# Patient Record
Sex: Male | Born: 1950
Health system: Southern US, Community
[De-identification: ages and names within clinical notes are randomized; demographics above are authoritative.]

## PROBLEM LIST (undated history)

## (undated) DIAGNOSIS — G90519 Complex regional pain syndrome I of unspecified upper limb: Secondary | ICD-10-CM

## (undated) DIAGNOSIS — D126 Benign neoplasm of colon, unspecified: Secondary | ICD-10-CM

## (undated) DIAGNOSIS — K5792 Diverticulitis of intestine, part unspecified, without perforation or abscess without bleeding: Secondary | ICD-10-CM

## (undated) DIAGNOSIS — F329 Major depressive disorder, single episode, unspecified: Secondary | ICD-10-CM

## (undated) DIAGNOSIS — H409 Unspecified glaucoma: Secondary | ICD-10-CM

## (undated) DIAGNOSIS — K5790 Diverticulosis of intestine, part unspecified, without perforation or abscess without bleeding: Secondary | ICD-10-CM

## (undated) DIAGNOSIS — IMO0002 Reserved for concepts with insufficient information to code with codable children: Secondary | ICD-10-CM

## (undated) DIAGNOSIS — T7840XA Allergy, unspecified, initial encounter: Secondary | ICD-10-CM

## (undated) DIAGNOSIS — F419 Anxiety disorder, unspecified: Secondary | ICD-10-CM

## (undated) DIAGNOSIS — N289 Disorder of kidney and ureter, unspecified: Secondary | ICD-10-CM

## (undated) DIAGNOSIS — F32A Depression, unspecified: Secondary | ICD-10-CM

## (undated) DIAGNOSIS — N200 Calculus of kidney: Secondary | ICD-10-CM

## (undated) DIAGNOSIS — K3189 Other diseases of stomach and duodenum: Secondary | ICD-10-CM

## (undated) DIAGNOSIS — E785 Hyperlipidemia, unspecified: Secondary | ICD-10-CM

## (undated) DIAGNOSIS — E119 Type 2 diabetes mellitus without complications: Secondary | ICD-10-CM

## (undated) DIAGNOSIS — Z8619 Personal history of other infectious and parasitic diseases: Secondary | ICD-10-CM

## (undated) DIAGNOSIS — K219 Gastro-esophageal reflux disease without esophagitis: Secondary | ICD-10-CM

## (undated) DIAGNOSIS — I10 Essential (primary) hypertension: Secondary | ICD-10-CM

## (undated) DIAGNOSIS — K31A0 Gastric intestinal metaplasia, unspecified: Secondary | ICD-10-CM

## (undated) HISTORY — DX: Hyperlipidemia, unspecified: E78.5

## (undated) HISTORY — DX: Diverticulosis of intestine, part unspecified, without perforation or abscess without bleeding: K57.90

## (undated) HISTORY — DX: Complex regional pain syndrome I of unspecified upper limb: G90.519

## (undated) HISTORY — DX: Benign neoplasm of colon, unspecified: D12.6

## (undated) HISTORY — DX: Other diseases of stomach and duodenum: K31.89

## (undated) HISTORY — DX: Gastro-esophageal reflux disease without esophagitis: K21.9

## (undated) HISTORY — DX: Personal history of other infectious and parasitic diseases: Z86.19

## (undated) HISTORY — DX: Allergy, unspecified, initial encounter: T78.40XA

## (undated) HISTORY — PX: ULNAR NERVE REPAIR: SHX2594

## (undated) HISTORY — DX: Gastric intestinal metaplasia, unspecified: K31.A0

## (undated) HISTORY — DX: Calculus of kidney: N20.0

## (undated) HISTORY — DX: Anxiety disorder, unspecified: F41.9

## (undated) HISTORY — DX: Reserved for concepts with insufficient information to code with codable children: IMO0002

## (undated) HISTORY — DX: Type 2 diabetes mellitus without complications: E11.9

## (undated) HISTORY — DX: Major depressive disorder, single episode, unspecified: F32.9

## (undated) HISTORY — DX: Unspecified glaucoma: H40.9

## (undated) HISTORY — DX: Depression, unspecified: F32.A

---

## 2002-09-25 HISTORY — PX: LITHOTRIPSY: SUR834

## 2006-12-25 DIAGNOSIS — IMO0002 Reserved for concepts with insufficient information to code with codable children: Secondary | ICD-10-CM

## 2006-12-25 HISTORY — DX: Reserved for concepts with insufficient information to code with codable children: IMO0002

## 2007-06-11 ENCOUNTER — Ambulatory Visit (HOSPITAL_COMMUNITY): Payer: Self-pay | Admitting: Psychiatry

## 2007-06-17 ENCOUNTER — Ambulatory Visit: Payer: Self-pay | Admitting: Family Medicine

## 2007-06-17 DIAGNOSIS — F329 Major depressive disorder, single episode, unspecified: Secondary | ICD-10-CM | POA: Insufficient documentation

## 2007-06-19 ENCOUNTER — Encounter: Payer: Self-pay | Admitting: Family Medicine

## 2007-06-24 ENCOUNTER — Encounter: Payer: Self-pay | Admitting: Family Medicine

## 2007-06-24 LAB — CONVERTED CEMR LAB
ALT: 20 units/L
AST: 18 units/L
Albumin: 4.2 g/dL
Alkaline Phosphatase: 87 units/L
BUN: 12 mg/dL
CO2: 24 meq/L
Chloride: 103 meq/L
Cholesterol: 181 mg/dL
Creatinine, Ser: 0.82 mg/dL
GFR calc non Af Amer: >60 mL/min
Glucose, Bld: 97 mg/dL
HDL: 30 mg/dL
PSA: 4.3 ng/mL
Potassium: 3.9 meq/L
Sodium: 140 meq/L
TSH: 2.54 microintl units/mL
Total Bilirubin: 0.8 mg/dL
Total Protein: 7.3 g/dL
Triglycerides: 407 mg/dL

## 2007-06-25 ENCOUNTER — Telehealth (INDEPENDENT_AMBULATORY_CARE_PROVIDER_SITE_OTHER): Payer: Self-pay | Admitting: *Deleted

## 2007-06-26 ENCOUNTER — Encounter: Payer: Self-pay | Admitting: Family Medicine

## 2007-06-28 ENCOUNTER — Ambulatory Visit: Payer: Self-pay | Admitting: Family Medicine

## 2007-06-28 DIAGNOSIS — E785 Hyperlipidemia, unspecified: Secondary | ICD-10-CM | POA: Insufficient documentation

## 2007-06-28 DIAGNOSIS — K7689 Other specified diseases of liver: Secondary | ICD-10-CM | POA: Insufficient documentation

## 2007-06-28 DIAGNOSIS — Q619 Cystic kidney disease, unspecified: Secondary | ICD-10-CM | POA: Insufficient documentation

## 2007-06-28 DIAGNOSIS — K760 Fatty (change of) liver, not elsewhere classified: Secondary | ICD-10-CM | POA: Insufficient documentation

## 2007-07-05 ENCOUNTER — Ambulatory Visit (HOSPITAL_COMMUNITY): Payer: Self-pay | Admitting: Psychiatry

## 2007-07-05 ENCOUNTER — Telehealth: Payer: Self-pay | Admitting: Family Medicine

## 2007-07-09 ENCOUNTER — Telehealth (INDEPENDENT_AMBULATORY_CARE_PROVIDER_SITE_OTHER): Payer: Self-pay | Admitting: *Deleted

## 2007-07-11 ENCOUNTER — Ambulatory Visit (HOSPITAL_COMMUNITY): Payer: Self-pay | Admitting: Psychiatry

## 2007-07-18 ENCOUNTER — Ambulatory Visit (HOSPITAL_COMMUNITY): Payer: Self-pay | Admitting: Psychiatry

## 2007-08-13 ENCOUNTER — Ambulatory Visit (HOSPITAL_COMMUNITY): Payer: Self-pay | Admitting: Psychiatry

## 2007-08-19 ENCOUNTER — Ambulatory Visit (HOSPITAL_COMMUNITY): Payer: Self-pay | Admitting: Psychiatry

## 2007-08-19 ENCOUNTER — Encounter: Payer: Self-pay | Admitting: Family Medicine

## 2007-08-20 LAB — CONVERTED CEMR LAB: PSA: 1.57 ng/mL (ref 0.10–4.00)

## 2007-09-11 ENCOUNTER — Ambulatory Visit (HOSPITAL_COMMUNITY): Payer: Self-pay | Admitting: Psychiatry

## 2007-10-09 ENCOUNTER — Ambulatory Visit (HOSPITAL_BASED_OUTPATIENT_CLINIC_OR_DEPARTMENT_OTHER): Admission: RE | Admit: 2007-10-09 | Discharge: 2007-10-09 | Payer: Self-pay | Admitting: Orthopedic Surgery

## 2007-10-23 ENCOUNTER — Ambulatory Visit (HOSPITAL_COMMUNITY): Payer: Self-pay | Admitting: Psychiatry

## 2008-01-14 ENCOUNTER — Ambulatory Visit: Payer: Self-pay | Admitting: Family Medicine

## 2008-01-14 DIAGNOSIS — J309 Allergic rhinitis, unspecified: Secondary | ICD-10-CM | POA: Insufficient documentation

## 2008-01-16 ENCOUNTER — Telehealth: Payer: Self-pay | Admitting: Family Medicine

## 2008-01-17 ENCOUNTER — Telehealth: Payer: Self-pay | Admitting: Family Medicine

## 2008-01-23 ENCOUNTER — Ambulatory Visit (HOSPITAL_COMMUNITY): Payer: Self-pay | Admitting: Psychiatry

## 2008-02-03 ENCOUNTER — Ambulatory Visit (HOSPITAL_COMMUNITY): Payer: Self-pay | Admitting: Psychology

## 2008-02-20 ENCOUNTER — Ambulatory Visit (HOSPITAL_COMMUNITY): Payer: Self-pay | Admitting: Psychiatry

## 2008-03-02 ENCOUNTER — Ambulatory Visit (HOSPITAL_COMMUNITY): Payer: Self-pay | Admitting: Psychology

## 2008-03-16 ENCOUNTER — Ambulatory Visit (HOSPITAL_COMMUNITY): Payer: Self-pay | Admitting: Psychology

## 2008-04-01 ENCOUNTER — Ambulatory Visit (HOSPITAL_COMMUNITY): Payer: Self-pay | Admitting: Psychology

## 2008-04-02 ENCOUNTER — Ambulatory Visit (HOSPITAL_COMMUNITY): Payer: Self-pay | Admitting: Psychiatry

## 2008-04-15 ENCOUNTER — Ambulatory Visit (HOSPITAL_COMMUNITY): Payer: Self-pay | Admitting: Psychology

## 2008-05-18 ENCOUNTER — Ambulatory Visit (HOSPITAL_COMMUNITY): Payer: Self-pay | Admitting: Psychology

## 2008-05-19 ENCOUNTER — Ambulatory Visit (HOSPITAL_COMMUNITY): Payer: Self-pay | Admitting: Psychiatry

## 2008-06-16 ENCOUNTER — Ambulatory Visit (HOSPITAL_COMMUNITY): Payer: Self-pay | Admitting: Psychology

## 2008-06-23 ENCOUNTER — Ambulatory Visit: Payer: Self-pay | Admitting: Family Medicine

## 2008-06-23 DIAGNOSIS — R51 Headache: Secondary | ICD-10-CM | POA: Insufficient documentation

## 2008-06-23 DIAGNOSIS — K219 Gastro-esophageal reflux disease without esophagitis: Secondary | ICD-10-CM | POA: Insufficient documentation

## 2008-06-23 DIAGNOSIS — R519 Headache, unspecified: Secondary | ICD-10-CM | POA: Insufficient documentation

## 2008-06-24 ENCOUNTER — Encounter: Payer: Self-pay | Admitting: Family Medicine

## 2008-06-25 LAB — CONVERTED CEMR LAB
ALT: 24 units/L (ref 0–53)
AST: 24 units/L (ref 0–37)
Albumin: 4.3 g/dL (ref 3.5–5.2)
Alkaline Phosphatase: 101 units/L (ref 39–117)
BUN: 12 mg/dL (ref 6–23)
CO2: 24 meq/L (ref 19–32)
Calcium: 9.2 mg/dL (ref 8.4–10.5)
Chloride: 103 meq/L (ref 96–112)
Cholesterol, target level: 200 mg/dL
Cholesterol: 209 mg/dL — ABNORMAL HIGH (ref 0–200)
Creatinine, Ser: 0.95 mg/dL (ref 0.40–1.50)
Glucose, Bld: 96 mg/dL (ref 70–99)
HCT: 48.2 % (ref 39.0–52.0)
HDL goal, serum: 40 mg/dL
HDL: 34 mg/dL — ABNORMAL LOW (ref >39)
Hemoglobin: 16.9 g/dL (ref 13.0–17.0)
LDL Cholesterol: 104 mg/dL — ABNORMAL HIGH (ref 0–99)
LDL Goal: 130 mg/dL
MCHC: 35.1 g/dL (ref 30.0–36.0)
MCV: 88.4 fL (ref 78.0–100.0)
PSA: 1.07 ng/mL (ref 0.10–4.00)
Platelets: 179 10*3/uL (ref 150–400)
Potassium: 4.3 meq/L (ref 3.5–5.3)
RBC: 5.45 M/uL (ref 4.22–5.81)
RDW: 15.4 % (ref 11.5–15.5)
Sodium: 140 meq/L (ref 135–145)
TSH: 2.121 microintl units/mL (ref 0.350–4.50)
Total Bilirubin: 1.2 mg/dL (ref 0.3–1.2)
Total CHOL/HDL Ratio: 6.1
Total Protein: 7 g/dL (ref 6.0–8.3)
Triglycerides: 357 mg/dL — ABNORMAL HIGH (ref <150)
VLDL: 71 mg/dL — ABNORMAL HIGH (ref 0–40)
WBC: 10.2 10*3/uL (ref 4.0–10.5)

## 2008-07-03 ENCOUNTER — Ambulatory Visit: Payer: Self-pay | Admitting: Family Medicine

## 2008-07-15 ENCOUNTER — Encounter: Admission: RE | Admit: 2008-07-15 | Discharge: 2008-07-15 | Payer: Self-pay | Admitting: Family Medicine

## 2008-07-15 ENCOUNTER — Ambulatory Visit: Payer: Self-pay | Admitting: Family Medicine

## 2008-07-15 ENCOUNTER — Ambulatory Visit (HOSPITAL_COMMUNITY): Payer: Self-pay | Admitting: Psychiatry

## 2008-07-19 ENCOUNTER — Encounter: Admission: RE | Admit: 2008-07-19 | Discharge: 2008-07-19 | Payer: Self-pay | Admitting: Family Medicine

## 2008-08-07 ENCOUNTER — Ambulatory Visit (HOSPITAL_COMMUNITY): Payer: Self-pay | Admitting: Psychiatry

## 2008-11-06 ENCOUNTER — Ambulatory Visit (HOSPITAL_COMMUNITY): Payer: Self-pay | Admitting: Psychiatry

## 2009-03-16 ENCOUNTER — Ambulatory Visit (HOSPITAL_COMMUNITY): Payer: Self-pay | Admitting: Psychiatry

## 2009-04-19 ENCOUNTER — Ambulatory Visit: Payer: Self-pay | Admitting: Family Medicine

## 2009-04-23 ENCOUNTER — Encounter: Admission: RE | Admit: 2009-04-23 | Discharge: 2009-04-23 | Payer: Self-pay | Admitting: Family Medicine

## 2009-05-04 ENCOUNTER — Ambulatory Visit (HOSPITAL_COMMUNITY): Payer: Self-pay | Admitting: Psychology

## 2009-05-11 ENCOUNTER — Ambulatory Visit (HOSPITAL_COMMUNITY): Payer: Self-pay | Admitting: Psychiatry

## 2009-05-22 ENCOUNTER — Ambulatory Visit: Payer: Self-pay | Admitting: Family Medicine

## 2009-05-22 DIAGNOSIS — N200 Calculus of kidney: Secondary | ICD-10-CM | POA: Insufficient documentation

## 2009-05-22 LAB — CONVERTED CEMR LAB
Bilirubin Urine: NEGATIVE
Glucose, Urine, Semiquant: NEGATIVE
Ketones, urine, test strip: NEGATIVE
Nitrite: NEGATIVE
Protein, U semiquant: NEGATIVE
Specific Gravity, Urine: 1.01
Urobilinogen, UA: 1
WBC Urine, dipstick: NEGATIVE
pH: 7

## 2009-05-24 ENCOUNTER — Encounter: Payer: Self-pay | Admitting: Family Medicine

## 2009-05-25 ENCOUNTER — Ambulatory Visit (HOSPITAL_COMMUNITY): Payer: Self-pay | Admitting: Psychology

## 2009-06-14 ENCOUNTER — Ambulatory Visit (HOSPITAL_COMMUNITY): Payer: Self-pay | Admitting: Psychology

## 2009-06-22 ENCOUNTER — Ambulatory Visit (HOSPITAL_COMMUNITY): Payer: Self-pay | Admitting: Psychiatry

## 2009-08-04 ENCOUNTER — Encounter: Payer: Self-pay | Admitting: Family Medicine

## 2009-08-10 ENCOUNTER — Ambulatory Visit: Payer: Self-pay | Admitting: Family Medicine

## 2009-08-10 ENCOUNTER — Encounter: Admission: RE | Admit: 2009-08-10 | Discharge: 2009-08-10 | Payer: Self-pay | Admitting: Family Medicine

## 2009-08-10 DIAGNOSIS — G589 Mononeuropathy, unspecified: Secondary | ICD-10-CM | POA: Insufficient documentation

## 2009-08-11 ENCOUNTER — Encounter: Payer: Self-pay | Admitting: Family Medicine

## 2009-08-11 LAB — CONVERTED CEMR LAB
ALT: 23 units/L (ref 0–53)
AST: 21 units/L (ref 0–37)
Albumin: 4.7 g/dL (ref 3.5–5.2)
Alkaline Phosphatase: 112 units/L (ref 39–117)
BUN: 17 mg/dL (ref 6–23)
CO2: 25 meq/L (ref 19–32)
Calcium: 10 mg/dL (ref 8.4–10.5)
Chloride: 102 meq/L (ref 96–112)
Cholesterol: 255 mg/dL — ABNORMAL HIGH (ref 0–200)
Creatinine, Ser: 0.88 mg/dL (ref 0.40–1.50)
Direct LDL: 127 mg/dL — ABNORMAL HIGH
Folate: 15.4 ng/mL
Glucose, Bld: 98 mg/dL (ref 70–99)
HCT: 50 % (ref 39.0–52.0)
HDL: 38 mg/dL — ABNORMAL LOW (ref >39)
Hemoglobin: 16.9 g/dL (ref 13.0–17.0)
MCHC: 33.8 g/dL (ref 30.0–36.0)
MCV: 89.3 fL (ref 78.0–100.0)
Platelets: 204 10*3/uL (ref 150–400)
Potassium: 4.4 meq/L (ref 3.5–5.3)
RBC: 5.6 M/uL (ref 4.22–5.81)
RDW: 14 % (ref 11.5–15.5)
Sodium: 140 meq/L (ref 135–145)
TSH: 1.29 microintl units/mL (ref 0.350–4.500)
Total Bilirubin: 0.9 mg/dL (ref 0.3–1.2)
Total CHOL/HDL Ratio: 6.7
Total Protein: 7.8 g/dL (ref 6.0–8.3)
Triglycerides: 561 mg/dL — ABNORMAL HIGH (ref <150)
Vit D, 25-Hydroxy: 15 ng/mL — ABNORMAL LOW (ref 30–89)
Vitamin B-12: 717 pg/mL (ref 211–911)
WBC: 9.2 10*3/uL (ref 4.0–10.5)

## 2009-09-01 ENCOUNTER — Encounter: Payer: Self-pay | Admitting: Family Medicine

## 2009-09-25 HISTORY — PX: ROTATOR CUFF REPAIR: SHX139

## 2009-10-19 ENCOUNTER — Encounter: Payer: Self-pay | Admitting: Family Medicine

## 2009-11-01 ENCOUNTER — Encounter: Payer: Self-pay | Admitting: Family Medicine

## 2009-11-02 ENCOUNTER — Encounter: Payer: Self-pay | Admitting: Family Medicine

## 2009-11-03 LAB — CONVERTED CEMR LAB: PSA: 0.99 ng/mL (ref 0.10–4.00)

## 2010-04-29 ENCOUNTER — Ambulatory Visit (HOSPITAL_COMMUNITY): Payer: Self-pay | Admitting: Psychiatry

## 2010-05-10 ENCOUNTER — Ambulatory Visit: Payer: Self-pay | Admitting: Family Medicine

## 2010-05-10 LAB — CONVERTED CEMR LAB
Bilirubin Urine: NEGATIVE
Blood in Urine, dipstick: NEGATIVE
Glucose, Urine, Semiquant: NEGATIVE
Ketones, urine, test strip: NEGATIVE
Nitrite: NEGATIVE
Specific Gravity, Urine: 1.025
Urobilinogen, UA: 1
WBC Urine, dipstick: NEGATIVE
pH: 6.5

## 2010-05-11 ENCOUNTER — Telehealth: Payer: Self-pay | Admitting: Family Medicine

## 2010-05-11 ENCOUNTER — Encounter: Payer: Self-pay | Admitting: Family Medicine

## 2010-05-11 DIAGNOSIS — D649 Anemia, unspecified: Secondary | ICD-10-CM | POA: Insufficient documentation

## 2010-05-11 LAB — CONVERTED CEMR LAB
ALT: 20 units/L (ref 0–53)
AST: 21 units/L (ref 0–37)
Albumin: 4.5 g/dL (ref 3.5–5.2)
Alkaline Phosphatase: 91 units/L (ref 39–117)
BUN: 14 mg/dL (ref 6–23)
Basophils Absolute: 0.1 10*3/uL (ref 0.0–0.1)
Basophils Absolute: 0.2 10*3/uL — ABNORMAL HIGH (ref 0.0–0.1)
Basophils Relative: 1 % (ref 0–1)
Basophils Relative: 2 % — ABNORMAL HIGH (ref 0–1)
CO2: 27 meq/L (ref 19–32)
Calcium: 9.7 mg/dL (ref 8.4–10.5)
Chloride: 101 meq/L (ref 96–112)
Creatinine, Ser: 0.93 mg/dL (ref 0.40–1.50)
Eosinophils Absolute: 0.4 10*3/uL (ref 0.0–0.7)
Eosinophils Absolute: 0.3 10*3/uL (ref 0.0–0.7)
Eosinophils Relative: 4 % (ref 0–5)
Eosinophils Relative: 3 % (ref 0–5)
Glucose, Bld: 102 mg/dL — ABNORMAL HIGH (ref 70–99)
HCT: 15.9 % — ABNORMAL LOW (ref 39.0–52.0)
HCT: 50.2 % (ref 39.0–52.0)
Hemoglobin: 5 g/dL — CL (ref 13.0–17.0)
Hemoglobin: 17 g/dL (ref 13.0–17.0)
Lymphocytes Relative: 28 % (ref 12–46)
Lymphocytes Relative: 27 % (ref 12–46)
Lymphs Abs: 2.8 10*3/uL (ref 0.7–4.0)
Lymphs Abs: 2.5 10*3/uL (ref 0.7–4.0)
MCHC: 31.4 g/dL (ref 30.0–36.0)
MCHC: 33.9 g/dL (ref 30.0–36.0)
MCV: 98.1 fL (ref 78.0–100.0)
MCV: 88.4 fL (ref 78.0–100.0)
Monocytes Absolute: 1.1 10*3/uL — ABNORMAL HIGH (ref 0.1–1.0)
Monocytes Absolute: 0.4 10*3/uL (ref 0.1–1.0)
Monocytes Relative: 11 % (ref 3–12)
Monocytes Relative: 4 % (ref 3–12)
Neutro Abs: 5.8 10*3/uL (ref 1.7–7.7)
Neutro Abs: 6 10*3/uL (ref 1.7–7.7)
Neutrophils Relative %: 57 % (ref 43–77)
Neutrophils Relative %: 64 % (ref 43–77)
Platelets: 539 10*3/uL — ABNORMAL HIGH (ref 150–400)
Platelets: 196 10*3/uL (ref 150–400)
Potassium: 4.7 meq/L (ref 3.5–5.3)
RBC: 1.62 M/uL — ABNORMAL LOW (ref 4.22–5.81)
RBC: 5.68 M/uL (ref 4.22–5.81)
RDW: 13.3 % (ref 11.5–15.5)
RDW: 13.7 % (ref 11.5–15.5)
Sodium: 138 meq/L (ref 135–145)
Total Bilirubin: 1.8 mg/dL — ABNORMAL HIGH (ref 0.3–1.2)
Total Protein: 7.6 g/dL (ref 6.0–8.3)
WBC: 10.1 10*3/uL (ref 4.0–10.5)
WBC: 9.4 10*3/uL (ref 4.0–10.5)

## 2010-05-13 ENCOUNTER — Encounter: Admission: RE | Admit: 2010-05-13 | Discharge: 2010-05-13 | Payer: Self-pay | Admitting: Family Medicine

## 2010-06-30 ENCOUNTER — Ambulatory Visit (HOSPITAL_COMMUNITY): Payer: Self-pay | Admitting: Psychiatry

## 2010-08-02 ENCOUNTER — Ambulatory Visit: Payer: Self-pay | Admitting: Family Medicine

## 2010-08-02 DIAGNOSIS — I10 Essential (primary) hypertension: Secondary | ICD-10-CM | POA: Insufficient documentation

## 2010-08-02 DIAGNOSIS — G5640 Causalgia of unspecified upper limb: Secondary | ICD-10-CM | POA: Insufficient documentation

## 2010-10-03 ENCOUNTER — Ambulatory Visit (HOSPITAL_COMMUNITY)
Admission: RE | Admit: 2010-10-03 | Discharge: 2010-10-03 | Payer: Self-pay | Source: Home / Self Care | Attending: Psychiatry | Admitting: Psychiatry

## 2010-10-16 ENCOUNTER — Encounter: Payer: Self-pay | Admitting: Family Medicine

## 2010-10-25 NOTE — Letter (Signed)
Summary: [facility]  [facility]   Imported By: Lanelle Bal 10/25/2009 08:48:51  _____________________________________________________________________  External Attachment:    Type:   Image     Comment:   External Document

## 2010-10-25 NOTE — Progress Notes (Signed)
Summary: COLONOSCOPY SCHEDULED  Phone Note Outgoing Call   Call placed by: Drue Stager RN,  July 05, 2007 12:50 PM Action Taken: Appt scheduled, Information Sent Summary of Call: SCREENING COLONOSCOPY SCHEDULED WITH [facility] FOR OCT. 23. LEFT MESSAGE ON MACHINE FOR PATIENT.  Initial call taken by: Drue Stager RN,  July 05, 2007 12:51 PM

## 2010-10-25 NOTE — Progress Notes (Signed)
Summary: REFFERAL  Phone Note Call from Patient Call back at Home Phone [phone]   Caller: PATIENT SPOUSE Call For: NURSE Reason for Call: Referral Summary of Call: PT  WIFE CALLED RE PT COLONOSCOPY APP. Initial call taken by: Sarina Ill,  July 09, 2007 3:33 PM  Follow-up for Phone Call        SPOKE WITH PATIENT AND HE INFORMED HIS WIFE ALREADY ABOUT THE INFORMATION ABOUT HIS COLONOSCOPY APPT. Follow-up by: Harlene Salts,  July 10, 2007 9:09 AM

## 2010-10-25 NOTE — Procedures (Signed)
Summary: Colonoscopy/[facility]  Colonoscopy/[facility]   Imported By: Lanelle Bal 11/05/2009 12:20:50  _____________________________________________________________________  External Attachment:    Type:   Image     Comment:   External Document

## 2010-10-25 NOTE — Letter (Signed)
Summary: [facility]  [facility]   Imported By: Lanelle Bal 09/08/2009 09:53:53  _____________________________________________________________________  External Attachment:    Type:   Image     Comment:   External Document

## 2010-10-25 NOTE — Assessment & Plan Note (Signed)
Summary: CPE, Chest pain, HA, etc    Vital Signs:  Patient Profile:   60 Years [name] Height:     66.1 inches Weight:      176 pounds Pulse rate:   66 / minute BP sitting:   123 / 68  (left arm) Cuff size:   regular  Vitals Entered By: [name] (June 23, 2008 9:36 AM)                 PCP:  Cipriano Bunker  Chief Complaint:  CPE.  History of Present Illness: Has lost 5 pounds. Updated me about his elbow surgery. Thinks have not gone well and now has RLS. On neurontin for the pain and tramadol. Also has been more depressed and anxous as has been out of work since Jan.,   a few days ago started have pain on the left lower abdomen.  Saw Dr. Earlene Plater his URologist. They ordered a CT scan to rule out stones.  No stones seen but did have an infection.  Put on Cipro and today is last dose of the antibiotic. Pain is better.    Chest pain near left pectoral. Occurs about 2 times per week.  Last about 5 minutes. Describes as a pressure senstation. Usually occurs at night when laying down to go to bed. No SOB or diaphoresis. No hx of cardiac dz.   Has been getting a fullness in his neck and incraesed belching, says he feels it is reflux.  Took an alkeseltzer last night and it did seem to help.   HAs almost daily since increased his dose of the Zoloft.  Usually on the top of his head and occ radiates towards his ears. No change in vision.  Glasses are up to date. Happen almost daily. Seem worse at the end of the  day.     Current Allergies: No known allergies   Past Medical History:    Hx recurrent Kidney stones    Wears glasses.     Torn ligament in the left elbow.     Sees Psych - Dr. Christell Constant.   Past Surgical History:    Right knee arthroscopy 11-06    lithotripsy 10-04    Cystoscopy 10-04    Lithotripsy for Kidney stones 9-08    Left elbow surgery 2009 - now has RSD.    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  4 years college.  Married to [name] with 4 children.         Former Smoker       Alcohol use-yes       Drug use-no       Regular exercise-no   Risk Factors:  Caffeine use:  2 drinks per day   Review of Systems       The patient complains of headaches, abdominal pain, and depression.  The patient denies anorexia, fever, weight loss, weight gain, vision loss, decreased hearing, hoarseness, chest pain, syncope, dyspnea on exertion, peripheral edema, prolonged cough, hemoptysis, melena, hematochezia, severe indigestion/heartburn, hematuria, incontinence, genital sores, muscle weakness, suspicious skin lesions, transient blindness, difficulty walking, unusual weight change, abnormal bleeding, enlarged lymph nodes, and  breast masses.     Physical Exam  General:     Well-developed,well-nourished,in no acute distress; alert,appropriate and cooperative throughout examination Head:     Normocephalic and atraumatic without obvious abnormalities. No apparent alopecia or balding. Eyes:     No corneal or conjunctival inflammation noted. EOMI. Perrla. Funduscopic exam benign, without hemorrhages, exudates or papilledema. Vision grossly normal. Ears:     External ear exam shows no significant lesions or deformities.  Otoscopic examination reveals clear canals, tympanic membranes are intact bilaterally without bulging, retraction, inflammation or discharge. Hearing is grossly normal bilaterally. Nose:     External nasal examination shows no deformity or inflammation.  Mouth:     Oral mucosa and oropharynx without lesions or exudates.  Teeth in good repair. Neck:     No deformities, masses, or tenderness noted. Chest Wall:     No deformities, masses, tenderness or gynecomastia noted. Breasts:      no gynecomastia.   Lungs:     Normal respiratory effort, chest expands symmetrically. Lungs are clear to auscultation, no crackles or wheezes. Heart:     Normal rate and regular rhythm. S1 and S2 normal without gallop, murmur, click, rub or other extra sounds. Abdomen:     Tender in mid epigastrum and [name] towards umbilicus. soft, normal bowel sounds, no distention, no masses, no hepatomegaly, and no splenomegaly.   Prostate:     Deferred as had exam by Urology.  Pulses:     R and L carotid,radial,dorsalis pedis and posterior tibial pulses are full and equal bilaterally Extremities:     No clubbing, cyanosis, edema, or deformity noted with normal full range of motion of all joints.   Neurologic:     No cranial nerve deficits noted. Station and gait are normal. DTRs are symmetrical throughout. Sensory, motor and coordinative functions appear intact. Skin:     no rashes.   Cervical Nodes:     No lymphadenopathy noted Psych:     Cognition and judgment appear intact. Alert and cooperative with normal attention span and concentration. No apparent delusions, illusions, hallucinations    Impression & Recommendations:  Problem # 1:  EXAMINATION, ROUTINE MEDICAL (ICD-V70.0) Exam is normal.  Screening labs.  Encourage regular exercise. Praised hhim on his weight loss.  Orders: T-Comprehensive Metabolic Panel [id number]) T-Lipid Profile [id number]) T-PSA  ([id number])   Problem # 2:  CHEST PAIN, ATYPICAL (ICD-786.59) Discussed may be realted to his arm pain but will check thyroid and CBC.  EKG shows NSR , rate 58bpm, no acute changes Will also treat with PPI. If no improvement set up for CXR adn consider cardiac stress test.  Orders: T-TSH ([id number]) T-CBC No Diff ([id number]) EKG w/ Interpretation (93000)   Problem # 3:  ABDOMINAL PAIN, LEFT LOWER QUADRANT (ICD-789.04) Discussed unclear etiology.  HAd CT san with Urology and had abdominal CT in 2008 that only showed  diverticulitis. He is feeling some better since completing his ABX. Call if not resolvingin 1 week.   Problem # 4:  HEADACHE (ICD-784.0) Discussed talking with Dr. Christell Constant about reducing his dose of Celexa to see if the HA improve. If they don't improve then recommend return to clinic for futher evaluation.  His updated medication list for this problem includes:    Tramadol Hcl 50 Mg Tabs (Tramadol hcl) .[name]... For headaches.   Problem # 5:  GERD (ICD-530.81) Discussed trial of PPI and avoid greasy, spicy, foods. CAffine, alcohol, and carbonated beverages. FU in one month. Call if med helping  or not helping. Samples given.   Complete Medication List: 1)  Zoloft 100 Mg Tabs (Sertraline hcl) .... Take 1 tablet by mouth two times a day 2)  Klonopin 0.5 Mg Tabs (Clonazepam) .... 2 tabs daily 3)  Niaspan 750 Mg Tbcr (Niacin (antihyperlipidemic)) .... Take 1 tablet by mouth once a day 4)  Fish Oil 1000 Mg Caps (Omega-3 fatty acids) .... 2 tabs with each meal 5)  Allegra 180 Mg Tabs (Fexofenadine hcl) .... Take 1 tablet by mouth once a day 6)  Patanol 0.1 % Soln (Olopatadine hcl) .[name].. 1 drop in each eye two times a day 7)  Singulair 10 Mg Tabs (Montelukast sodium) .... Take 1 tablet by mouth once a day 8)  Neurontin 250 Mg/69ml Soln (Gabapentin) .... Take 1 tablet by mouth three times a day 9)  Tramadol Hcl 50 Mg Tabs (Tramadol hcl) .... For headaches.    ]

## 2010-10-25 NOTE — Assessment & Plan Note (Signed)
Summary: NOV; CPE   Vital Signs:  Patient Profile:   60 Years Old Male Height:     66.1 inches Weight:      178 pounds Pulse rate:   68 / minute BP sitting:   137 / 78  (left arm) Cuff size:   regular  Vitals Entered By: [name] (June 17, 2007 3:43 PM)                 PCP:  Cipriano Bunker  Chief Complaint:  Physical with Bloodwork. New Pt. Had Lithotripsy for kidney stones on September 5 and MD told him last Friday at F/U appt. he had Diverticulitis and started him on Cipro 500mg  1 tablet twice a day x 10days.[name]  History of Present Illness: Had lithotripsy of kidney stones previously but was still having pain. Was noted to have diverticulosis so given Cipro.  Has  been on it for 2 days.  Has recently been passing stones. Says he is starting to feel better.   Taking Zoloft- started Last week.  Previously on Cymbalta.  Sees. Dr. Einar Grad. On Xanax for 2-3 years.  Right now this is what he is strugglig with the most  He also has an appt with the counselor downstairs.  Work is very stressful for him.   Last tetanus was more than 10 years. Needs to be updated.   Bilateral chronic Knee pain secondary to OA.  Sees [facility].  They have now recommend knee replacement. Has had Simvisc injections.   Current Allergies: No known allergies   Past Medical History:    Hx recurrent Kidney stones    Wears glasses.   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      Former Smoker    Alcohol use-yes    Drug use-no    Regular exercise-no   Risk Factors:  Tobacco use:  quit    Year quit:  1984 Drug use:  no Alcohol use:  yes    Drinks per day:  4 Exercise:  no  Family  History Risk Factors:    Family History of MI in females < 18 years old:  no    Family History of MI in males < 5 years old:  no   Review of Systems       No fever/sweats/weakness, unexplained weight loss/gain.  No vison changes.  No difficulty hearing/ringing in ears, hay fever/allergies.  No chest pain/discomfort, palpitations.  No Br lump/nipple discharge.  No cough/wheeze.  No blood in BM, nausea/vomiting/diarrhea.  No nighttime urination, leaking urine, unusual vaginal bleeding, discharge (penis or vagina).  No muscle/joint pain. No rash, change in mole.  No HA, memory loss.  + anxiety, sleep d/o, depression.  No easy bruising/bleeding, unexplained lump    Physical Exam  General:     Well-developed,well-nourished,in no acute distress; alert,appropriate and cooperative throughout examination Head:     Normocephalic and atraumatic without obvious abnormalities. No apparent alopecia or balding. Eyes:     No corneal or conjunctival inflammation noted. EOMI. Perrla.  Ears:     External ear exam shows no significant lesions or deformities.  Otoscopic examination reveals clear canals, tympanic membranes are intact bilaterally without bulging, retraction, inflammation or discharge. Hearing is grossly normal bilaterally. Nose:     External nasal examination shows no deformity or inflammation.  Mouth:     Oral mucosa and oropharynx without lesions or exudates.  Teeth in good repair.  Several teeth missing.  Neck:     No deformities, masses, or tenderness noted. Lungs:     Normal respiratory effort, chest expands symmetrically. Lungs are clear to auscultation, no crackles or wheezes. Heart:     Normal rate and regular rhythm. S1 and S2 normal without gallop, murmur, click, rub or other extra sounds. Abdomen:     Bowel sounds positive,abdomen soft and non-tender without masses, organomegaly or hernias noted. Msk:     No deformity or scoliosis noted of thoracic or lumbar spine.   Pulses:      R and L carotid,radial,dorsalis pedis and posterior tibial pulses are full and equal bilaterally Extremities:     No clubbing, cyanosis, edema, or deformity noted with normal full range of motion of all joints.   Neurologic:     No cranial nerve deficits noted. Station and gait are normal.  DTRs are symmetrical throughout. Sensory, motor and coordinative functions appear intact. Skin:     no rashes.   Cervical Nodes:     No lymphadenopathy noted Psych:     Cognition and judgment appear intact. Alert and cooperative with normal attention span and concentration. No apparent delusions, illusions, hallucinations    Impression & Recommendations:  Problem # 1:  Preventive Health Care (ICD-V70.0) Exam is normal Tetanus was updated.  Screening labs. Discussed options for colon Cancer screening.  Pt wants to think about options and will call and let us know.  BP elevated, keep eye on it.  should have it checked twice yearly.   Eye glass prescription is up to date.   Problem # 2:  DISORDER, DEPRESSIVE NEC (ICD-311) Has recently  seen Dr. Christell Constant and htey have made some medciation changes.  Also has an appt  for counseling.  His updated medication list for this problem includes:    Zoloft 25 Mg Tabs (Sertraline hcl) .[name]... Take one tablet by mouth once a day    Alprazolam 0.5 Mg Tabs (Alprazolam) .[name]... Take one tablet by mouth as needed for anxiety   Complete Medication List: 1)  Zoloft 25 Mg Tabs (Sertraline hcl) .... Take one tablet by mouth once a day 2)  Alprazolam 0.5 Mg Tabs (Alprazolam) .... Take one tablet by mouth as needed for anxiety 3)  Ciprofloxacin Hcl 500 Mg Tabs (Ciprofloxacin hcl) .... Take one tablet by mouth twice a day for 10 days  Other Orders: T-Comprehensive Metabolic Panel [id number]) T-Lipid Profile [id number]) T-PSA  ([id number]) Tdap => 26yrs IM (57846) Admin 1st Vaccine (96295)     ]  Tetanus/Td Vaccine    Vaccine Type: Tdap    Site: left deltoid     Mfr: Sanofi    Dose: 0.5 ml    Route: IM    Given by: [name]    Exp. Date: 06/12/2009    Lot #: [id number]    VIS given: 04/05/05 version given June 17, 2007.

## 2010-10-25 NOTE — Assessment & Plan Note (Signed)
Summary: [name] pain   Vital Signs:  Patient profile:   60 year old male Height:      66.1 inches Weight:      175 pounds BMI:     28.26 Pulse rate:   88 / minute BP sitting:   114 / 73  (left arm) Cuff size:   regular  Vitals Entered By: Avon Gully CMA, Duncan Dull) (May 10, 2010 10:47 AM) CC: LUQ pain x 3 days, feels pressure in the area, hurts worse in the am, felt relief upon urination Pain Assessment Patient in pain? yes     Location: LUQ Intensity: 4 Type: pressure   Primary Care Provider:  Jamala Kohen, C  CC:  LUQ pain x 3 days, feels pressure in the area, hurts worse in the am, and felt relief upon urination.  History of Present Illness: LUQ pain x 3 days, feels pressure in the area, hurts worse in the am, felt relief upon defecation. Pian has ben intermittan for 2 months.  + hx of kidney sotnes.  Pain on the left side and radiating around to his back .  Went to Urology for evlaution as he thought it kidney stones. Had an xray and neg prostate  exam.  Pain has been constant the last few days.  Feels like a  pressue.  Felt better after BM this am.  More constiapted a few days ago. No fever or chilld.  Felt a little dizzy yesterday. Recently just startd zoloft again. Painis mostly in the [name] today. No blood in the stool. Did take naproxen two times a day yesterday and does feel some better today.   Current Medications (verified): 1)  Zoloft 200 Mg Tabs (Sertraline Hcl) .... Take 1 Tablet By Mouthone Time A Day 2)  Klonopin 0.25 Mg Tabs (Clonazepam) .... At Night 3)  Fish Oil 1000 Mg  Caps (Omega-3 Fatty Acids) .... 2 Tabs By Mouth Two Times A Day 4)  Tramadol Hcl 50 Mg Tabs (Tramadol Hcl) .... For Headaches. 5)  Multivitamins  Tabs (Multiple Vitamin) .... Take One Tablet By Mouth Daily  Allergies (verified): No Known Drug Allergies  Comments:  Nurse/Medical Assistant: The patient's medications and allergies were reviewed with the patient and were updated in the  Medication and Allergy Lists. Avon Gully CMA, Duncan Dull) (May 10, 2010 10:50 AM)  Past History:  Past Medical History: Last updated: 08/10/2009 Hx recurrent Kidney stones Wears glasses.  Torn ligament in the left elbow.  Sees Psych - Dr. Christell Constant.  lithotripsy 10-04 Cystoscopy 10-04 Lithotripsy for Kidney stones 9-08  Physical Exam  General:  Well-developed,well-nourished,in no acute distress; alert,appropriate and cooperative throughout examination Lungs:  Normal respiratory effort, chest expands symmetrically. Lungs are clear to auscultation, no crackles or wheezes. Heart:  Normal rate and regular rhythm. S1 and S2 normal without gallop, murmur, click, rub or other extra sounds. Abdomen:  soft, normal bowel sounds, no distention, no masses, no guarding, no hepatomegaly, and no splenomegaly.  Tender in the [name] with deep palpation.    Impression & Recommendations:  Problem # 1:  ABDOMINAL PAIN, LEFT LOWER QUADRANT (ICD-789.04) Based on exam and history I am concerned about possible diverticulitis, thought he has not had a fever. Will check CBC and see if WBC elevated. If so then will treat as presumed diverticultis. If neg or not getting better then consider abdoinal CT for further evaluation.  Orders: UA Dipstick w/o Micro (automated)  (81003) T-CBC w/Diff ([id number]) T-Comprehensive Metabolic Panel ([id number])  Complete Medication List: 1)  Zoloft 200 Mg Tabs (sertraline Hcl)  .... Take 1 tablet by mouthone time a day 2)  Klonopin 0.25 Mg Tabs (clonazepam)  .... At night 3)  Fish Oil 1000 Mg Caps (Omega-3 fatty acids) .... 2 tabs by mouth two times a day 4)  Tramadol Hcl 50 Mg Tabs (Tramadol hcl) .... For headaches. 5)  Naproxen 500 Mg Tabs (Naproxen) .... As directed 6)  Multivitamins Tabs (Multiple vitamin) .... Take one tablet by mouth daily 7)  Zoloft 100 Mg Tabs (Sertraline hcl) .... Take 1 tablet by mouth once a day at bedtime 8)  Clonazepam 0.25 Mg Tbdp  (Clonazepam)  Patient Instructions: 1)  We will call you with the lab results.   Laboratory Results   Urine Tests  Date/Time Received: 05/10/10 Date/Time Reported: 05/10/10  Routine Urinalysis   Color: yellow Appearance: Clear Glucose: negative   (Normal Range: Negative) Bilirubin: negative   (Normal Range: Negative) Ketone: negative   (Normal Range: Negative) Spec. Gravity: 1.025   (Normal Range: 1.003-1.035) Blood: negative   (Normal Range: Negative) pH: 6.5   (Normal Range: 5.0-8.0) Protein: trace   (Normal Range: Negative) Urobilinogen: 1.0   (Normal Range: 0-1) Nitrite: negative   (Normal Range: Negative) Leukocyte Esterace: negative   (Normal Range: Negative)

## 2010-10-25 NOTE — Progress Notes (Signed)
Summary: Information on blood transfusion type and cross match  Phone Note Outgoing Call   Call placed by: [name],  May 11, 2010 10:42 AM Call placed to: Patient Summary of Call: Patient scheduled to have a blood transfusion at [facility] Stay Thursday 05/12/2010 @ 9:30am. Pt informed of this appt. Also informed pt needs to go to [facility] Admitting today before 4 to be typed and cross matched. Faxed order to short stay. Initial call taken by: [name],  May 11, 2010 10:45 AM

## 2010-10-25 NOTE — Letter (Signed)
Summary: [facility]  [facility]   Imported By: Lester [place] 08/13/2009 08:08:09  _____________________________________________________________________  External Attachment:    Type:   Image     Comment:   External Document

## 2010-10-25 NOTE — Assessment & Plan Note (Signed)
Summary: VISIT   Vital Signs:  Patient Profile:   60 Years Old Male CC:      kidney stones Height:     66.1 inches Weight:      174 pounds O2 Sat:      96 % O2 treatment:    Room Air Temp:     97.0 degrees F oral Pulse rate:   58 / minute Resp:     16 per minute BP sitting:   132 / 83  (left arm) Cuff size:   regular  Pt. in pain?   yes    Location:   rt flank    Intensity:   2    Type:       sharp  Vitals Entered By: Angie Fava (May 22, 2009 9:37 AM)                   Updated Prior Medication List: * ZOLOFT 200 MG TABS (SERTRALINE HCL) Take 1 tablet by mouthone time a day * KLONOPIN 0.25 MG TABS (CLONAZEPAM) at night FISH OIL 1000 MG  CAPS (OMEGA-3 FATTY ACIDS) 2 tabs with each meal TRAMADOL HCL 50 MG TABS (TRAMADOL HCL) For headaches. * TOPAMAX 300 MG TABS (TOPIRAMATE) once a day LAMICTAL STARTER 25 (35) MG KIT (LAMOTRIGINE) as directed NAPROXEN 500 MG TABS (NAPROXEN) as directed  Current Allergies: No known allergies History of Present Illness Chief Complaint: kidney stones History of Present Illness: Subjective:  Patient complains of awakening at 6AM today with right flank pain and nausea (no vomiting).  The pain migrated to his right abdomen, and while in the waiting room here, suddenly dissipated.  He is now assymptomatic.   He has had several kidney stones over the past several years and today's episode was classic.  No fevers, chills, and sweats.  He has felt well otherwise.  No hematuria, frequency, or dysuria. While urinating into specimen cup for urinalysis here, patient notes that he passed a small kidney stone.  He reports no urethal pain and is now assymptomatic.  REVIEW OF SYSTEMS Constitutional Symptoms      Denies fever, chills, night sweats, weight loss, weight gain, and fatigue.  Eyes       Denies change in vision, eye pain, eye discharge, glasses, contact lenses, and eye surgery. Ear/Nose/Throat/Mouth       Denies hearing loss/aids, change  in hearing, ear pain, ear discharge, dizziness, frequent runny nose, frequent nose bleeds, sinus problems, sore throat, hoarseness, and tooth pain or bleeding.  Respiratory       Denies dry cough, productive cough, wheezing, shortness of breath, asthma, bronchitis, and emphysema/COPD.  Cardiovascular       Denies murmurs, chest pain, and tires easily with exhertion.    Gastrointestinal       Denies stomach pain, nausea/vomiting, diarrhea, constipation, blood in bowel movements, and indigestion. Genitourniary       Complains of kidney stones.      Denies painful urination and loss of urinary control. Neurological       Denies paralysis, seizures, and fainting/blackouts. Musculoskeletal       Denies muscle pain, joint pain, joint stiffness, decreased range of motion, redness, swelling, muscle weakness, and gout.  Skin       Denies bruising, unusual mles/lumps or sores, and hair/skin or nail changes.  Psych       Complains of depression and sleep problems.      Denies mood changes, temper/anger issues, anxiety/stress, and speech problems.  Past History:  Past Surgical History: Right knee arthroscopy 11-06 lithotripsy 10-04 Cystoscopy 10-04 Lithotripsy for Kidney stones 9-08 Left elbow surgery 2009 - now has RSD.  ulnar nerve transposition  Family History: Father with Lung Ca - deceased Mother with kidney infection - deceased sister alive and healthy    Objective:  Appearance:  Patient appears healthy, stated age, and in no acute distress  Eyes:  Pupils are equal, round, and reactive to light and accomdation.  Extraocular movement is intact.  Conjunctivae are not inflamed.  Pharynx:  Normal, moist mucous membranes  Neck:  Supple.  No adenopathy is present.  No thyromegaly is present  Lungs:  Clear to auscultation.  Breath sounds are equal.  Heart:  Regular rate and rhythm without murmurs, rubs, or gallops.  Abdomen:  Nontender without masses or hepatosplenomegaly.  Bowel sounds  are present.  No CVA or flank tenderness.  urinalysis (dipstick):  Large blood Assessment New Problems: NEPHROLITHIASIS (ICD-592.0)  Kidney stone passed in office.  Patient notes that he has already had stone analysis in the past.  He notes that he has a urologist follow-up appt in about a week  Plan New Orders: T-Urinalysis Dipstick only [[id number]] New Patient Level III [16109] Planning Comments:   Rest, increase fluid intake over the next 2 days.  Call for development of dysuria, fever, etc.   Follow-up with urologist as scheduled   The patient and/or caregiver has been counseled thoroughly with regard to medications prescribed including dosage, schedule, interactions, rationale for use, and possible side effects and they verbalize understanding.  Diagnoses and expected course of recovery discussed and will return if not improved as expected or if the condition worsens. Patient and/or caregiver verbalized understanding.   Laboratory Results   Urine Tests  Date/Time Received: Lannie Fields  May 22, 2009 10:08 AM  Date/Time Reported: Lannie Fields  May 22, 2009 10:08 AM   Routine Urinalysis   Color: yellow Appearance: Cloudy Glucose: negative   (Normal Range: Negative) Bilirubin: negative   (Normal Range: Negative) Ketone: negative   (Normal Range: Negative) Spec. Gravity: 1.010   (Normal Range: 1.003-1.035) Blood: large   (Normal Range: Negative) pH: 7.0   (Normal Range: 5.0-8.0) Protein: negative   (Normal Range: Negative) Urobilinogen: 1.0   (Normal Range: 0-1) Nitrite: negative   (Normal Range: Negative) Leukocyte Esterace: negative   (Normal Range: Negative)

## 2010-10-25 NOTE — Procedures (Signed)
Summary: Colonoscopy need order for psa   Flex Sig Next Due:  Not Indicated Colonoscopy Result Date:  11/01/2009 Hemoccult Next Due:  Not Indicated  Call pt: Due for PSA test as well. Has been over a year. February  7, 20111:53 PM Dewane Timson MD, Santina Evans   pt informed due for PSA, pt says ok, will get done,  need order  Appended Document: Colonoscopy need order for psa pt informed lab order faxed to [facility]

## 2010-10-25 NOTE — Assessment & Plan Note (Signed)
Summary: INsomina, lump, chest pain.    Vital Signs:  Patient profile:   60 year old male Height:      66.1 inches Weight:      176 pounds BMI:     28.42 Pulse rate:   86 / minute BP sitting:   158 / 91  (left arm) Cuff size:   regular  Vitals Entered By: [name] (April 19, 2009 10:54 AM) CC: alot of anxiety lateley, can't sleep.   Primary Care Provider:  Linford Arnold, C  CC:  alot of anxiety lateley and can't sleep.[name]  History of Present Illness: alot of anxiety lateley, can't sleep. Has been present for one month. Excessive daytime sleepiness.  No snoring. Has been having HA for along time.  Occ wake up with them.  The situation with his left elebow has been going on for 2.5 years and it has been veyr stressful. Pani from his neck to his hands.  Has a lump on his Left shoulder.  Occ takes a klonopin at bedtime band helps some but then wakes up.  Has a hard time falling asleep secondary to thoughts racing.  Still getting occ left sided chest pain and this is bothering him as well. He had similar CP back in 05/2008 adn had a normal EKG.      Current Medications (verified): 1)  Zoloft 100 Mg Tabs (Sertraline Hcl) .... Take 1 Tablet By Mouth Two Times A Day 2)  Klonopin 0.5 Mg Tabs (Clonazepam) .... 2 Tabs Daily 3)  Niaspan 750 Mg  Tbcr (Niacin (Antihyperlipidemic)) .... Take 1 Tablet By Mouth Once A Day 4)  Fish Oil 1000 Mg  Caps (Omega-3 Fatty Acids) .... 2 Tabs With Each Meal 5)  Tramadol Hcl 50 Mg Tabs (Tramadol Hcl) .... For Headaches. 6)  Topamax 200 Mg Tabs (Topiramate) .... Take One Tablet By Mouth in The Morning and Evening  Allergies (verified): No Known Drug Allergies  Comments:  Nurse/Medical Assistant: The patient's medications and allergies were reviewed with the patient and were updated in the Medication and Allergy Lists. [name] (April 19, 2009 10:56 AM)  Past History:  Past Medical History: Last updated: 06/23/2008 Hx recurrent Kidney stones Wears  glasses.  Torn ligament in the left elbow.  Sees Psych - Dr. Christell Constant.   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  4 years college.  Married to [name] with 4 children.   Former Smoker Alcohol use-yes Drug use-no Regular exercise-no  Family History: Reviewed history from 06/17/2007 and no changes required. Father with Lung Ca Mother with kidney infection  Social History: Reviewed history from 06/23/2008 and no changes required. 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  4 years college.  Married to [name] with 4 children.   Former Smoker Alcohol use-yes Drug use-no Regular exercise-no  Physical Exam  General:  Well-developed,well-nourished,in no acute distress; alert,appropriate and cooperative throughout examination Head:  Normocephalic and atraumatic without obvious abnormalities. No apparent alopecia or balding. Eyes:  No corneal or conjunctival inflammation noted. EOMI. Perrla. Ears:  External ear exam shows no significant lesions or deformities.  Otoscopic examination reveals clear canals, tympanic membranes are intact bilaterally without bulging, retraction, inflammation or discharge. Hearing is grossly normal bilaterally. Nose:  External nasal examination shows no deformity or inflammation. Lungs:  Normal respiratory effort, chest expands symmetrically. Lungs are clear to auscultation, no crackles or wheezes. Heart:  Normal rate and  regular rhythm. S1 and S2 normal without gallop, murmur, click, rub or other extra sounds. Msk:  The left upper shuodler over the upper trap betweent the neck and shoulder there is a 1.5 cm raised area. Not consistant with a LN or lipoma.  Nontender neck or deltoid.   Skin:  no rashes.   Psych:  Cognition and judgment appear intact. Alert and cooperative with normal attention span and concentration. No apparent delusions, illusions,  hallucinations   Impression & Recommendations:  Problem # 1:  DISORDER, DEPRESSIVE NEC (ICD-311) Dsicussed that if still having alot of anxiety then really needs to discuss with Dr. Christell Constant. He just saw her 2 weeks ago and she increased his zoloft . Explained that this can take a few weeks to work.  The following medications were removed from the medication list:    Amitriptyline Hcl 25 Mg Tabs (Amitriptyline hcl) .[name]... Take 1 tablet by mouth once a day at bedtime His updated medication list for this problem includes:    Zoloft 100 Mg Tabs (Sertraline hcl) .[name]... Take 1 tablet by mouth two times a day    Klonopin 0.5 Mg Tabs (Clonazepam) .[name]... 2 tabs daily as needed  Problem # 2:  INSOMNIA (ICD-780.52) Stongly suspect that his sleep is being disrupted by anxiety and mood.  Since he recently incrased his zoloft and doens't havd another psych fu till august will treat short term with low dose Ambien and see if helps reset his sleep cycle. I asked if he noticed a difference with his sleep on the amitryptiline and he wasn't sure. It wasn't really helping his elbow so he stopped it. Has an appt with Neuro for this next week for EMG studdies of his Left arm.  His updated medication list for this problem includes:    Zolpidem Tartrate 5 Mg Tabs (Zolpidem tartrate) .[name]... Take 1 tablet by mouth once a day  Problem # 3:  CHEST PAIN, ATYPICAL (ICD-786.59)  He does have HTN, and cholesterol so even thought his chest pain is atypical and may be more stress related I would like to set him up for a cardiac stress test. I had let him know earlier that is pain persisted we would schedule a stress test.   Orders: ETT (ETT)  Problem # 4:  UNSPECIFIED DISORDER OF SHOULDER JOINT (ICD-719.91) Will set up for US of the lesion to see if a mass , LN, or muscle tissue.  Orders: T-*Unlisted Diagnostic X-ray test/procedure (16109)  Complete Medication List: 1)  Zoloft 100 Mg Tabs (Sertraline hcl) .... Take 1 tablet  by mouth two times a day 2)  Klonopin 0.5 Mg Tabs (Clonazepam) .... 2 tabs daily as needed 3)  Niaspan 750 Mg Tbcr (Niacin (antihyperlipidemic)) .... Take 1 tablet by mouth once a day 4)  Fish Oil 1000 Mg Caps (Omega-3 fatty acids) .... 2 tabs with each meal 5)  Tramadol Hcl 50 Mg Tabs (Tramadol hcl) .... For headaches. 6)  Topamax 200 Mg Tabs (Topiramate) .... Take one tablet by mouth in the morning and evening 7)  Zolpidem Tartrate 5 Mg Tabs (Zolpidem tartrate) .... Take 1 tablet by mouth once a day Prescriptions: ZOLPIDEM TARTRATE 5 MG TABS (ZOLPIDEM TARTRATE) Take 1 tablet by mouth once a day  #20 x 0   Entered and Authorized by:   Nani Gasser MD   Signed by:   Nani Gasser MD on 04/19/2009   Method used:   Print then Give to Patient   RxID:   [id number]

## 2010-10-25 NOTE — Assessment & Plan Note (Signed)
Summary: CPE   Vital Signs:  Patient profile:   60 year old male Height:      66.1 inches Weight:      173 pounds Pulse rate:   80 / minute BP sitting:   147 / 88  (right arm) Cuff size:   regular  Vitals Entered By: Avon Gully CMA, Duncan Dull) (August 02, 2010 3:22 PM) CC: CPE   Primary Care Provider:  Linford Arnold, C  CC:  CPE.  History of Present Illness: Stopped his clonidine over  a week ago after weaning per his MD instructions.    Pain over the left shoulder and nkeck that radiates around his left ear.  Intermittant for the last month of two. Has talked to his pain MD about it and he feels like it is msucle strain from compensation.   Has noticed some bright red blood after wiping after a BM. Has noticed it a couple of times in the last few days. Last colonoscopy was in feb of this year.   Also had abdominal Ct about 3 months ago that showed he may have some internal hemorrhoid. No pain with BMs. Denies any constipation.   Current Medications (verified): 1)  Klonopin 0.25 Mg Tabs (Clonazepam) .... At Night 2)  Fish Oil 1000 Mg  Caps (Omega-3 Fatty Acids) .... 2 Tabs By Mouth Two Times A Day 3)  Tramadol Hcl 50 Mg Tabs (Tramadol Hcl) .... For Headaches. 4)  Naproxen 500 Mg Tabs (Naproxen) .... As Directed 5)  Multivitamins  Tabs (Multiple Vitamin) .... Take One Tablet By Mouth Daily 6)  Zoloft 100 Mg Tabs (Sertraline Hcl) .... Take 1 Tablet By Mouth Once A Day At Bedtime 7)  Clonazepam 0.25 Mg Tbdp (Clonazepam) 8)  Clonidine Hcl 0.1 Mg Tabs (Clonidine Hcl) .... Take Two Pills Per Mouth Once A Day  Allergies (verified): No Known Drug Allergies  Comments:  Nurse/Medical Assistant: The patient's medications and allergies were reviewed with the patient and were updated in the Medication and Allergy Lists. Avon Gully CMA, Duncan Dull) (August 02, 2010 3:24 PM)  Past History:  Past Medical History: Last updated: 08/10/2009 Hx recurrent Kidney stones Wears glasses.    Torn ligament in the left elbow.  Sees Psych - Dr. Christell Constant.  lithotripsy 10-04 Cystoscopy 10-04 Lithotripsy for Kidney stones 9-08  Past Surgical History: Last updated: 08/10/2009 Right knee arthroscopy 11-06 Left elbow surgery 2009 - now has RSD.  ulnar nerve transposition  Family History: Last updated: 08/10/2009 Father with Lung Ca - deceased Mother with kidney infection - deceased sister alive and healthy.     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  Married to [name] with 4 children.   Former Smoker Alcohol use-yes Drug use-no Regular exercise-no  Review of Systems  The patient denies anorexia, fever, weight loss, weight gain, vision loss, decreased hearing, hoarseness, chest pain, syncope, dyspnea on exertion, peripheral edema, prolonged cough, headaches, hemoptysis, abdominal pain, melena, hematochezia, severe indigestion/heartburn, hematuria, incontinence, genital sores, muscle weakness, suspicious skin lesions, transient blindness, difficulty walking, depression, unusual weight change, enlarged lymph nodes, angioedema, and testicular masses.    Physical Exam  General:  Well-developed,well-nourished,in no acute distress; alert,appropriate and cooperative throughout examination Head:  Normocephalic and atraumatic without obvious abnormalities. No apparent alopecia or balding. Eyes:  No corneal or conjunctival inflammation noted. EOMI. Perrla.  Ears:  External ear exam shows no significant lesions or deformities.  Otoscopic examination reveals clear canals, tympanic membranes are intact bilaterally without bulging, retraction, inflammation or discharge. Hearing is grossly normal bilaterally. Nose:  External nasal examination shows no deformity or inflammation.  Mouth:  Oral mucosa and oropharynx without lesions or exudates.  Teeth in good repair. Neck:  No deformities,  masses, or tenderness noted. Chest Wall:  No deformities, masses, tenderness or gynecomastia noted. Lungs:  Normal respiratory effort, chest expands symmetrically. Lungs are clear to auscultation, no crackles or wheezes. Heart:  Normal rate and regular rhythm. S1 and S2 normal without gallop, murmur, click, rub or other extra sounds. Abdomen:  Bowel sounds positive,abdomen soft and non-tender without masses, organomegaly or hernias noted. Rectal:  no external abnormalities and normal sphincter tone.  Stool neg for occult blood.   Prostate:  no nodules, no asymmetry, and 1+ enlarged.   Msk:  No deformity or scoliosis noted of thoracic or lumbar spine.   Pulses:  R and L carotid,radial,femoral,dorsalis pedis and posterior tibial pulses are full and equal bilaterally Extremities:  No clubbing, cyanosis, edema, or deformity noted with normal full range of motion of all joints.   Neurologic:  No cranial nerve deficits noted. Station and gait are normal. DTRs are symmetrical throughout, excpet dec right patellar tendon reflex. Sensory, motor and coordinative functions appear intact. Skin:  no rashes.   Cervical Nodes:  No lymphadenopathy noted Psych:  Cognition and judgment appear intact. Alert and cooperative with normal attention span and concentration. No apparent delusions, illusions, hallucinations   Impression & Recommendations:  Problem # 1:  EXAMINATION, ROUTINE MEDICAL (ICD-V70.0) Exam is normal Discussed increasing his fiber in diet to help soften the stool adn dec hemorrhoid flare. He is not having any pain so didn't rx any medicaiton.  Also his colonoscopy is within the last 12 months. Call if persists after softening the stools or if amount of blood increases.  due for screening lab. encourage regular exercise and healthy diet. Orders: T-Comprehensive Metabolic Panel [id number]) T-Lipid Profile [id number])  Problem # 2:  RECTAL BLEEDING (ICD-569.3) Discussed increasing his  fiber in diet to help soften the stool adn dec hemorrhoid flare. He is not having any pain so didn't rx any medicaiton.  Also his colonoscopy is within the last 12 months. Call if persists after softening the stools or if amount of blood increases.   Problem # 3:  HYPERTENSION, BENIGN, MILD (ICD-401.1) Assessment: New clearly he does have a diagnosis of hypertension.  This was not previously known as he had been taking clonidine for his chronic pain and regional pain syndrome.  Since discontinuing the clonidine one month as his blood pressures have been persistently elevated.  Thus recommend starting a BP medication.  He felt he was getting benefit from the clonidine for his pain thus I do recommend he restart the clonidine that typically this would not be my first-line choice for hypertensive therapy. His updated medication list for this problem includes:    Clonidine Hcl 0.1 Mg Tabs (Clonidine hcl) .[name]... Take two pills per mouth once a day  Complete Medication List: 1)  Klonopin 0.25 Mg Tabs (clonazepam)  .... At night 2)  Fish Oil 1000 Mg Caps (Omega-3 fatty acids) .... 2 tabs by mouth two times a day 3)  Tramadol Hcl 50 Mg Tabs (Tramadol hcl) .... For headaches. 4)  Naproxen 500 Mg Tabs (Naproxen) .... As directed 5)  Multivitamins Tabs (Multiple vitamin) .... Take one tablet by mouth daily 6)  Zoloft 100 Mg Tabs (Sertraline hcl) .Marland KitchenMarland KitchenMarland Kitchen  Take 1 tablet by mouth once a day at bedtime 7)  Clonazepam 0.25 Mg Tbdp (Clonazepam) 8)  Clonidine Hcl 0.1 Mg Tabs (Clonidine hcl) .... Take two pills per mouth once a day  Patient Instructions: 1)  You were given a flu shot today 2)  We will call you with your lab results.  3)  Follow up in one month to recheck Blood pressure.  4)  Also recommend looking at the [facility] diet . Google it on .[email or web address].net/ website.    Orders Added: 1)  T-Comprehensive Metabolic Panel [[id number]] 2)  T-Lipid Profile [[id number]] 3)  Est. Patient age 78-64 [66] 4)  Est.  Patient Level II [16109]    Flex Sig Next Due:  Not Indicated Colonoscopy Result Date:  11/01/2009 Colonoscopy Result:  normal Colonoscopy Next Due:  10 yr Hemoccult Next Due:  Not Indicated Last PSA Result:  0.99 (11/02/2009 11:49:00 PM) PSA Next Due:  1 yr

## 2010-10-25 NOTE — Progress Notes (Signed)
Summary: ? hgb  ---- Converted from flag ---- ---- 05/11/2010 1:08 PM, Avon Gully CMA, (AAMA) wrote: Babette Relic from [facility] short stay called and said the lab called and states pt's bld was spun down and ther is no way his hemoglobin can be a 5.0 and it looked like it would be every bit of 14. they want to do a repeat CBC ------------------------------  Ok to repeat. Only transfuse if less than 7. Linford Arnold MD, Santina Evans

## 2010-10-25 NOTE — Progress Notes (Signed)
Summary: Allegra not strong enough  Phone Note Call from Patient Call back at Home Phone [phone]   Caller: Patient Call For: Nani Gasser MD Summary of Call: States Allegra isn't strong enough. Allergies really bad. Can you increase dose or call in something stronger. Initial call taken by: [name],  January 17, 2008 8:57 AM  Follow-up for Phone Call        Allegra doens't come any stronger.  It does come in a D version. OR we can add Singulair to it and see if this helps.  Follow-up by: Nani Gasser MD,  January 17, 2008 9:07 AM    New/Updated Medications: SINGULAIR 10 MG  TABS (MONTELUKAST SODIUM) Take 1 tablet by mouth once a day   Prescriptions: SINGULAIR 10 MG  TABS (MONTELUKAST SODIUM) Take 1 tablet by mouth once a day  #30 x 3   Entered by:   [name]   Authorized by:   Nani Gasser MD   Signed by:   [name] on 01/17/2008   Method used:   Electronically sent to ...       [facility]  [id number]       [address] [place], [place]         Ph: [phone] or [phone]       Fax: [phone]   RxID:   [id number]

## 2010-10-25 NOTE — Assessment & Plan Note (Signed)
Summary: FU abnormalities on  labs and  and CT per Urology   Vital Signs:  Patient Profile:   60 Years Old Male Height:     66.1 inches Weight:      177 pounds BMI:     28.59 Pulse rate:   75 / minute BP sitting:   136 / 68  (left arm) Cuff size:   regular  Vitals Entered By: [name] (June 28, 2007 3:47 PM)                 PCP:  Cipriano Bunker  Chief Complaint:  discuss labs and CT scan that urologist done.Nathan [name]  History of Present Illness: Sharp Pain in left side of chest. Can occur at rest.  Describes pain as sharp.  happened the other day when meeting with the lawyer. LAst 5-10 minutes at a time.  One time took an ASA.  Rates pain 6/10.  No recetn cardiac workout.  Can mow lawn and no CP or SOB.  No sigficant sxs. No heart racing or pounding.  No relationship to food.  No diaphoresis.    Finished ABX 4 days ago for Kidney stones.  Now on Zoloft, on 2 tabs daily per Dr. Christell Constant (psych) for Depression.   Still gets pain under left side of ribs and in left [name]. GEts pain almost daily forthe last month. No change in gas or belching. Has normal BMs, no constipation. Has been eating more fiber.  Had CT of abdomen and pelvis that showed 1) bilat renal cysts, 2) Diverticulosis without evidence of diverticulitis, 3) Fatty liver.    Current Allergies: No known allergies   Past Medical History:    Reviewed history from 06/17/2007 and no changes required:       Hx recurrent Kidney stones       Wears glasses.   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  Married to [name] with 4 children.         Former Smoker       Alcohol use-yes       Drug use-no       Regular exercise-no    Review of Systems       The patient complains of chest pain and abdominal pain.  The patient denies weight loss, syncope, melena, hematochezia, and hematuria.     Physical Exam  General:     Well-developed,well-nourished,in no acute distress; alert,appropriate and cooperative throughout examination Head:     Normocephalic and atraumatic without obvious abnormalities. No apparent alopecia or balding. Eyes:     No corneal or conjunctival inflammation noted. EOMI. Perrla.  Ears:     External ear exam shows no significant lesions or deformities.  Otoscopic examination reveals clear canals, tympanic membranes are intact bilaterally without bulging, retraction, inflammation or discharge. Hearing is grossly normal bilaterally. Nose:  External nasal examination shows no deformity or inflammation.  Lungs:     Normal respiratory effort, chest expands symmetrically. Lungs are clear to auscultation, no crackles or wheezes. Heart:     Normal rate and regular rhythm. S1 and S2 normal without gallop, murmur, click, rub or other extra sounds.  no carotid bruits.  Abdomen:     RLQ tenderness. Normal BS, otherwise Nontender to palpation.  No guarding, no masses. Soft.     Impression & Recommendations:  Problem # 1:  CHEST PAIN, ATYPICAL (ICD-786.59) Pain is atypical and sounds more stress induced. Gave reassurance.  Continue to work with Dr. Christell Constant on depression, anxiety, and stress levels.  EKG is normal with rate of 60bpm.  No acute abnormalities.   Problem # 2:  LUQ PAIN (ICD-789.02) Could be gastritis. Will do trial of PPI for 2 weeks and see if pain improves.   Pt to call office if helping or not helping.   Problem # 3:  ABDOMINAL PAIN, LEFT LOWER QUADRANT (ICD-789.04) Unclear etiology. CT  only shows diverticulosis.  No  other finding.   Explained has not had GI colon Cancer screening so I do recommend this.   Has regular BMs twice a day.   Problem # 4:  FATTY LIVER DISEASE (ICD-571.8) Gave reassurance. Recommend weight loss. LFTs are all completely normal. Recommend monitor LFTs yearly. Not symptomatic and no evidence of cirrhosis on CT.   Problem # 5:  HYPERLIPIDEMIA NEC/NOS (ICD-272.4) Needs to restart Fish Oil and Niacin.  Take ASA at dinner time and then Niaspan at bedtime.  Recheck levels in 3 month.  His updated medication list for this problem includes:    Niaspan 750 Mg Tbcr (Niacin (antihyperlipidemic)) .Nathan [name]... Take 1 tablet by mouth once a day   Complete Medication List: 1)  Zoloft 25 Mg Tabs (Sertraline hcl) .... Take one tablet by mouth once a day 2)  Alprazolam 0.5 Mg Tabs (Alprazolam) .... Take one tablet by mouth as needed for anxiety 3)  Ciprofloxacin Hcl 500 Mg Tabs (Ciprofloxacin hcl) .... Take one tablet by mouth twice a day for 10 days 4)  Niaspan 750 Mg Tbcr (Niacin (antihyperlipidemic)) .... Take 1 tablet by mouth once a day 5)  Fish Oil 1000 Mg Caps (Omega-3 fatty acids) .... 2 tabs with each meal  Other Orders: T-PSA  ([id number])   Patient Instructions: 1)  Take Aspirin at dinner time and then the Niaspan at bedtime.  It will cause some flushing to start. 2)  Start Fish Oil 1000mg  2 tabs with each meal.   3)  TAke Protonix 20 minutes before breakfast. Call our office if it is helping your symptoms.     Prescriptions: NIASPAN 750 MG  TBCR (NIACIN (ANTIHYPERLIPIDEMIC)) Take 1 tablet by mouth once a day  #30 x 4   Entered and Authorized by:   Nani Gasser MD   Signed by:   Nani Gasser MD on 06/28/2007   Method used:   Print then Give to Patient   RxID:   [id number]  ]  Appended Document: FU abnormalities on  labs and  and CT per Urology

## 2010-10-25 NOTE — Letter (Signed)
Summary: [facility]  [facility]   Imported By: Lanelle Bal 09/08/2009 09:55:00  _____________________________________________________________________  External Attachment:    Type:   Image     Comment:   External Document

## 2010-10-25 NOTE — Progress Notes (Signed)
  Phone Note Call from Patient Call back at [facility] Phone [phone]   Caller: Patient Call For: Tori Summary of Call: Tori this man called and wants an appt with Dr. Linford Arnold this week for followup kidney stones. Thanks Kim Initial call taken by: [name],  June 25, 2007 11:18 AM  Follow-up for Phone Call        APP MADE 06-28-07 Follow-up by: Sarina Ill,  June 25, 2007 11:33 AM

## 2010-11-14 ENCOUNTER — Encounter (HOSPITAL_COMMUNITY): Payer: Self-pay | Admitting: Psychiatry

## 2010-11-16 ENCOUNTER — Encounter (HOSPITAL_COMMUNITY): Payer: Self-pay | Admitting: Psychiatry

## 2010-11-28 ENCOUNTER — Encounter (INDEPENDENT_AMBULATORY_CARE_PROVIDER_SITE_OTHER): Payer: 59 | Admitting: Psychiatry

## 2010-11-28 DIAGNOSIS — F339 Major depressive disorder, recurrent, unspecified: Secondary | ICD-10-CM

## 2010-12-01 ENCOUNTER — Ambulatory Visit (INDEPENDENT_AMBULATORY_CARE_PROVIDER_SITE_OTHER): Payer: 59 | Admitting: Family Medicine

## 2010-12-01 ENCOUNTER — Encounter: Payer: Self-pay | Admitting: Family Medicine

## 2010-12-01 DIAGNOSIS — J019 Acute sinusitis, unspecified: Secondary | ICD-10-CM | POA: Insufficient documentation

## 2010-12-06 NOTE — Assessment & Plan Note (Signed)
Summary: Acute sinusitis   Vital Signs:  Patient profile:   60 year old male Height:      66.1 inches Weight:      177 pounds O2 Sat:      95 % on Room air Temp:     98.6 degrees F oral Pulse rate:   75 / minute BP sitting:   136 / 81  (right arm) Cuff size:   regular  Vitals Entered By: Avon Gully CMA, Duncan Dull) (December 01, 2010 9:26 AM)  O2 Flow:  Room air CC: sinus drainage x 10 days   Primary Care Provider:  Linford Arnold, C  CC:  sinus drainage x 10 days.  History of Present Illness: Left facialo pressure and thick nasal drainage from the left. Some blood discharge wtih it and sometimescrusty. Mucose clots out of the left nostril. Had a URI about 3 weeks ago and after that left with these sxs. No current fever. Did have a fever 3 weeks.  No GI sxs. HA on the left side of head. Using nasal saline spray and didn't help.   Current Medications (verified): 1)  Klonopin 0.25 Mg Tabs (Clonazepam) .... At Night 2)  Fish Oil 1000 Mg  Caps (Omega-3 Fatty Acids) .... 2 Tabs By Mouth Two Times A Day 3)  Naproxen 500 Mg Tabs (Naproxen) .... As Directed 4)  Multivitamins  Tabs (Multiple Vitamin) .... Take One Tablet By Mouth Daily 5)  Zoloft 100 Mg Tabs (Sertraline Hcl) .... Take 1 Tablet By Mouth Once A Day At Bedtime 6)  Clonazepam 0.25 Mg Tbdp (Clonazepam) 7)  Clonidine Hcl 0.1 Mg Tabs (Clonidine Hcl) .... Take Two Pills Per Mouth Once A Day 8)  Trazodone Hcl 300 Mg Tabs (Trazodone Hcl)  Allergies (verified): No Known Drug Allergies  Comments:  Nurse/Medical Assistant: The patient's medications and allergies were reviewed with the patient and were updated in the Medication and Allergy Lists. Avon Gully CMA, Duncan Dull) (December 01, 2010 9:27 AM)  Physical Exam  General:  Well-developed,well-nourished,in no acute distress; alert,appropriate and cooperative throughout examination Head:  Normocephalic and atraumatic without obvious abnormalities. No apparent alopecia or balding.  No maxillarhy sinuses.  Eyes:  No corneal or conjunctival inflammation noted. EOMI. Perrla.  Ears:  External ear exam shows no significant lesions or deformities.  Otoscopic examination reveals clear canals, tympanic membranes are intact bilaterally without bulging, retraction, inflammation or discharge. Hearing is grossly normal bilaterally. Nose:  External nasal examination shows no deformity or inflammation. Left nares with no discharge or lesion.  Mouth:  Oral mucosa and oropharynx without lesions or exudates.  Teeth in good repair. Neck:  No deformities, masses, or tenderness noted. Lungs:  Normal respiratory effort, chest expands symmetrically. Lungs are clear to auscultation, no crackles or wheezes. Heart:  Normal rate and regular rhythm. S1 and S2 normal without gallop, murmur, click, rub or other extra sounds. Skin:  no rashes.   Cervical Nodes:  No lymphadenopathy noted Psych:  Cognition and judgment appear intact. Alert and cooperative with normal attention span and concentration. No apparent delusions, illusions, hallucinations   Impression & Recommendations:  Problem # 1:  SINUSITIS - ACUTE-NOS (ICD-461.9)  His updated medication list for this problem includes:    Amoxicillin 875 Mg Tabs (Amoxicillin) .[name]... Take 1 tablet by mouth two times a day for 10 days  Instructed on treatment. Call if symptoms persist or worsen.   Complete Medication List: 1)  Klonopin 0.25 Mg Tabs (clonazepam)  .... At night 2)  Fish Oil 1000 Mg Caps (Omega-3 fatty acids) .... 2 tabs by mouth two times a day 3)  Naproxen 500 Mg Tabs (Naproxen) .... As directed 4)  Multivitamins Tabs (Multiple vitamin) .... Take one tablet by mouth daily 5)  Zoloft 100 Mg Tabs (Sertraline hcl) .... Take 1 tablet by mouth once a day at bedtime 6)  Clonazepam 0.25 Mg Tbdp (Clonazepam) 7)  Clonidine Hcl 0.1 Mg Tabs (Clonidine hcl) .... Take two pills per mouth once a day 8)  Trazodone Hcl 300 Mg Tabs (Trazodone hcl) 9)   Amoxicillin 875 Mg Tabs (Amoxicillin) .... Take 1 tablet by mouth two times a day for 10 days  Patient Instructions: 1)  Can keep using your nasal saline 2)  Run your humidifier at night 3)  Complete the antibiotic.  4)  CAll if not better in one week.  Prescriptions: AMOXICILLIN 875 MG TABS (AMOXICILLIN) Take 1 tablet by mouth two times a day for 10 days  #20 x 0   Entered and Authorized by:   Nani Gasser MD   Signed by:   Nani Gasser MD on 12/01/2010   Method used:   Electronically to        [facility] [phone]* (retail)       [address]       [place], [place]  [address]       Ph: [phone] or [phone]       Fax: [phone]   RxID:   [id number]    Orders Added: 1)  Est. Patient Level III [36644]

## 2010-12-20 ENCOUNTER — Ambulatory Visit (HOSPITAL_COMMUNITY): Payer: 59 | Admitting: Psychology

## 2011-02-07 NOTE — Op Note (Signed)
NAME:  JOSEH, SJOGREN            ACCOUNT NO.:  [id number]   MEDICAL RECORD NO.:  [id number]          PATIENT TYPE:  AMB   LOCATION:  [facility]                          FACILITY:  [facility]   PHYSICIAN:  Harvie Junior, M.D.   DATE OF BIRTH:  [birth year]   DATE OF PROCEDURE:  DATE OF DISCHARGE:                               OPERATIVE REPORT   ORTHOPEDIC SURGERY SERVICE.   DATE OF SURGERY:  October 09, 2007.   PREOPERATIVE DIAGNOSES:  Medial epicondylitis recalcitrant to  conservative care.   POSTOPERATIVE DIAGNOSES:  Medial epicondylitis recalcitrant to  conservative care.   PROCEDURE:  Medial epicondylar relief.   SURGEON:  Harvie Junior, M.D.   ASSISTANT:  Marshia Ly, P.A.   ANESTHESIA:  General.   BRIEF HISTORY:  Mr. Maland is a 60 year old male with a long history of  having had significant medial epicondylar pain.  He had been treated  conservatively for a prolonged period of time.  He had injections which  had helped but his pain recurred.  He was dramatically turned to  palpation right over that medial epicondyle.  We had a long talk about  treatment options and ultimately felt that medial epicondylar release is  appropriate given the prolonged nature of the disease and improvement  with injection therapy.  He was brought to the operating room for that  procedure.  The patient did not have any significant ulnar serve  symptoms preoperatively in terms of radiating pain.  He did have a  little bit of pain with resistance of flexion of the fingers but no  pain.  At this point, he was brought to the operating room for this  procedure.   PROCEDURE IN DETAIL:  The patient was taken to the operating room.  After adequate anesthesia, the patient was placed on the operating  table.  The right arm was prepared and draped in a usual sterile  fashion.  Following this, the arm was exsanguinated.  Blood pressure  tourniquet was inflated to 250 mmHg.  Following this, incision was  made  essentially centered between the olecranon and the medial epicondyle in  case the patient ever needed any kind of ulnar nerve surgery.  Once this  was completed, attention was turned anteriorly in the wound to the  medial epicondyle.  A medial flexor mass was incised and then released  off of the medial epicondyle.  A medial epicondyle was then rarefied  down to significant area of bleeding bone.  Once this completed, the  medial diseased tissue on the undersurface of the medial flexor mass was  debrided and this was then closed back over this area of bleeding bone.  Once this was completed, the elbow was copiously and thoroughly  irrigated and suctioned dry.  The ulnar nerve was  palpated but not exposed or sheath was not open.  At this point, the  skin was closed with 0 and 2-0 Vicryl and 3-0 Monocryl subcuticular  stitch.  Benzoin and Steri-Strips were applied.  Sterile compressive  dressing was applied.  The patient was taken to recovery and found to be  in satisfactory  condition.      Harvie Junior, M.D.  Electronically Signed     JLG/MEDQ  D:  10/09/2007  T:  10/09/2007  Job:  [id number]

## 2011-02-28 ENCOUNTER — Encounter (INDEPENDENT_AMBULATORY_CARE_PROVIDER_SITE_OTHER): Payer: 59 | Admitting: Psychiatry

## 2011-02-28 DIAGNOSIS — F339 Major depressive disorder, recurrent, unspecified: Secondary | ICD-10-CM

## 2011-05-01 ENCOUNTER — Encounter (HOSPITAL_COMMUNITY): Payer: Self-pay | Admitting: Psychology

## 2011-05-01 ENCOUNTER — Encounter (INDEPENDENT_AMBULATORY_CARE_PROVIDER_SITE_OTHER): Payer: 59 | Admitting: Psychiatry

## 2011-05-01 DIAGNOSIS — F339 Major depressive disorder, recurrent, unspecified: Secondary | ICD-10-CM

## 2011-06-15 LAB — POCT HEMOGLOBIN-HEMACUE: Hemoglobin: 16.1

## 2011-07-06 ENCOUNTER — Encounter (HOSPITAL_COMMUNITY): Payer: Self-pay | Admitting: Psychology

## 2011-08-01 ENCOUNTER — Encounter (HOSPITAL_COMMUNITY): Payer: 59 | Admitting: Psychiatry

## 2011-08-02 DIAGNOSIS — M75122 Complete rotator cuff tear or rupture of left shoulder, not specified as traumatic: Secondary | ICD-10-CM | POA: Insufficient documentation

## 2011-09-26 DIAGNOSIS — K5792 Diverticulitis of intestine, part unspecified, without perforation or abscess without bleeding: Secondary | ICD-10-CM

## 2011-09-26 HISTORY — DX: Diverticulitis of intestine, part unspecified, without perforation or abscess without bleeding: K57.92

## 2011-10-09 ENCOUNTER — Encounter (HOSPITAL_COMMUNITY): Payer: Self-pay | Admitting: Psychiatry

## 2011-10-09 ENCOUNTER — Ambulatory Visit (INDEPENDENT_AMBULATORY_CARE_PROVIDER_SITE_OTHER): Payer: 59 | Admitting: Psychiatry

## 2011-10-09 VITALS — BP 138/90 | Ht 66.0 in | Wt 180.0 lb

## 2011-10-09 DIAGNOSIS — F329 Major depressive disorder, single episode, unspecified: Secondary | ICD-10-CM

## 2011-10-09 MED ORDER — CLONAZEPAM 0.5 MG PO TABS: 0.5000 mg | ORAL_TABLET | Freq: Two times a day (BID) | ORAL | Status: DC | PRN

## 2011-10-09 NOTE — Progress Notes (Signed)
   [facility] Follow-up Outpatient Visit  [facility] 16-Dec-1950   Subjective: The patient is a 61 year old male who has been followed by [facility] since September of 2008. He is currently diagnosed with major depressive disorder. He has been doing well on Zoloft, Klonopin, and trazodone. The patient reports he rarely uses the Klonopin. He has had 30 tablet since March. He only takes one half at a time. He had surgery on his rotator cuff 6 weeks ago. He is currently wearing a brace on his left arm. He has to wear to sleep at night so it makes it difficult to sleep. He will use trazodone at bedtime. The patient is worried about the trazodone becoming addictive. He endorses good appetite. Weight is remaining stable. He is seeing a Careers adviser after his appointment with me today. He was initially told that he would have to wear the brace for 12 weeks. He is hoping now that it might only be 6.  Filed Vitals:   10/09/11 1410  BP: 138/90    Mental Status Examination  Appearance: Casually Alert: Yes Attention: good  Cooperative: Yes Eye Contact: Good Speech: Regular rate rhythm and volume Psychomotor Activity: Normal Memory/Concentration: Intact Oriented: person, place, time/date and situation Mood: Euthymic Affect: Full Range Thought Processes and Associations: Logical Fund of Knowledge: Fair Thought Content: No suicidal or homicidal thoughts Insight: Fair Judgement: Fair  Diagnosis: Maj. depressive disorder recurrent, moderate  Treatment Plan: We'll not make any changes today. I have called in refills on his Klonopin. I will see him back in 3 months.  Jamse Mead, MD

## 2011-12-26 ENCOUNTER — Encounter: Payer: Self-pay | Admitting: Family Medicine

## 2011-12-26 ENCOUNTER — Ambulatory Visit (INDEPENDENT_AMBULATORY_CARE_PROVIDER_SITE_OTHER): Payer: 59 | Admitting: Family Medicine

## 2011-12-26 VITALS — BP 154/88 | HR 66 | Ht 66.0 in | Wt 179.0 lb

## 2011-12-26 DIAGNOSIS — R252 Cramp and spasm: Secondary | ICD-10-CM

## 2011-12-26 DIAGNOSIS — F419 Anxiety disorder, unspecified: Secondary | ICD-10-CM

## 2011-12-26 DIAGNOSIS — R7309 Other abnormal glucose: Secondary | ICD-10-CM

## 2011-12-26 DIAGNOSIS — I10 Essential (primary) hypertension: Secondary | ICD-10-CM

## 2011-12-26 MED ORDER — OLMESARTAN MEDOXOMIL-HCTZ 40-12.5 MG PO TABS: 1.0000 | ORAL_TABLET | Freq: Every day | ORAL | Status: DC

## 2011-12-26 NOTE — Progress Notes (Signed)
  Subjective:    Patient ID: Nathan [name], male    DOB: Dec 23, 1950, 61 y.o.   MRN: [id number]  Hypertension This is a new problem. The current episode started in the past 7 days (started yesterday). The problem has been gradually worsening since onset. The problem is uncontrolled. Associated symptoms include anxiety, headaches and malaise/fatigue. Pertinent negatives include no blurred vision, chest pain, neck pain, peripheral edema, shortness of breath or sweats. (Insomnia) Agents associated with hypertension include NSAIDs. Risk factors for coronary artery disease include male gender, dyslipidemia, sedentary lifestyle, stress and obesity. The current treatment provides no improvement. There is no history of angina, kidney disease, CAD/MI, CVA, heart failure, left ventricular hypertrophy, renovascular disease, retinopathy or a thyroid problem. There is no history of chronic renal disease, hyperparathyroidism, a hypertension causing med or sleep apnea.    Patient had his Catapres doses reduced as he was being switched from Catapres to Keppra. Explained to him that the Catapres may have masked the hypertension since she's been on it for over 2 years. I also has some concern that there was rebound hypertension from coming off the Catapres but the dosage has been reduced and titrated by his pain doctor which should have been minimized any rebound. He did report stopping his Klonopin and I did explain to him that might cause some increase of his blood pressure due to stress. Apparently he was seen at rehabilitation. His blood pressure was elevated at that time and he was sent to the urgent care which sent him over here. He denies any chest pain at this time. He does report some pulling in his left hand that is different from the nerve damage he normally experiences on the left hand and arm.  Review of Systems  Constitutional: Positive for malaise/fatigue.  HENT: Negative for neck pain.   Eyes: Negative  for blurred vision.  Respiratory: Negative for shortness of breath.   Cardiovascular: Negative for chest pain.  Neurological: Positive for headaches.    BP 154/88  Pulse 66  Ht 5\' 6"  (1.676 m)  Wt 179 lb (81.194 kg)  BMI 28.89 kg/m2  SpO2 97% Objective:   Physical Exam  Constitutional: He is oriented to person, place, and time. He appears well-developed and well-nourished.  HENT:  Head: Normocephalic.  Neck: Normal range of motion.  Cardiovascular: Normal rate and normal heart sounds.   Pulmonary/Chest: Effort normal and breath sounds normal.  Neurological: He is alert and oriented to person, place, and time.  Skin: Skin is warm and dry.  Psychiatric: He has a normal mood and affect. His behavior is normal.          Assessment & Plan:  Hypertension we'll start him on Benicar/HCTZ 20/12.5 one tablet a day for 2 weeks and then increase to 40/12.5. Return For followup in a month. Because of the vague symptoms that he has as well as fatigue cramping in his left hand will get magnesium, TSH, CBC and CMP.   Addendum after patient left prescription plan that he is on is requested we used either Diovan, lorsatan, or Micardis instead of Benicar. Also even though patient did not complain of and denied chest pain when he returns for lab work which I did EKG at that time.

## 2011-12-26 NOTE — Patient Instructions (Signed)
Fatigue Fatigue is a feeling of tiredness, lack of energy, lack of motivation, or feeling tired all the time. Having enough rest, good nutrition, and reducing stress will normally reduce fatigue. Consult your caregiver if it persists. The nature of your fatigue will help your caregiver to find out its cause. The treatment is based on the cause.  CAUSES  There are many causes for fatigue. Most of the time, fatigue can be traced to one or more of your habits or routines. Most causes fit into one or more of three general areas. They are: Lifestyle problems  Sleep disturbances.   Overwork.   Physical exertion.   Unhealthy habits.   Poor eating habits or eating disorders.   Alcohol and/or drug use .   Lack of proper nutrition (malnutrition).  Psychological problems  Stress and/or anxiety problems.   Depression.   Grief.   Boredom.  Medical Problems or Conditions  Anemia.   Pregnancy.   Thyroid gland problems.   Recovery from major surgery.   Continuous pain.   Emphysema or asthma that is not well controlled   Allergic conditions.   Diabetes.   Infections (such as mononucleosis).   Obesity.   Sleep disorders, such as sleep apnea.   Heart failure or other heart-related problems.   Cancer.   Kidney disease.   Liver disease.   Effects of certain medicines such as antihistamines, cough and cold remedies, prescription pain medicines, heart and blood pressure medicines, drugs used for treatment of cancer, and some antidepressants.  SYMPTOMS  The symptoms of fatigue include:   Lack of energy.   Lack of drive (motivation).   Drowsiness.   Feeling of indifference to the surroundings.  DIAGNOSIS  The details of how you feel help guide your caregiver in finding out what is causing the fatigue. You will be asked about your present and past health condition. It is important to review all medicines that you take, including prescription and non-prescription items. A  thorough exam will be done. You will be questioned about your feelings, habits, and normal lifestyle. Your caregiver may suggest blood tests, urine tests, or other tests to look for common medical causes of fatigue.  TREATMENT  Fatigue is treated by correcting the underlying cause. For example, if you have continuous pain or depression, treating these causes will improve how you feel. Similarly, adjusting the dose of certain medicines will help in reducing fatigue.  HOME CARE INSTRUCTIONS   Try to get the required amount of good sleep every night.   Eat a healthy and nutritious diet, and drink enough water throughout the day.   Practice ways of relaxing (including yoga or meditation).   Exercise regularly.   Make plans to change situations that cause stress. Act on those plans so that stresses decrease over time. Keep your work and personal routine reasonable.   Avoid street drugs and minimize use of alcohol.   Start taking a daily multivitamin after consulting your caregiver.  SEEK MEDICAL CARE IF:   You have persistent tiredness, which cannot be accounted for.   You have fever.   You have unintentional weight loss.   You have headaches.   You have disturbed sleep throughout the night.   You are feeling sad.   You have constipation.   You have dry skin.   You have gained weight.   You are taking any new or different medicines that you suspect are causing fatigue.   You are unable to sleep at night.     You develop any unusual swelling of your legs or other parts of your body.  SEEK IMMEDIATE MEDICAL CARE IF:   You are feeling confused.   Your vision is blurred.   You feel faint or pass out.   You develop severe headache.   You develop severe abdominal, pelvic, or back pain.   You develop chest pain, shortness of breath, or an irregular or fast heartbeat.   You are unable to pass a normal amount of urine.   You develop abnormal bleeding such as bleeding from  the rectum or you vomit blood.   You have thoughts about harming yourself or committing suicide.   You are worried that you might harm someone else.  MAKE SURE YOU:   Understand these instructions.   Will watch your condition.   Will get help right away if you are not doing well or get worse.  Document Released: 07/09/2007 Document Revised: 08/31/2011 Document Reviewed: 07/09/2007 [facility] Patient Information 2012 [place], [place].Hypertension As your heart beats, it forces blood through your arteries. This force is your blood pressure. If the pressure is too high, it is called hypertension (HTN) or high blood pressure. HTN is dangerous because you may have it and not know it. High blood pressure may mean that your heart has to work harder to pump blood. Your arteries may be narrow or stiff. The extra work puts you at risk for heart disease, stroke, and other problems.  Blood pressure consists of two numbers, a higher number over a lower, 110/72, for example. It is stated as "110 over 72." The ideal is below 120 for the top number (systolic) and under 80 for the bottom (diastolic). Write down your blood pressure today. You should pay close attention to your blood pressure if you have certain conditions such as:  Heart failure.   Prior heart attack.   Diabetes   Chronic kidney disease.   Prior stroke.   Multiple risk factors for heart disease.  To see if you have HTN, your blood pressure should be measured while you are seated with your arm held at the level of the heart. It should be measured at least twice. A one-time elevated blood pressure reading (especially in the Emergency Department) does not mean that you need treatment. There may be conditions in which the blood pressure is different between your right and left arms. It is important to see your caregiver soon for a recheck. Most people have essential hypertension which means that there is not a specific cause. This type of high  blood pressure may be lowered by changing lifestyle factors such as:  Stress.   Smoking.   Lack of exercise.   Excessive weight.   Drug/tobacco/alcohol use.   Eating less salt.  Most people do not have symptoms from high blood pressure until it has caused damage to the body. Effective treatment can often prevent, delay or reduce that damage. TREATMENT  When a cause has been identified, treatment for high blood pressure is directed at the cause. There are a large number of medications to treat HTN. These fall into several categories, and your caregiver will help you select the medicines that are best for you. Medications may have side effects. You should review side effects with your caregiver. If your blood pressure stays high after you have made lifestyle changes or started on medicines,   Your medication(s) may need to be changed.   Other problems may need to be addressed.   Be certain you understand  your prescriptions, and know how and when to take your medicine.   Be sure to follow up with your caregiver within the time frame advised (usually within two weeks) to have your blood pressure rechecked and to review your medications.   If you are taking more than one medicine to lower your blood pressure, make sure you know how and at what times they should be taken. Taking two medicines at the same time can result in blood pressure that is too low.  SEEK IMMEDIATE MEDICAL CARE IF:  You develop a severe headache, blurred or changing vision, or confusion.   You have unusual weakness or numbness, or a faint feeling.   You have severe chest or abdominal pain, vomiting, or breathing problems.  MAKE SURE YOU:   Understand these instructions.   Will watch your condition.   Will get help right away if you are not doing well or get worse.  Document Released: 09/11/2005 Document Revised: 08/31/2011 Document Reviewed: 05/01/2008 [facility] Patient Information 2012 [address], [place].

## 2011-12-27 LAB — CBC WITH DIFFERENTIAL/PLATELET
Basophils Absolute: 0 10*3/uL (ref 0.0–0.1)
Basophils Relative: 0 % (ref 0–1)
Eosinophils Absolute: 0.4 10*3/uL (ref 0.0–0.7)
Eosinophils Relative: 4 % (ref 0–5)
HCT: 49.6 % (ref 39.0–52.0)
Hemoglobin: 16.1 g/dL (ref 13.0–17.0)
Lymphocytes Relative: 28 % (ref 12–46)
Lymphs Abs: 2.7 10*3/uL (ref 0.7–4.0)
MCH: 28.8 pg (ref 26.0–34.0)
MCHC: 32.5 g/dL (ref 30.0–36.0)
MCV: 88.7 fL (ref 78.0–100.0)
Monocytes Absolute: 0.8 10*3/uL (ref 0.1–1.0)
Monocytes Relative: 8 % (ref 3–12)
Neutro Abs: 5.5 10*3/uL (ref 1.7–7.7)
Neutrophils Relative %: 59 % (ref 43–77)
Platelets: 209 10*3/uL (ref 150–400)
RBC: 5.59 MIL/uL (ref 4.22–5.81)
RDW: 15.9 % — ABNORMAL HIGH (ref 11.5–15.5)
WBC: 9.4 10*3/uL (ref 4.0–10.5)

## 2011-12-27 LAB — LIPID PANEL
Cholesterol: 239 mg/dL — ABNORMAL HIGH (ref 0–200)
HDL: 37 mg/dL — ABNORMAL LOW (ref >39)
Total CHOL/HDL Ratio: 6.5 Ratio
Triglycerides: 493 mg/dL — ABNORMAL HIGH (ref <150)

## 2011-12-27 LAB — COMPLETE METABOLIC PANEL WITH GFR
ALT: 19 U/L (ref 0–53)
AST: 17 U/L (ref 0–37)
Albumin: 4.1 g/dL (ref 3.5–5.2)
Alkaline Phosphatase: 104 U/L (ref 39–117)
BUN: 24 mg/dL — ABNORMAL HIGH (ref 6–23)
CO2: 28 mEq/L (ref 19–32)
Calcium: 9.2 mg/dL (ref 8.4–10.5)
Chloride: 102 mEq/L (ref 96–112)
Creat: 1 mg/dL (ref 0.50–1.35)
GFR, Est African American: >89 mL/min
GFR, Est Non African American: 81 mL/min
Glucose, Bld: 116 mg/dL — ABNORMAL HIGH (ref 70–99)
Potassium: 4.2 mEq/L (ref 3.5–5.3)
Sodium: 138 mEq/L (ref 135–145)
Total Bilirubin: 0.9 mg/dL (ref 0.3–1.2)
Total Protein: 7.2 g/dL (ref 6.0–8.3)

## 2011-12-27 LAB — TSH: TSH: 2.253 u[IU]/mL (ref 0.350–4.500)

## 2011-12-27 LAB — MAGNESIUM: Magnesium: 2 mg/dL (ref 1.5–2.5)

## 2011-12-29 NOTE — Progress Notes (Signed)
Addended by: Ellsworth Lennox on: 12/29/2011 10:00 AM   Modules accepted: Orders

## 2012-01-02 ENCOUNTER — Telehealth: Payer: Self-pay | Admitting: *Deleted

## 2012-01-02 DIAGNOSIS — E785 Hyperlipidemia, unspecified: Secondary | ICD-10-CM

## 2012-01-02 MED ORDER — FENOFIBRATE 145 MG PO TABS: 145.0000 mg | ORAL_TABLET | Freq: Every day | ORAL | Status: DC

## 2012-01-02 NOTE — Telephone Encounter (Signed)
Please notify him that the prescription has been sent to his pharmacy.

## 2012-01-02 NOTE — Telephone Encounter (Signed)
We did not have samples of tricor to leave for Pt so new Rx will need to be sent to his pharm.

## 2012-01-03 LAB — HEMOGLOBIN A1C
Hgb A1c MFr Bld: 6.3 % — ABNORMAL HIGH (ref <5.7)
Mean Plasma Glucose: 134 mg/dL — ABNORMAL HIGH (ref <117)

## 2012-01-16 ENCOUNTER — Encounter (HOSPITAL_COMMUNITY): Payer: Self-pay | Admitting: Psychiatry

## 2012-01-16 ENCOUNTER — Ambulatory Visit (INDEPENDENT_AMBULATORY_CARE_PROVIDER_SITE_OTHER): Payer: 59 | Admitting: Psychiatry

## 2012-01-16 VITALS — BP 112/62 | Ht 66.0 in | Wt 182.0 lb

## 2012-01-16 DIAGNOSIS — F329 Major depressive disorder, single episode, unspecified: Secondary | ICD-10-CM

## 2012-01-16 NOTE — Progress Notes (Signed)
   [facility] Follow-up Outpatient Visit  [facility] 05-Jun-1951   Subjective: The patient is a 61 year old male who has been followed by [facility] since September of 2008. He is currently diagnosed with major depressive disorder. He has been doing well on Zoloft, Klonopin, and trazodone. The patient reports he rarely uses the Klonopin. He has had 30 tablet since March. He only takes one half at a time. Today is the first time I have not seen him in a sling. He is undergoing PT for his shoulder. He feels that his shoulder was getting better but they're still complications from from the ulnar neuropathy. The patient had some medication changes since last appointment. He was taken off his clonidine since a new cream that he was given for his shoulder had compounded Catapres in it. He had an episode of hypotension where he was seen by urgent care and then by his primary care physician. He has been started on blood pressure medication. He has also been started on medication for increased triglycerides. They are monitoring his elevated blood sugar. He is up 2 pounds today. We discussed transferring care to a new physician in a practice. Patient reports he is comfortable with me in a rather not change at this time. He does realize that a lot of his depression is related to situations with his shoulder.  Filed Vitals:   01/16/12 1139  BP: 112/62    Mental Status Examination  Appearance: Casually Alert: Yes Attention: good  Cooperative: Yes Eye Contact: Good Speech: Regular rate rhythm and volume Psychomotor Activity: Normal Memory/Concentration: Intact Oriented: person, place, time/date and situation Mood: Euthymic Affect: Full Range Thought Processes and Associations: Logical Fund of Knowledge: Fair Thought Content: No suicidal or homicidal thoughts Insight: Fair Judgement: Fair  Diagnosis: Maj. depressive disorder recurrent, moderate  Treatment Plan:  We'll not make any changes today.I will see him back in 3 months.  Jamse Mead, MD

## 2012-01-25 ENCOUNTER — Ambulatory Visit (INDEPENDENT_AMBULATORY_CARE_PROVIDER_SITE_OTHER): Payer: 59 | Admitting: Family Medicine

## 2012-01-25 ENCOUNTER — Encounter: Payer: Self-pay | Admitting: Family Medicine

## 2012-01-25 VITALS — BP 137/81 | HR 65 | Ht 66.0 in | Wt 181.0 lb

## 2012-01-25 DIAGNOSIS — R0789 Other chest pain: Secondary | ICD-10-CM

## 2012-01-25 DIAGNOSIS — E785 Hyperlipidemia, unspecified: Secondary | ICD-10-CM

## 2012-01-25 DIAGNOSIS — I10 Essential (primary) hypertension: Secondary | ICD-10-CM

## 2012-01-25 MED ORDER — COLESEVELAM HCL 625 MG PO TABS: 1875.0000 mg | ORAL_TABLET | Freq: Two times a day (BID) | ORAL | Status: DC

## 2012-01-25 NOTE — Patient Instructions (Signed)
Chest Pain (Nonspecific) It is often hard to give a specific diagnosis for the cause of chest pain. There is always a chance that your pain could be related to something serious, such as a heart attack or a blood clot in the lungs. You need to follow up with your caregiver for further evaluation. CAUSES  Heartburn.  Pneumonia or bronchitis.  Anxiety or stress.  Inflammation around your heart (pericarditis) or lung (pleuritis or pleurisy).  A blood clot in the lung.  A collapsed lung (pneumothorax). It can develop suddenly on its own (spontaneous pneumothorax) or from injury (trauma) to the chest.  Shingles infection (herpes zoster virus).  The chest wall is composed of bones, muscles, and cartilage. Any of these can be the source of the pain. The bones can be bruised by injury.  The muscles or cartilage can be strained by coughing or overwork.  The cartilage can be affected by inflammation and become sore (costochondritis).  DIAGNOSIS  Lab tests or other studies, such as X-rays, electrocardiography, stress testing, or cardiac imaging, may be needed to find the cause of your pain.  TREATMENT  Treatment depends on what may be causing your chest pain. Treatment may include:  Acid blockers for heartburn.  Anti-inflammatory medicine.  Pain medicine for inflammatory conditions.  Antibiotics if an infection is present.  You may be advised to change lifestyle habits. This includes stopping smoking and avoiding alcohol, caffeine, and chocolate.  You may be advised to keep your head raised (elevated) when sleeping. This reduces the chance of acid going backward from your stomach into your esophagus.  Most of the time, nonspecific chest pain will improve within 2 to 3 days with rest and mild pain medicine.  HOME CARE INSTRUCTIONS  If antibiotics were prescribed, take your antibiotics as directed. Finish them even if you start to feel better.  For the next few days, avoid physical activities that  bring on chest pain. Continue physical activities as directed.  Do not smoke.  Avoid drinking alcohol.  Only take over-the-counter or prescription medicine for pain, discomfort, or fever as directed by your caregiver.  Follow your caregiver's suggestions for further testing if your chest pain does not go away.  Keep any follow-up appointments you made. If you do not go to an appointment, you could develop lasting (chronic) problems with pain. If there is any problem keeping an appointment, you must call to reschedule.  SEEK MEDICAL CARE IF:  You think you are having problems from the medicine you are taking. Read your medicine instructions carefully.  Your chest pain does not go away, even after treatment.  You develop a rash with blisters on your chest.  SEEK IMMEDIATE MEDICAL CARE IF:  You have increased chest pain or pain that spreads to your arm, neck, jaw, back, or abdomen.  You develop shortness of breath, an increasing cough, or you are coughing up blood.  You have severe back or abdominal pain, feel nauseous, or vomit.  You develop severe weakness, fainting, or chills.  You have a fever.  THIS IS AN EMERGENCY. Do not wait to see if the pain will go away. Get medical help at once. Call your local emergency services (911 in [place] Do not drive yourself to the hospital. MAKE SURE YOU:  Understand these instructions.  Will watch your condition.  Will get help right away if you are not doing well or get worse.  Document Released: 06/21/2005 Document Revised: 08/31/2011 Document Reviewed: 04/16/2008 [facility] Patient Information 2012 [employer].Hypertriglyceridemia  Diet for High blood levels of Triglycerides Most fats in food are triglycerides. Triglycerides in your blood are stored as fat in your body. High levels of triglycerides in your blood may put you at a greater risk for heart disease and stroke.  Normal triglyceride levels are less than 150 mg/dL. Borderline high levels are  150-199 mg/dl. High levels are 200 - 499 mg/dL, and very high triglyceride levels are greater than 500 mg/dL. The decision to treat high triglycerides is generally based on the level. For people with borderline or high triglyceride levels, treatment includes weight loss and exercise. Drugs are recommended for people with very high triglyceride levels. Many people who need treatment for high triglyceride levels have metabolic syndrome. This syndrome is a collection of disorders that often include: insulin resistance, high blood pressure, blood clotting problems, high cholesterol and triglycerides. TESTING PROCEDURE FOR TRIGLYCERIDES  You should not eat 4 hours before getting your triglycerides measured. The normal range of triglycerides is between 10 and 250 milligrams per deciliter (mg/dl). Some people may have extreme levels (1000 or above), but your triglyceride level may be too high if it is above 150 mg/dl, depending on what other risk factors you have for heart disease.   People with high blood triglycerides may also have high blood cholesterol levels. If you have high blood cholesterol as well as high blood triglycerides, your risk for heart disease is probably greater than if you only had high triglycerides. High blood cholesterol is one of the main risk factors for heart disease.  CHANGING YOUR DIET  Your weight can affect your blood triglyceride level. If you are more than 20% above your ideal body weight, you may be able to lower your blood triglycerides by losing weight. Eating less and exercising regularly is the best way to combat this. Fat provides more calories than any other food. The best way to lose weight is to eat less fat. Only 30% of your total calories should come from fat. Less than 7% of your diet should come from saturated fat. A diet low in fat and saturated fat is the same as a diet to decrease blood cholesterol. By eating a diet lower in fat, you may lose weight, lower your blood  cholesterol, and lower your blood triglyceride level.  Eating a diet low in fat, especially saturated fat, may also help you lower your blood triglyceride level. Ask your dietitian to help you figure how much fat you can eat based on the number of calories your caregiver has prescribed for you.  Exercise, in addition to helping with weight loss may also help lower triglyceride levels.   Alcohol can increase blood triglycerides. You may need to stop drinking alcoholic beverages.   Too much carbohydrate in your diet may also increase your blood triglycerides. Some complex carbohydrates are necessary in your diet. These may include bread, rice, potatoes, other starchy vegetables and cereals.   Reduce "simple" carbohydrates. These may include pure sugars, candy, honey, and jelly without losing other nutrients. If you have the kind of high blood triglycerides that is affected by the amount of carbohydrates in your diet, you will need to eat less sugar and less high-sugar foods. Your caregiver can help you with this.   Adding 2-4 grams of fish oil (EPA+ DHA) may also help lower triglycerides. Speak with your caregiver before adding any supplements to your regimen.  Following the Diet  Maintain your ideal weight. Your caregivers can help you with a diet.  Generally, eating less food and getting more exercise will help you lose weight. Joining a weight control group may also help. Ask your caregivers for a good weight control group in your area.  Eat low-fat foods instead of high-fat foods. This can help you lose weight too.  These foods are lower in fat. Eat MORE of these:   Dried beans, peas, and lentils.   Egg whites.   Low-fat cottage cheese.   Fish.   Lean cuts of meat, such as round, sirloin, rump, and flank (cut extra fat off meat you fix).   Whole grain breads, cereals and pasta.   Skim and nonfat dry milk.   Low-fat yogurt.   Poultry without the skin.   Cheese made with skim or  part-skim milk, such as mozzarella, parmesan, farmers', ricotta, or pot cheese.  These are higher fat foods. Eat LESS of these:   Whole milk and foods made from whole milk, such as American, blue, cheddar, monterey jack, and swiss cheese   High-fat meats, such as luncheon meats, sausages, knockwurst, bratwurst, hot dogs, ribs, corned beef, ground pork, and regular ground beef.   Fried foods.  Limit saturated fats in your diet. Substituting unsaturated fat for saturated fat may decrease your blood triglyceride level. You will need to read package labels to know which products contain saturated fats.  These foods are high in saturated fat. Eat LESS of these:   Fried pork skins.   Whole milk.   Skin and fat from poultry.   Palm oil.   Butter.   Shortening.   Cream cheese.   Tomasa Blase.   Margarines and baked goods made from listed oils.   Vegetable shortenings.   Chitterlings.   Fat from meats.   Coconut oil.   Palm kernel oil.   Lard.   Cream.   Sour cream.   Fatback.   Coffee whiteners and non-dairy creamers made with these oils.   Cheese made from whole milk.  Use unsaturated fats (both polyunsaturated and monounsaturated) moderately. Remember, even though unsaturated fats are better than saturated fats; you still want a diet low in total fat.  These foods are high in unsaturated fat:   Canola oil.   Sunflower oil.   Mayonnaise.   Almonds.   Peanuts.   Pine nuts.   Margarines made with these oils.   Safflower oil.   Olive oil.   Avocados.   Cashews.   Peanut butter.   Sunflower seeds.   Soybean oil.   Peanut oil.   Olives.   Pecans.   Walnuts.   Pumpkin seeds.  Avoid sugar and other high-sugar foods. This will decrease carbohydrates without decreasing other nutrients. Sugar in your food goes rapidly to your blood. When there is excess sugar in your blood, your liver may use it to make more triglycerides. Sugar also contains calories  without other important nutrients.  Eat LESS of these:   Sugar, brown sugar, powdered sugar, jam, jelly, preserves, honey, syrup, molasses, pies, candy, cakes, cookies, frosting, pastries, colas, soft drinks, punches, fruit drinks, and regular gelatin.   Avoid alcohol. Alcohol, even more than sugar, may increase blood triglycerides. In addition, alcohol is high in calories and low in nutrients. Ask for sparkling water, or a diet soft drink instead of an alcoholic beverage.  Suggestions for planning and preparing meals   Bake, broil, grill or roast meats instead of frying.   Remove fat from meats and skin from poultry before cooking.   Add spices,  herbs, lemon juice or vinegar to vegetables instead of salt, rich sauces or gravies.   Use a non-stick skillet without fat or use no-stick sprays.   Cool and refrigerate stews and broth. Then remove the hardened fat floating on the surface before serving.   Refrigerate meat drippings and skim off fat to make low-fat gravies.   Serve more fish.   Use less butter, margarine and other high-fat spreads on bread or vegetables.   Use skim or reconstituted non-fat dry milk for cooking.   Cook with low-fat cheeses.   Substitute low-fat yogurt or cottage cheese for all or part of the sour cream in recipes for sauces, dips or congealed salads.   Use half yogurt/half mayonnaise in salad recipes.   Substitute evaporated skim milk for cream. Evaporated skim milk or reconstituted non-fat dry milk can be whipped and substituted for whipped cream in certain recipes.   Choose fresh fruits for dessert instead of high-fat foods such as pies or cakes. Fruits are naturally low in fat.  When Dining Out   Order low-fat appetizers such as fruit or vegetable juice, pasta with vegetables or tomato sauce.   Select clear, rather than cream soups.   Ask that dressings and gravies be served on the side. Then use less of them.   Order foods that are baked,  broiled, poached, steamed, stir-fried, or roasted.   Ask for margarine instead of butter, and use only a small amount.   Drink sparkling water, unsweetened tea or coffee, or diet soft drinks instead of alcohol or other sweet beverages.  QUESTIONS AND ANSWERS ABOUT OTHER FATS IN THE BLOOD: SATURATED FAT, TRANS FAT, AND CHOLESTEROL What is trans fat? Trans fat is a type of fat that is formed when vegetable oil is hardened through a process called hydrogenation. This process helps makes foods more solid, gives them shape, and prolongs their shelf life. Trans fats are also called hydrogenated or partially hydrogenated oils.  What do saturated fat, trans fat, and cholesterol in foods have to do with heart disease? Saturated fat, trans fat, and cholesterol in the diet all raise the level of LDL "bad" cholesterol in the blood. The higher the LDL cholesterol, the greater the risk for coronary heart disease (CHD). Saturated fat and trans fat raise LDL similarly.  What foods contain saturated fat, trans fat, and cholesterol? High amounts of saturated fat are found in animal products, such as fatty cuts of meat, chicken skin, and full-fat dairy products like butter, whole milk, cream, and cheese, and in tropical vegetable oils such as palm, palm kernel, and coconut oil. Trans fat is found in some of the same foods as saturated fat, such as vegetable shortening, some margarines (especially hard or stick margarine), crackers, cookies, baked goods, fried foods, salad dressings, and other processed foods made with partially hydrogenated vegetable oils. Small amounts of trans fat also occur naturally in some animal products, such as milk products, beef, and lamb. Foods high in cholesterol include liver, other organ meats, egg yolks, shrimp, and full-fat dairy products. How can I use the new food label to make heart-healthy food choices? Check the Nutrition Facts panel of the food label. Choose foods lower in saturated  fat, trans fat, and cholesterol. For saturated fat and cholesterol, you can also use the Percent Daily Value (%DV): 5% DV or less is low, and 20% DV or more is high. (There is no %DV for trans fat.) Use the Nutrition Facts panel to choose foods low in  saturated fat and cholesterol, and if the trans fat is not listed, read the ingredients and limit products that list shortening or hydrogenated or partially hydrogenated vegetable oil, which tend to be high in trans fat. POINTS TO REMEMBER: YOU NEED A LITTLE TLC (THERAPEUTIC LIFESTYLE CHANGES)  Discuss your risk for heart disease with your caregivers, and take steps to reduce risk factors.   Change your diet. Choose foods that are low in saturated fat, trans fat, and cholesterol.   Add exercise to your daily routine if it is not already being done. Participate in physical activity of moderate intensity, like brisk walking, for at least 30 minutes on most, and preferably all days of the week. No time? Break the 30 minutes into three, 10-minute segments during the day.   Stop smoking. If you do smoke, contact your caregiver to discuss ways in which they can help you quit.   Do not use street drugs.   Maintain a normal weight.   Maintain a healthy blood pressure.   Keep up with your blood work for checking the fats in your blood as directed by your caregiver.  Document Released: 06/29/2004 Document Revised: 08/31/2011 Document Reviewed: 01/25/2009 [employer] Patient Information 2012 [employer]. 

## 2012-01-25 NOTE — Progress Notes (Signed)
Subjective:    Patient ID: Nathan [name], male    DOB: 1951/09/25, 61 y.o.   MRN: [id number]  HPI #1 hypotension or. He states he was in a specialist office and his systolic blood pressure was down in the low 120s over 80. No complaints with the Azor. #2 hyperlipidemia he does reports as being on the TriCor increased abdominal fullness abdominal pressure and distention over his left abdomen. He requested possible to have his cholesterol medication changed. #3 atypical chest pain. Reports some ice having chest pain his left lower chest the size of the pain is about the size of a fist. This pain comes and goes it has woken him up early in the morning he's not having any pain now and he states that his woken up in the morning and not related to exercise or activity. He states it does not radiate it feels as if this on the surface of his chest. He states he has had a stress test before there was a least 5 years ago.   Review of Systems  Cardiovascular: Positive for chest pain.  Gastrointestinal: Positive for abdominal pain and abdominal distention.      BP 137/81  Pulse 65  Ht 5\' 6"  (1.676 m)  Wt 181 lb (82.101 kg)  BMI 29.21 kg/m2  SpO2 96% Objective:   Physical Exam  Constitutional: He appears well-developed and well-nourished.  HENT:  Head: Normocephalic.  Neck: Normal range of motion. Neck supple.  Cardiovascular: Normal rate, regular rhythm and normal heart sounds.   Pulmonary/Chest: Effort normal and breath sounds normal.  Neurological: He is alert.  Skin: Skin is warm and dry.  Psychiatric: He has a normal mood and affect.         Results for orders placed in visit on 12/26/11  COMPLETE METABOLIC PANEL WITH GFR      Component Value Range   Sodium 138  135 - 145 (mEq/L)   Potassium 4.2  3.5 - 5.3 (mEq/L)   Chloride 102  96 - 112 (mEq/L)   CO2 28  19 - 32 (mEq/L)   Glucose, Bld 116 (*) 70 - 99 (mg/dL)   BUN 24 (*) 6 - 23 (mg/dL)   Creat 0.86  5.78 - 4.69 (mg/dL)   Total Bilirubin 0.9  0.3 - 1.2 (mg/dL)   Alkaline Phosphatase 104  39 - 117 (U/L)   AST 17  0 - 37 (U/L)   ALT 19  0 - 53 (U/L)   Total Protein 7.2  6.0 - 8.3 (g/dL)   Albumin 4.1  3.5 - 5.2 (g/dL)   Calcium 9.2  8.4 - 62.9 (mg/dL)   GFR, Est African American >89     GFR, Est Non African American 81    CBC WITH DIFFERENTIAL      Component Value Range   WBC 9.4  4.0 - 10.5 (K/uL)   RBC 5.59  4.22 - 5.81 (MIL/uL)   Hemoglobin 16.1  13.0 - 17.0 (g/dL)   HCT 52.8  41.3 - 24.4 (%)   MCV 88.7  78.0 - 100.0 (fL)   MCH 28.8  26.0 - 34.0 (pg)   MCHC 32.5  30.0 - 36.0 (g/dL)   RDW 01.0 (*) 27.2 - 15.5 (%)   Platelets 209  150 - 400 (K/uL)   Neutrophils Relative 59  43 - 77 (%)   Neutro Abs 5.5  1.7 - 7.7 (K/uL)   Lymphocytes Relative 28  12 - 46 (%)   Lymphs Abs 2.7  0.7 -  4.0 (K/uL)   Monocytes Relative 8  3 - 12 (%)   Monocytes Absolute 0.8  0.1 - 1.0 (K/uL)   Eosinophils Relative 4  0 - 5 (%)   Eosinophils Absolute 0.4  0.0 - 0.7 (K/uL)   Basophils Relative 0  0 - 1 (%)   Basophils Absolute 0.0  0.0 - 0.1 (K/uL)   Smear Review Criteria for review not met    MAGNESIUM      Component Value Range   Magnesium 2.0  1.5 - 2.5 (mg/dL)  TSH      Component Value Range   TSH 2.253  0.350 - 4.500 (uIU/mL)  LIPID PANEL      Component Value Range   Cholesterol 239 (*) 0 - 200 (mg/dL)   Triglycerides 161 (*) <150 (mg/dL)   HDL 37 (*) >09 (mg/dL)   Total CHOL/HDL Ratio 6.5     VLDL NOT CALC  0 - 40 (mg/dL)   LDL Cholesterol      HEMOGLOBIN A1C      Component Value Range   Hemoglobin A1C 6.3 (*) <5.7 (%)   Mean Plasma Glucose 134 (*) <117 (mg/dL)   Assessment & Plan:  #1Hypertension. Hypertension is under better control 137/81. Continue him on Azor 40/12.5. When he returns in 4 months and except the current level and then may consider bumping up to 40/25. #2Hyperlipedemia. Since he felt abdominal fullness I will stop the TriCor and instead with the Borderline A1c we'll place him on  WelChol 375 3 capsules twice a day. When he returns in 4 months we'll recheck his triglycerides and cholesterol at that time. No. #3 atypical chest pain EKG showed bradycardia right bundle-branch lock no ischemic changes but since we are now treating him for elevated cholesterol and hypertension we'll proceed with a stress test.

## 2012-01-30 ENCOUNTER — Encounter: Payer: Self-pay | Admitting: *Deleted

## 2012-02-14 ENCOUNTER — Telehealth: Payer: Self-pay | Admitting: *Deleted

## 2012-02-14 MED ORDER — TELMISARTAN-HCTZ 80-25 MG PO TABS: 1.0000 | ORAL_TABLET | Freq: Every day | ORAL | Status: DC

## 2012-02-14 NOTE — Telephone Encounter (Signed)
Benicar not covered so changed to Micardis.

## 2012-02-16 ENCOUNTER — Ambulatory Visit (INDEPENDENT_AMBULATORY_CARE_PROVIDER_SITE_OTHER): Payer: Commercial Indemnity | Admitting: Licensed Clinical Social Worker

## 2012-02-16 DIAGNOSIS — F339 Major depressive disorder, recurrent, unspecified: Secondary | ICD-10-CM

## 2012-02-16 DIAGNOSIS — F329 Major depressive disorder, single episode, unspecified: Secondary | ICD-10-CM

## 2012-02-16 NOTE — Progress Notes (Signed)
Presenting Problem Chief Complaint:  Nathan [name] having difficulty with depression mainly because not able to work and dealing with chronic pain.  He hurt his elbow - work related and not able to be Nathan [name]. Having problems with workmen's comp - getting things settled  He is married for the 2nd time for 10 years but have been together for about 20 - having some problems for the past two years which as far as he is concerned is because of the stress her daughters puts her under.  She lost her son  couple of years ag0 and her daughters are always needing something.  He feels that they are in between their relationship.  Otherwise he feels they have a good relationship.  He takes car of his son because he is home now so he is the primary caretaker.  He feels he has more support from his family than she does with hers - her parents are dead and her brother is not really a biological brother and she has two sisters not in the area.  He has a brother close by with his wife. His parents are alive.  He has been in the states slice he was 81 - he lived in [place] for 12 years and went back to [place] and tried to make a life and it did not work out, so he was there for three years and then he came [place] the states.    He is close to his son Nathan [name].  He has been marred before and he has three children from that marriage.   What are the main stressors in your life right now, how long? Depression  2  Probably since his injury at work 5 years ago  Previous mental health services Have you ever been treated for a mental health problem, when, where, by whom? Yes  Saw Elray Buba 2 years ago   Are you currently seeing a therapist or counselor, counselor's name? No   Have you ever had a mental health hospitalization, how many times, length of stay? No   Have you ever been treated with medication, name, reason, response? Yes   Have you ever had suicidal thoughts or attempted suicide, when, how? No   Risk  factors for Suicide Demographic factors:  Male and Unemployed Current mental status: None Loss factors: Decrease in vocational status, Decline in physical health and Legal issues Historical factors: None Risk Reduction factors: Responsible for children under 21 years of age and Living with another person, especially a relative Clinical factors:  Depression:   Insomnia Chronic Pain Cognitive features that contribute to risk: Closed-mindedness    SUICIDE RISK:  Minimal: No identifiable suicidal ideation.  Patients presenting with no risk factors but with morbid ruminations; may be classified as minimal risk based on the severity of the depressive symptoms  Medical history Medical treatment and/or problems, explain: Yes Problem with injury from work - his elbow - preventing him from returning to work Do you have any issues with chronic pain?  Yes From the injury Name of primary care physician/last physical exam: Dr. Linford Arnold or Dr. Thurmond Butts  Allergies: Yes Medication, reactions? See allergy section   Current medications: See medication section Prescribed by: Dr. Eppie Gibson, Dr. Laury Deep Is there any history of mental health problems or substance abuse in your family, whom? No  Has anyone in your family been hospitalized, who, where, length of stay? No   Social/family history Have you been married, how many times?  Twice - first  marriage was for 25 years and he has 3 children from that marriage.  Second marriage has been for 10 years but have been together for 20 years.    Do you have children?  3 children from first - Alinda Money age 35, Louis age 44, [place] age 2  ! Child from second marriage  Nathan [name] age 38  How many pregnancies have you had?  None  Who lives in your current household? Other lives with wife Nathan [name] and son Recruitment consultant history: No  Religious/spiritual involvement:  What religion/faith base are you?  Catholic  Family of origin (childhood history)  Where were you born?  [place] Where did you grow up? [place] - came to the [place] at age 69 How many different homes have you lived? one Describe the atmosphere of the household where you grew up:  Do you have siblings, step/half siblings, list names, relation, sex, age? Yes one older brother who is here in the [place]  Are your parents separated/divorced, when and why? No  Are your parents alive? Yes  Social supports (personal and professional): His brother, wife  Education How many grades have you completed? college graduate Did you have any problems in school, what type? No  Medications prescribed for these problems? No   Employment (financial issues) He is on disability from his job for he hurt his elbow which prevents him from being able to do his job.  It was a workplace injury.   Legal history - none   Trauma/Abuse history: Have you ever been exposed to any form of abuse, what type? No   Have you ever been exposed to something traumatic, describe? No   Substance use Do you use Caffeine? Yes Type, frequency? *coffee  Do you use Nicotine? Yes Type, frequency, ppd?  Quit in 1993 Do you use Alcohol? Yes Type, frequency? Liquor .5 oz per drink How old were you went you first tasted alcohol? Teen ager Was this accepted by your family? No  When was your last drink, type, how much? ?  Have you ever used illicit drugs or taken more than prescribed, type, frequency, date of last usage? No   Mental Status: General Appearance /Behavior:  Casual Eye Contact:  Good Motor Behavior:  Normal Speech:  Normal Level of Consciousness:  Alert Mood:  Depressed Affect:  Appropriate Anxiety Level:  Minimal Thought Process:  Coherent and Relevant Thought Content:  WNL Perception:  Normal Judgment:  Fair Insight:  Absent Cognition:  Orientation time, place and person  Diagnosis AXIS I Depressive Disorder NOS  AXIS II Deferred  AXIS III Past Medical History  Diagnosis Date  . Depression     AXIS IV  economic problems, occupational problems and problems with primary support group  AXIS V 51-60 moderate symptoms   Plan: Meet weekly at first - develop treatment plan  _________________________________________           Merlene Morse, LCSW/ Date  02/16/2012

## 2012-02-20 ENCOUNTER — Encounter: Payer: Self-pay | Admitting: Family Medicine

## 2012-02-20 ENCOUNTER — Ambulatory Visit
Admission: RE | Admit: 2012-02-20 | Discharge: 2012-02-20 | Disposition: A | Discharge status: Home / Self Care | Payer: Commercial Indemnity | Source: Ambulatory Visit | Attending: Family Medicine | Admitting: Family Medicine

## 2012-02-20 ENCOUNTER — Ambulatory Visit (INDEPENDENT_AMBULATORY_CARE_PROVIDER_SITE_OTHER): Payer: 59 | Admitting: Family Medicine

## 2012-02-20 VITALS — BP 129/79 | HR 64 | Temp 98.1°F | Ht 66.0 in | Wt 181.0 lb

## 2012-02-20 DIAGNOSIS — R1032 Left lower quadrant pain: Secondary | ICD-10-CM

## 2012-02-20 LAB — CBC WITH DIFFERENTIAL/PLATELET
Basophils Absolute: 0 10*3/uL (ref 0.0–0.1)
Basophils Relative: 0 % (ref 0–1)
Eosinophils Absolute: 0.8 10*3/uL — ABNORMAL HIGH (ref 0.0–0.7)
Eosinophils Relative: 8 % — ABNORMAL HIGH (ref 0–5)
HCT: 46.6 % (ref 39.0–52.0)
Hemoglobin: 15.8 g/dL (ref 13.0–17.0)
Lymphocytes Relative: 25 % (ref 12–46)
Lymphs Abs: 2.2 10*3/uL (ref 0.7–4.0)
MCH: 28.7 pg (ref 26.0–34.0)
MCHC: 33.9 g/dL (ref 30.0–36.0)
MCV: 84.6 fL (ref 78.0–100.0)
Monocytes Absolute: 0.7 10*3/uL (ref 0.1–1.0)
Monocytes Relative: 7 % (ref 3–12)
Neutro Abs: 5.4 10*3/uL (ref 1.7–7.7)
Neutrophils Relative %: 60 % (ref 43–77)
Platelets: 226 10*3/uL (ref 150–400)
RBC: 5.51 MIL/uL (ref 4.22–5.81)
RDW: 15.2 % (ref 11.5–15.5)
WBC: 9 10*3/uL (ref 4.0–10.5)

## 2012-02-20 LAB — AMYLASE: Amylase: 105 U/L (ref 0–105)

## 2012-02-20 LAB — POCT URINALYSIS DIPSTICK
Bilirubin, UA: NEGATIVE
Blood, UA: NEGATIVE
Glucose, UA: NEGATIVE
Ketones, UA: NEGATIVE
Nitrite, UA: NEGATIVE
Protein, UA: NEGATIVE
Spec Grav, UA: 1.01
Urobilinogen, UA: 0.2
pH, UA: 7

## 2012-02-20 LAB — LIPASE: Lipase: 36 U/L (ref 0–75)

## 2012-02-20 MED ORDER — AMOXICILLIN-POT CLAVULANATE 875-125 MG PO TABS: 1.0000 | ORAL_TABLET | Freq: Two times a day (BID) | ORAL | Status: DC

## 2012-02-20 MED ORDER — IOHEXOL 300 MG/ML  SOLN
20.0000 mL | INTRAMUSCULAR | Status: AC
  Administered 2012-02-20: 20 mL via ORAL

## 2012-02-20 MED ORDER — IOHEXOL 300 MG/ML  SOLN
100.0000 mL | Freq: Once | INTRAMUSCULAR | Status: AC | PRN
  Administered 2012-02-20: 100 mL via INTRAVENOUS

## 2012-02-20 MED ORDER — METRONIDAZOLE 500 MG PO TABS: 500.0000 mg | ORAL_TABLET | Freq: Two times a day (BID) | ORAL | Status: AC

## 2012-02-20 MED ORDER — HYDROCODONE-ACETAMINOPHEN 5-325 MG PO TABS: 1.0000 | ORAL_TABLET | ORAL | Status: DC | PRN

## 2012-02-20 MED ORDER — ONDANSETRON 8 MG PO TBDP: 8.0000 mg | ORAL_TABLET | Freq: Three times a day (TID) | ORAL | Status: AC | PRN

## 2012-02-20 NOTE — Progress Notes (Signed)
  Subjective:    Patient ID: Nathan [name], male    DOB: 30-Dec-1950, 61 y.o.   MRN: [id number]  Abdominal Pain This is a new problem. The current episode started 1 to 4 weeks ago (10 days ago). The onset quality is gradual. The problem occurs intermittently. Duration: undeterminite. The problem has been waxing and waning. The pain is located in the [name] and left flank. The pain is moderate. The quality of the pain is cramping and aching. The abdominal pain radiates to the pelvis. Associated symptoms include constipation, flatus and nausea. Pertinent negatives include no anorexia, arthralgias, belching, dysuria, fever, frequency, headaches, hematochezia, hematuria, melena, myalgias, vomiting or weight loss. The pain is aggravated by nothing. The pain is relieved by nothing. Prior workup: flat plate of abdomen. There is no history of abdominal surgery, colon cancer, Crohn's disease, gallstones, GERD, irritable bowel syndrome, pancreatitis, PUD or ulcerative colitis. kidney stones and diverticuli   Hypertension recently had to change BP medication   Review of Systems  Constitutional: Negative for fever and weight loss.  Gastrointestinal: Positive for nausea, abdominal pain, constipation and flatus. Negative for vomiting, melena, hematochezia and anorexia.  Genitourinary: Negative for dysuria, frequency and hematuria.  Musculoskeletal: Negative for myalgias and arthralgias.  Neurological: Negative for headaches.  All other systems reviewed and are negative.   BP 129/79  Pulse 64  Temp(Src) 98.1 F (36.7 C) (Oral)  Ht 5\' 6"  (1.676 m)  Wt 181 lb (82.101 kg)  BMI 29.21 kg/m2  SpO2 96%    Objective:   Physical Exam  Vitals reviewed. Constitutional: He is oriented to person, place, and time. He appears well-developed and well-nourished.  HENT:  Head: Normocephalic.  Abdominal: Bowel sounds are normal. He exhibits no mass. There is tenderness. There is no guarding.  Neurological: He is alert  and oriented to person, place, and time.  Skin: Skin is warm. No rash noted. No erythema.     Assessment & Plan:  Abdominal pain etiology not clear possible kidney stone but could be first diverticulitis since he was diagnosed diverticuli when he is colonoscopy 2 years ago. We'll give pain medication Vicodin since tramadol has not worked well for him and he is allergic to [employer]. If diverticulitis is present will treat with Augmentin and Flagyl twice a day for 10 days and Zofran for nausea. We'll get CT scan for evaluation.  #2 hypertension. Blood pressure is under good control despite recent switching of medication return for followup as scheduled.

## 2012-02-20 NOTE — Patient Instructions (Signed)
Kidney Stones Kidney stones (ureteral lithiasis) are deposits that form inside your kidneys. The intense pain is caused by the stone moving through the urinary tract. When the stone moves, the ureter goes into spasm around the stone. The stone is usually passed in the urine.  CAUSES   A disorder that makes certain neck glands produce too much parathyroid hormone (primary hyperparathyroidism).   A buildup of uric acid crystals.   Narrowing (stricture) of the ureter.   A kidney obstruction present at birth (congenital obstruction).   Previous surgery on the kidney or ureters.   Numerous kidney infections.  SYMPTOMS   Feeling sick to your stomach (nauseous).   Throwing up (vomiting).   Blood in the urine (hematuria).   Pain that usually spreads (radiates) to the groin.   Frequency or urgency of urination.  DIAGNOSIS   Taking a history and physical exam.   Blood or urine tests.   Computerized X-ray scan (CT scan).   Occasionally, an examination of the inside of the urinary bladder (cystoscopy) is performed.  TREATMENT   Observation.   Increasing your fluid intake.   Surgery may be needed if you have severe pain or persistent obstruction.  The size, location, and chemical composition are all important variables that will determine the proper choice of action for you. Talk to your caregiver to better understand your situation so that you will minimize the risk of injury to yourself and your kidney.  HOME CARE INSTRUCTIONS   Drink enough water and fluids to keep your urine clear or pale yellow.   Strain all urine through the provided strainer. Keep all particulate matter and stones for your caregiver to see. The stone causing the pain may be as small as a grain of salt. It is very important to use the strainer each and every time you pass your urine. The collection of your stone will allow your caregiver to analyze it and verify that a stone has actually passed.   Only take  over-the-counter or prescription medicines for pain, discomfort, or fever as directed by your caregiver.   Make a follow-up appointment with your caregiver as directed.   Get follow-up X-rays if required. The absence of pain does not always mean that the stone has passed. It may have only stopped moving. If the urine remains completely obstructed, it can cause loss of kidney function or even complete destruction of the kidney. It is your responsibility to make sure X-rays and follow-ups are completed. Ultrasounds of the kidney can show blockages and the status of the kidney. Ultrasounds are not associated with any radiation and can be performed easily in a matter of minutes.  SEEK IMMEDIATE MEDICAL CARE IF:   Pain cannot be controlled with the prescribed medicine.   You have a fever.   The severity or intensity of pain increases over 18 hours and is not relieved by pain medicine.   You develop a new onset of abdominal pain.   You feel faint or pass out.  MAKE SURE YOU:   Understand these instructions.   Will watch your condition.   Will get help right away if you are not doing well or get worse.  Document Released: 09/11/2005 Document Revised: 08/31/2011 Document Reviewed: 01/07/2010 [facility] Patient Information 2012 [place], [place].Diverticulitis A diverticulum is a small pouch or sac on the colon. Diverticulosis is the presence of these diverticula on the colon. Diverticulitis is the irritation (inflammation) or infection of diverticula. CAUSES  The colon and its diverticula contain  bacteria. If food particles block the tiny opening to a diverticulum, the bacteria inside can grow and cause an increase in pressure. This leads to infection and inflammation and is called diverticulitis. SYMPTOMS   Abdominal pain and tenderness. Usually, the pain is located on the left side of your abdomen. However, it could be located elsewhere.   Fever.   Bloating.   Feeling sick to your stomach  (nausea).   Throwing up (vomiting).   Abnormal stools.  DIAGNOSIS  Your caregiver will take a history and perform a physical exam. Since many things can cause abdominal pain, other tests may be necessary. Tests may include:  Blood tests.   Urine tests.   X-ray of the abdomen.   CT scan of the abdomen.  Sometimes, surgery is needed to determine if diverticulitis or other conditions are causing your symptoms. TREATMENT  Most of the time, you can be treated without surgery. Treatment includes:  Resting the bowels by only having liquids for a few days. As you improve, you will need to eat a low-fiber diet.   Intravenous (IV) fluids if you are losing body fluids (dehydrated).   Antibiotic medicines that treat infections may be given.   Pain and nausea medicine, if needed.   Surgery if the inflamed diverticulum has burst.  HOME CARE INSTRUCTIONS   Try a clear liquid diet (broth, tea, or water for as long as directed by your caregiver). You may then gradually begin a low-fiber diet as tolerated. A low-fiber diet is a diet with less than 10 grams of fiber. Choose the foods below to reduce fiber in the diet:   White breads, cereals, rice, and pasta.   Cooked fruits and vegetables or soft fresh fruits and vegetables without the skin.   Ground or well-cooked tender beef, ham, veal, lamb, pork, or poultry.   Eggs and seafood.   After your diverticulitis symptoms have improved, your caregiver may put you on a high-fiber diet. A high-fiber diet includes 14 grams of fiber for every 1000 calories consumed. For a standard 2000 calorie diet, you would need 28 grams of fiber. Follow these diet guidelines to help you increase the fiber in your diet. It is important to slowly increase the amount fiber in your diet to avoid gas, constipation, and bloating.   Choose whole-grain breads, cereals, pasta, and brown rice.   Choose fresh fruits and vegetables with the skin on. Do not overcook  vegetables because the more vegetables are cooked, the more fiber is lost.   Choose more nuts, seeds, legumes, dried peas, beans, and lentils.   Look for food products that have greater than 3 grams of fiber per serving on the Nutrition Facts label.   Take all medicine as directed by your caregiver.   If your caregiver has given you a follow-up appointment, it is very important that you go. Not going could result in lasting (chronic) or permanent injury, pain, and disability. If there is any problem keeping the appointment, call to reschedule.  SEEK MEDICAL CARE IF:   Your pain does not improve.   You have a hard time advancing your diet beyond clear liquids.   Your bowel movements do not return to normal.  SEEK IMMEDIATE MEDICAL CARE IF:   Your pain becomes worse.   You have an oral temperature above 102 F (38.9 C), not controlled by medicine.   You have repeated vomiting.   You have bloody or black, tarry stools.   Symptoms that brought you to  your caregiver become worse or are not getting better.  MAKE SURE YOU:   Understand these instructions.   Will watch your condition.   Will get help right away if you are not doing well or get worse.  Document Released: 06/21/2005 Document Revised: 08/31/2011 Document Reviewed: 10/17/2010 [facility] Patient Information 2012 [place], [place].Diverticulosis Diverticulosis is a common condition that develops when small pouches (diverticula) form in the wall of the colon. The risk of diverticulosis increases with age. It happens more often in people who eat a low-fiber diet. Most individuals with diverticulosis have no symptoms. Those individuals with symptoms usually experience abdominal pain, constipation, or loose stools (diarrhea). HOME CARE INSTRUCTIONS   Increase the amount of fiber in your diet as directed by your caregiver or dietician. This may reduce symptoms of diverticulosis.   Your caregiver may recommend taking a dietary  fiber supplement.   Drink at least 6 to 8 glasses of water each day to prevent constipation.   Try not to strain when you have a bowel movement.   Your caregiver may recommend avoiding nuts and seeds to prevent complications, although this is still an uncertain benefit.   Only take over-the-counter or prescription medicines for pain, discomfort, or fever as directed by your caregiver.  FOODS WITH HIGH FIBER CONTENT INCLUDE:  Fruits. Apple, peach, pear, tangerine, raisins, prunes.   Vegetables. Brussels sprouts, asparagus, broccoli, cabbage, carrot, cauliflower, romaine lettuce, spinach, summer squash, tomato, winter squash, zucchini.   Starchy Vegetables. Baked beans, kidney beans, lima beans, split peas, lentils, potatoes (with skin).   Grains. Whole wheat bread, brown rice, bran flake cereal, plain oatmeal, white rice, shredded wheat, bran muffins.  SEEK IMMEDIATE MEDICAL CARE IF:   You develop increasing pain or severe bloating.   You have an oral temperature above 102 F (38.9 C), not controlled by medicine.   You develop vomiting or bowel movements that are bloody or black.  Document Released: 06/08/2004 Document Revised: 08/31/2011 Document Reviewed: 02/09/2010 [facility] Patient Information 2012 [place], [place].Abdominal Pain Abdominal pain can be caused by many things. Your caregiver decides the seriousness of your pain by an examination and possibly blood tests and X-rays. Many cases can be observed and treated at home. Most abdominal pain is not caused by a disease and will probably improve without treatment. However, in many cases, more time must pass before a clear cause of the pain can be found. Before that point, it may not be known if you need more testing, or if hospitalization or surgery is needed. HOME CARE INSTRUCTIONS   Do not take laxatives unless directed by your caregiver.   Take pain medicine only as directed by your caregiver.   Only take over-the-counter or  prescription medicines for pain, discomfort, or fever as directed by your caregiver.   Try a clear liquid diet (broth, tea, or water) for as long as directed by your caregiver. Slowly move to a bland diet as tolerated.  SEEK IMMEDIATE MEDICAL CARE IF:   The pain does not go away.   You have a fever.   You keep throwing up (vomiting).   The pain is felt only in portions of the abdomen. Pain in the right side could possibly be appendicitis. In an adult, pain in the left lower portion of the abdomen could be colitis or diverticulitis.   You pass bloody or black tarry stools.  MAKE SURE YOU:   Understand these instructions.   Will watch your condition.   Will get help right away  if you are not doing well or get worse.  Document Released: 06/21/2005 Document Revised: 08/31/2011 Document Reviewed: 04/29/2008 [facility] Patient Information 2012 [place], [place].

## 2012-02-21 LAB — H. PYLORI ANTIBODY, IGG: H Pylori IgG: 2.41 {ISR} — ABNORMAL HIGH

## 2012-02-22 ENCOUNTER — Other Ambulatory Visit: Payer: Self-pay | Admitting: *Deleted

## 2012-02-22 MED ORDER — LANSOPRAZOLE 30 MG PO CPDR: 30.0000 mg | DELAYED_RELEASE_CAPSULE | Freq: Every day | ORAL | Status: DC

## 2012-02-23 ENCOUNTER — Telehealth: Payer: Self-pay | Admitting: *Deleted

## 2012-02-23 NOTE — Telephone Encounter (Signed)
Agree thank you 

## 2012-02-23 NOTE — Telephone Encounter (Signed)
Pt came in office stating that he had a reaction that was causing him to be itchy. Pt states that when he had Percocet after surgery before that he has a reaction to that also. I have placed the hydrocodone-acetaminophen as a reaction for him. Told him he could take some benadryl and that would help with the itching. Informed pt also not to take the pain med anymore.

## 2012-02-26 ENCOUNTER — Emergency Department (INDEPENDENT_AMBULATORY_CARE_PROVIDER_SITE_OTHER)
Admission: EM | Admit: 2012-02-26 | Discharge: 2012-02-26 | Disposition: A | Discharge status: Home / Self Care | Payer: Commercial Indemnity | Source: Home / Self Care | Attending: Family Medicine | Admitting: Family Medicine

## 2012-02-26 ENCOUNTER — Encounter: Payer: Self-pay | Admitting: Emergency Medicine

## 2012-02-26 ENCOUNTER — Other Ambulatory Visit (HOSPITAL_COMMUNITY): Payer: Self-pay | Admitting: Psychiatry

## 2012-02-26 DIAGNOSIS — R109 Unspecified abdominal pain: Secondary | ICD-10-CM

## 2012-02-26 DIAGNOSIS — K5792 Diverticulitis of intestine, part unspecified, without perforation or abscess without bleeding: Secondary | ICD-10-CM

## 2012-02-26 DIAGNOSIS — K5732 Diverticulitis of large intestine without perforation or abscess without bleeding: Secondary | ICD-10-CM

## 2012-02-26 HISTORY — DX: Essential (primary) hypertension: I10

## 2012-02-26 HISTORY — DX: Calculus of kidney: N20.0

## 2012-02-26 HISTORY — DX: Diverticulitis of intestine, part unspecified, without perforation or abscess without bleeding: K57.92

## 2012-02-26 HISTORY — DX: Disorder of kidney and ureter, unspecified: N28.9

## 2012-02-26 LAB — POCT URINALYSIS DIPSTICK
Bilirubin, UA: NEGATIVE
Blood, UA: NEGATIVE
Glucose, UA: NEGATIVE
Ketones, UA: NEGATIVE
Nitrite, UA: NEGATIVE
Spec Grav, UA: 1.02 (ref 1.005–1.03)
Urobilinogen, UA: 0.2 (ref 0–1)
pH, UA: 6.5 (ref 5–8)

## 2012-02-26 LAB — POCT CBC W AUTO DIFF (K'VILLE URGENT CARE)

## 2012-02-26 MED ORDER — CIPROFLOXACIN HCL 750 MG PO TABS: 750.0000 mg | ORAL_TABLET | Freq: Two times a day (BID) | ORAL | Status: AC

## 2012-02-26 MED ORDER — SERTRALINE HCL 50 MG PO TABS: 50.0000 mg | ORAL_TABLET | Freq: Every day | ORAL | Status: DC

## 2012-02-26 MED ORDER — METRONIDAZOLE 500 MG PO TABS: ORAL_TABLET | ORAL | Status: DC

## 2012-02-26 NOTE — ED Provider Notes (Signed)
History     CSN: [id number]  Arrival date & time 02/26/12  1639   First MD Initiated Contact with Patient 02/26/12 1655      Chief Complaint  Patient presents with  . Abdominal Pain      HPI Comments: Patient was treated for diverticulitis on 02/20/12 with Augmentin and Metronidazole.  He was also started on Prevacid.  Last night he developed a vague abdominal burning sensation and today has had a similar symptom in the peri-umbilical region.  He has had nausea without vomiting.  His stools have been somewhat loose but no melena or hematochezia.  He has had chills over the past several days.   His abdominal/pelvis CT scan on 02/20/12 showed mild diverticulitis in the proximal sigmoid colon. He has a past history of ulcer.  Patient is a 61 y.o. male presenting with abdominal pain. The history is provided by the patient.  Abdominal Pain The primary symptoms of the illness include abdominal pain, fatigue and nausea. The primary symptoms of the illness do not include fever, shortness of breath, vomiting, diarrhea, hematemesis, hematochezia or dysuria. Episode onset: over 2 weeks ago. The onset of the illness was gradual. The problem has not changed since onset. The patient has had a change in bowel habit. Additional symptoms associated with the illness include chills, anorexia and heartburn. Symptoms associated with the illness do not include diaphoresis, constipation, urgency, hematuria, frequency or back pain.    Past Medical History  Diagnosis Date  . Depression   . Diverticulitis   . Hypertension   . Renal disorder   . Renal calculi     Past Surgical History  Procedure Date  . Joint replacement   . Rotary cuff repair   . Ulnar nerve repair     Family History  Problem Relation Age of Onset  . Cancer Father     History  Substance Use Topics  . Smoking status: Never Smoker   . Smokeless tobacco: Never Used  . Alcohol Use: No      Review of Systems  Constitutional:  Positive for chills and fatigue. Negative for fever and diaphoresis.  Respiratory: Negative for shortness of breath.   Gastrointestinal: Positive for heartburn, nausea, abdominal pain and anorexia. Negative for vomiting, diarrhea, constipation, hematochezia and hematemesis.  Genitourinary: Negative for dysuria, urgency, frequency and hematuria.  Musculoskeletal: Negative for back pain.  All other systems reviewed and are negative.    Allergies  Hydrocodone-acetaminophen  Home Medications   Current Outpatient Rx  Name Route Sig Dispense Refill  . CIPROFLOXACIN HCL 750 MG PO TABS Oral Take 1 tablet (750 mg total) by mouth 2 (two) times daily. 14 tablet 0  . CLONAZEPAM 0.5 MG PO TABS Oral Take 1 tablet (0.5 mg total) by mouth 2 (two) times daily as needed. 30 tablet 2  . COLESEVELAM HCL 625 MG PO TABS Oral Take 3 tablets (1,875 mg total) by mouth 2 (two) times daily with a meal. 180 tablet 11  . FENOFIBRATE 145 MG PO TABS      . HYDROCODONE-ACETAMINOPHEN 5-325 MG PO TABS Oral Take 1 tablet by mouth every 4 (four) hours as needed for pain. 20 tablet 0  . LANSOPRAZOLE 30 MG PO CPDR Oral Take 1 capsule (30 mg total) by mouth daily. 30 capsule 2  . LEVETIRACETAM 250 MG PO TABS Oral Take 500 mg by mouth every 12 (twelve) hours.    [name] LUMIGAN 0.01 % OP SOLN      . METRONIDAZOLE 500  MG PO TABS Oral Take 1 tablet (500 mg total) by mouth 2 (two) times daily. 20 tablet 0  . METRONIDAZOLE 500 MG PO TABS  Take one tab by mouth every 6 hours 21 tablet 0  . NAPROXEN 250 MG PO TABS Oral Take 250 mg by mouth 2 (two) times daily with a meal.    . ONDANSETRON 8 MG PO TBDP Oral Take 1 tablet (8 mg total) by mouth every 8 (eight) hours as needed for nausea. 20 tablet 0  . SERTRALINE HCL 50 MG PO TABS Oral Take 1 tablet (50 mg total) by mouth daily. Pt's Zoloft was decreased in August. 30 tablet 5  . TELMISARTAN-HCTZ 80-25 MG PO TABS Oral Take 1 tablet by mouth daily. 30 tablet 2  . TRAZODONE HCL 100 MG PO TABS  Oral Take 100 mg by mouth at bedtime.        BP 128/85  Pulse 88  Temp(Src) 98.2 F (36.8 C) (Oral)  Resp 16  Ht 5\' 6"  (1.676 m)  Wt 176 lb (79.833 kg)  BMI 28.41 kg/m2  SpO2 95%  Physical Exam Nursing notes and Vital Signs reviewed. Appearance:  Patient appears stated age, and in no acute distress Eyes:  Pupils are equal, round, and reactive to light and accomodation.  Extraocular movement is intact.  Conjunctivae are not inflamed  Ears:  Canals normal.  Tympanic membranes normal.  Nose:  Mildly congested turbinates.  No sinus tenderness.    Pharynx:  Normal; moist mucous membranes  Neck:  Supple.  No adenopathy or thyromegaly Lungs:  Clear to auscultation.  Breath sounds are equal.  Heart:  Regular rate and rhythm without murmurs, rubs, or gallops.  Abdomen:  Vague mild tenderness in the left lower quadrant without masses or hepatosplenomegaly.  Bowel sounds are present.  No CVA or flank tenderness.  Extremities:  No edema.  No calf tenderness Skin:  No rash present.   ED Course  Procedures none   Labs Reviewed  POCT URINALYSIS DIPSTICK trace leuks, otherwise negative  POCT CBC W AUTO DIFF ([facility]) :  WBC 10.6; LY 27.0; MO 4.4; GR 68.6; Hgb 16.7; Platelets 206   COMPLETE METABOLIC PANEL WITH GFR pending      1. Abdominal pain   2. Diverticulitis; note increase in WBC from 9.0 on 02/20/12 to WBC 10.6 today.      MDM  To give broader antibiotic coverage, will stop Augmentin and Begin Cipro.  Continue Flagyl but increase to QID. CMP pending May continue Prevacid Continue clear liquids today and tomorrow, then advance to a BRAT diet.  Gradually resume a regular diet as tolerated. Followup with Family Doctor in one week, earlier if symptoms worsen         Lattie Haw, MD 02/27/12 1505

## 2012-02-26 NOTE — ED Notes (Signed)
Reports being on basic liquid/bland diet.

## 2012-02-26 NOTE — Discharge Instructions (Signed)
Stop Augmentin.  May continue Prevacid Continue clear liquids today and tomorrow, then advance to a BRAT diet.  Gradually resume a regular diet as tolerated.

## 2012-02-26 NOTE — ED Notes (Signed)
History of approximately 3 weeks with various abdominal discomforts and evaluations through [facility]. Taking current meds for final dx of 'diverticulitis' with breakthrough pain.

## 2012-02-27 LAB — COMPLETE METABOLIC PANEL WITH GFR
ALT: 39 U/L (ref 0–53)
AST: 33 U/L (ref 0–37)
Albumin: 4.6 g/dL (ref 3.5–5.2)
Alkaline Phosphatase: 83 U/L (ref 39–117)
BUN: 10 mg/dL (ref 6–23)
CO2: 24 mEq/L (ref 19–32)
Calcium: 10 mg/dL (ref 8.4–10.5)
Chloride: 101 mEq/L (ref 96–112)
Creat: 0.84 mg/dL (ref 0.50–1.35)
GFR, Est African American: >89 mL/min
GFR, Est Non African American: >89 mL/min
Glucose, Bld: 98 mg/dL (ref 70–99)
Potassium: 4.2 mEq/L (ref 3.5–5.3)
Sodium: 138 mEq/L (ref 135–145)
Total Bilirubin: 0.8 mg/dL (ref 0.3–1.2)
Total Protein: 7.9 g/dL (ref 6.0–8.3)

## 2012-02-29 ENCOUNTER — Ambulatory Visit: Payer: Self-pay | Admitting: Family Medicine

## 2012-03-01 ENCOUNTER — Telehealth (HOSPITAL_COMMUNITY): Payer: Self-pay

## 2012-03-01 ENCOUNTER — Ambulatory Visit (INDEPENDENT_AMBULATORY_CARE_PROVIDER_SITE_OTHER): Payer: Commercial Indemnity | Admitting: Psychiatry

## 2012-03-01 ENCOUNTER — Encounter (HOSPITAL_COMMUNITY): Payer: Self-pay | Admitting: Psychiatry

## 2012-03-01 VITALS — BP 150/85 | HR 84 | Ht 66.0 in | Wt 180.0 lb

## 2012-03-01 DIAGNOSIS — F329 Major depressive disorder, single episode, unspecified: Secondary | ICD-10-CM

## 2012-03-01 DIAGNOSIS — F339 Major depressive disorder, recurrent, unspecified: Secondary | ICD-10-CM

## 2012-03-01 MED ORDER — SERTRALINE HCL 100 MG PO TABS: 100.0000 mg | ORAL_TABLET | Freq: Every day | ORAL | Status: DC

## 2012-03-01 MED ORDER — TRAZODONE HCL 100 MG PO TABS: ORAL_TABLET | ORAL | Status: DC

## 2012-03-01 MED ORDER — SERTRALINE HCL 50 MG PO TABS: 50.0000 mg | ORAL_TABLET | Freq: Every day | ORAL | Status: DC

## 2012-03-01 NOTE — Telephone Encounter (Signed)
Would like to speak to you... Wouldn't disclose why

## 2012-03-01 NOTE — Telephone Encounter (Signed)
[name] wife and child have left him. Since I treat both husband and wife, I will transfer his care to Dr. Laury Deep. Patient to see Dr. Laury Deep today at 1 PM for a new patient appointment.

## 2012-03-01 NOTE — Progress Notes (Signed)
Psychiatric Assessment Adult  Patient Identification:  Nathan [name] Date of Evaluation:  03/01/2012 Chief Complaint:   Chief Complaint  Patient presents with  . Depression   History of Chief Complaint:   HPI Comments: Nathan [name] is a 61 y/o male with a past psychiatric history significant for Major Depressive Disorder. The patient is referred for psychiatric services for psychiatric evaluation and medication. The patient reports that his main stressors are his current situation with his wife.  The patient reports that his wife took his son out of school and has gone away. He states that he has been with his wife for 19 years relationship and was married for 10 years.  He reports believes his wife had started to get depressed 2 years ago after her son from another marriage who was 22 died in a car accident. The patient reports that "sometimes I feel like I have been the scapegoat for her emotions."  He reports that her wife has left him 3 years ago when her daughter was pregnant. The patient reports that she moved back after 3 days. He states that he was happy she moved back in when she left him the first time. but feels that he does not want to try to reconcile at this time.   In the area of affective symptoms, patient appears depressed. Patient denies current suicidal ideation, intent, or plan. Patient denies current homicidal ideation, intent, or plan. Patient denies auditory hallucinations. Patient denies visual hallucinations. Patient denies symptoms of paranoia. Patient states sleep is poor, with approximately 4 hours of sleep per night with clonazepam and trazodone. Appetite is poor. Energy level is down. Patient endorses symptoms of anhedonia. Patient endorses hopelessness, helplessness, or guilt.   Denies any recent episodes consistent with mania, particularly decreased need for sleep with increased energy, grandiosity, impulsivity, hyperverbal and pressured speech, or increased  productivity. Denies any recent symptoms consistent with psychosis, particularly auditory or visual hallucinations, thought broadcasting/insertion/withdrawal, or ideas of reference. Also denies excessive worry to the point of physical symptoms as well as any panic attacks, he reports his last attack was " probably in 1980."  Denies any history of trauma or symptoms consistent with PTSD such as flashbacks, nightmares, hypervigilance, feelings of numbness or inability to connect with others.    Review of Systems  Respiratory: Negative.   Cardiovascular: Negative.   Gastrointestinal: Negative for nausea, vomiting, abdominal pain, diarrhea, constipation, blood in stool, abdominal distention, anal bleeding and rectal pain.  Hematological: Negative.    Filed Vitals:   03/01/12 1309  BP: 150/85  Pulse: 84  Height: 5\' 6"  (1.676 m)  Weight: 180 lb (81.647 kg)     Physical Exam  Vitals reviewed. Constitutional: He appears well-developed and well-nourished. No distress.  Skin: He is not diaphoretic.    Traumatic Brain Injury: No  Past Psychiatric History: Diagnosis: Major Depression Disorder.  Hospitalizations: Patient denies.  Outpatient Care: Patient denies.  Substance Abuse Care: Patient denies.  Self-Mutilation:Patient denies.  Suicidal Attempts: Patient denies.  Violent Behaviors: *Patient denies.   Past Medical History:   Diagnosis  . Depression  . Diverticulitis  . Hypertension  . Renal disorder  . Renal calculi  . Ligament tear of upper extremity     . Diverticulitis  . CRPS (complex regional pain syndrome), upper limb   History of Loss of Consciousness:  Yes-Had a panic attack in 1988 Seizure History:  No Cardiac History:  No  Allergies:   Allergies  Allergen Reactions  . Hydrocodone-Acetaminophen Itching  Current Medications:  Current Outpatient Prescriptions  Medication Sig Dispense Refill  . ciprofloxacin (CIPRO) 750 MG tablet Take 1 tablet (750 mg total)  by mouth 2 (two) times daily.  14 tablet  0  . clonazePAM (KLONOPIN) 0.5 MG tablet Take 1 tablet (0.5 mg total) by mouth 2 (two) times daily as needed.  30 tablet  2  . colesevelam (WELCHOL) 625 MG tablet Take 3 tablets (1,875 mg total) by mouth 2 (two) times daily with a meal.  180 tablet  11  . fenofibrate (TRICOR) 145 MG tablet       . HYDROcodone-acetaminophen (NORCO) 5-325 MG per tablet Take 1 tablet by mouth every 4 (four) hours as needed for pain.  20 tablet  0  . lansoprazole (PREVACID) 30 MG capsule Take 1 capsule (30 mg total) by mouth daily.  30 capsule  2  . levETIRAcetam (KEPPRA) 250 MG tablet Take 500 mg by mouth every 12 (twelve) hours.      [name] LUMIGAN 0.01 % SOLN       . metroNIDAZOLE (FLAGYL) 500 MG tablet Take one tab by mouth every 6 hours  21 tablet  0  . naproxen (NAPROSYN) 250 MG tablet Take 250 mg by mouth 2 (two) times daily with a meal.      . sertraline (ZOLOFT) 50 MG tablet Take 1 tablet (50 mg total) by mouth daily. Pt's Zoloft was decreased in August.  30 tablet  5  . telmisartan-hydrochlorothiazide (MICARDIS HCT) 80-25 MG per tablet Take 1 tablet by mouth daily.  30 tablet  2  . traZODone (DESYREL) 100 MG tablet Take 100 mg by mouth at bedtime.          Previous Psychotropic Medications:  Medication Dose   Sertraline  50-100 mg   Clonazepam  0.25 to 1 mg   Substance Abuse History in the last 12 months:  Caffeine: Coffee 1 cups/pots per day. Caffeinated Beverages 8-16  ounces per day. Nicotine:Patient denies.  Alcohol: Patient denies.  Illicit Drugs: Patient denies.   Social History: Current Place of Residence: [place], [place] Place of Birth:[place] Family Members: Patient lives by himself, he has four children 15 (F), 76 (M), 12 (F), and 8 (M) Marital Status:  Separated Children: 4  Sons:  95 (M) and 8 (M)  Daughters: 35 (F), 33 (F) Relationships: Has a close relationship  Education:  Print production planner Problems/Performance: Did well in  school Religious Beliefs/Practices: Prays. History of Abuse: none Occupational Experiences: Public house manager History:  None. Legal History: Currently Hobbies/Interests: Watching sports, painter   Family History:   Family History  Problem Relation Age of Onset  . Cancer Father     Mental Status Examination/Evaluation: Objective:  Appearance: Casual and Well Groomed  Eye Contact::  Good  Speech:  Clear and Coherent and Normal Rate  Volume:  Normal  Mood:  "Terribly sad."  Affect:  Appropriate and Congruent  Thought Process:  Coherent, Logical and Loose  Orientation:  Full  Thought Content:  WDL  Suicidal Thoughts:  No  Homicidal Thoughts:  No  Judgement:  Fair  Insight:  Fair  Psychomotor Activity:  Normal  Akathisia:  No  Handed:  Right  Memory: Immediate 3/3, recent 3/3   Concentration: Fair  AIMS (if indicated): Not Indicated  Assets:  Architect Housing Social Support    Assessment:    AXIS I Major Depressive Disorder  AXIS II No diagnosis  AXIS III Past Medical History  Diagnosis Date  .  Depression   . Diverticulitis   . Hypertension   . Renal disorder   . Renal calculi      AXIS IV problems with primary support group and Marital stressor  AXIS V 51-60 moderate symptoms   Treatment Plan/Recommendations:  PLAN:  1. Affirm with the patient that the medications are taken as ordered. Patient  expressed understanding of how their medications were to be used.  2. Continue the following psychiatric medications as written prior to this appointment/ with the following changes:  a) Increase Sertraline 100 mg daly B) Continue Clonazepam 0.5 mg Twice daily. C) Continue Trazodone-Take one-half (50 mg) one tablet daily ( 100 mg) at bedtime, patient takes a half tablet on most days.  3. Therapy: brief supportive therapy provided. Continue current services.  4. Risks and benefits, side effects and alternatives  discussed with patient, he/she was given an opportunity to  ask questions about his/her medication, illness, and treatment. All current psychiatric  medications have been reviewed and discussed with the patient and adjusted as clinically appropriate. The patient has been provided an accurate and updated list of the medications being now prescribed.  5. Patient told to call clinic if any problems occur. Patient advised to go to  ER  if s/he should develop SI/HI, side effects, or if symptoms worsen. Has crisis numbers to call if needed.   6. No labs warranted at this time. Will order fasting Blood glucose, HbA1c, and fasting Lipid profile for next visit. Will order EKG, depakote level, lithium level, tegretol level, cbc. Cmp, bmp. Liver function tests.  7. The patient was encouraged to keep all PCP and specialty clinic appointments.  8. Patient was instructed to return to clinic in  3 weeks.  9. The patient was advised to call and cancel their mental health appointment  within 24 hours of the appointment, if they are unable to keep the appointment.  10. The patient expressed understanding the plan an agrees with the above.    Jacqulyn Cane, MD 6/7/20131:06 PM

## 2012-03-05 ENCOUNTER — Ambulatory Visit (INDEPENDENT_AMBULATORY_CARE_PROVIDER_SITE_OTHER): Payer: Commercial Indemnity | Admitting: Licensed Clinical Social Worker

## 2012-03-05 DIAGNOSIS — F329 Major depressive disorder, single episode, unspecified: Secondary | ICD-10-CM
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
## 2012-03-07 ENCOUNTER — Encounter: Payer: Self-pay | Admitting: Family Medicine

## 2012-03-07 ENCOUNTER — Ambulatory Visit (INDEPENDENT_AMBULATORY_CARE_PROVIDER_SITE_OTHER): Payer: Commercial Indemnity | Admitting: Family Medicine

## 2012-03-07 VITALS — BP 132/79 | HR 83 | Ht 66.0 in | Wt 175.0 lb

## 2012-03-07 DIAGNOSIS — K5792 Diverticulitis of intestine, part unspecified, without perforation or abscess without bleeding: Secondary | ICD-10-CM

## 2012-03-07 DIAGNOSIS — F3289 Other specified depressive episodes: Secondary | ICD-10-CM

## 2012-03-07 DIAGNOSIS — K5732 Diverticulitis of large intestine without perforation or abscess without bleeding: Secondary | ICD-10-CM

## 2012-03-07 DIAGNOSIS — R109 Unspecified abdominal pain: Secondary | ICD-10-CM

## 2012-03-07 DIAGNOSIS — F329 Major depressive disorder, single episode, unspecified: Secondary | ICD-10-CM

## 2012-03-07 DIAGNOSIS — I10 Essential (primary) hypertension: Secondary | ICD-10-CM

## 2012-03-07 DIAGNOSIS — A048 Other specified bacterial intestinal infections: Secondary | ICD-10-CM

## 2012-03-07 MED ORDER — SUCRALFATE 1 G PO TABS: ORAL_TABLET | ORAL | Status: DC

## 2012-03-07 MED ORDER — LANSOPRAZOLE 30 MG PO CPDR: 30.0000 mg | DELAYED_RELEASE_CAPSULE | Freq: Every day | ORAL | Status: DC
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
## 2012-03-07 NOTE — Patient Instructions (Signed)
Diverticulitis Small pockets or "bubbles" can develop in the wall of the intestine. Diverticulitis is when those pockets become infected and inflamed. This causes stomach pain (usually on the left side). HOME CARE  Take all medicine as told by your doctor.   Try a clear liquid diet (broth, tea, or water) for as long as told by your doctor.   Keep all follow-up visits with your doctor.   You may be put on a low-fiber diet once you start feeling better. Here are foods that have low-fiber:   White breads, cereals, rice, and pasta.   Cooked fruits and vegetables or soft fresh fruits and vegetables without the skin.   Ground or well-cooked tender beef, ham, veal, lamb, pork, or poultry.   Eggs and seafood.   After you are doing well on the low-fiber diet, you may be put on a high-fiber diet. Here are ways to increase your fiber:   Choose whole-grain breads, cereals, pasta, and brown rice.   Choose fruits and vegetables with skin on. Do not overcook the vegetables.   Choose nuts, seeds, legumes, dried peas, beans, and lentils.   Look for food products that have more than 3 grams of fiber per serving on the food label.  GET HELP RIGHT AWAY IF:  Your pain does not get better or gets worse.   You have trouble eating food.   You are not pooping (having bowel movements) like normal.   You have a temperature by mouth above 102 F (38.9 C), not controlled by medicine.   You keep throwing up (vomiting).   You have bloody or black, tarry poop (stools).   You are getting worse and not better.  MAKE SURE YOU:   Understand these instructions.   Will watch your condition.   Will get help right away if you are not doing well or get worse.  Document Released: 02/28/2008 Document Revised: 08/31/2011 Document Reviewed: 08/02/2009 [facility] Patient Information 2012 [place], [place].Helicobacter Pylori and Ulcer Disease An ulcer may be in your stomach (gastric ulcer) or in the first part  of your small bowel, which is called the duodenum (duodenal ulcer). An ulcer is a break in the stomach or duodenum lining. The break wears down into the deeper tissue. Helicobacter pylori (H. pylori) is a type of germ (bacteria) that may cause the majority of gastric or duodenal ulcers. CAUSES   A germ (bacterium). H. pylori can weaken the protective mucous coating of the stomach and duodenum. This allows acid to get through to the sensitive lining of the stomach or duodenum and an ulcer can then form.   Certain medications.   Using substances that can bother the lining of the stomach (alcohol, tobacco or medications such as Advil or Motrin) in the presence of H.pylori infection. This can increase the chances of getting an ulcer.   Cancer (rarely).  Most people infected with H. pylori do not get ulcers. It is not known how people catch H. pylori. It may be through food or water. H. pylori has been found in the saliva of some infected people. Therefore, the bacteria may also spread through mouth-to-mouth contact such as kissing. SYMPTOMS  The problems (symptoms) of ulcer disease are usually:  A burning or gnawing of the mid-upper belly (abdomen). This is often worse on an empty stomach. It may get better with food. This may be associated with feeling sick to your stomach (nausea), bloating and vomiting.   If the ulcer results in bleeding, it can cause:  Black, tarry stools.   Throwing up bright red blood.   Throwing up coffee ground looking materials.  With severe bleeding, there may be loss of consciousness and shock. Besides ulcer disease, H. pylori can also cause chronic gastritis (irritation of the lining of the stomach without ulcer) or stomach acid-type discomfort. You may not have symptoms even though you have an H. pylori infection. Although this is an infection, you may not have usual infection symptoms (such as fever). DIAGNOSIS  Ulcer disease can be diagnosed in many different  ways. If you have an ulcer, it is important to know whether or not it is caused by H. Pylori. Treatment for an ulcer caused by H. pylori is different from that for an ulcer with other causes. The best way to detect H. pylori is taking tissue directly from the ulcer during an endoscopy test.   An endoscopy is an exam that uses an endoscope. This is a thin, lighted tube with a small camera on the end. It is like a flexible telescope. The patient is given a drug to make them calm (sedative). The caregiver eases the endoscope into the mouth and down the throat to the stomach and duodenum. This allows the doctor to see the lining of the esophagus, stomach and duodenum.   If an endoscopy is not needed, then H. pylori can be detected with tests of the blood, stool or even breath.  TREATMENT   H. pylori peptic ulcer treatment usually involves a combination of:   Medicines that kill germs (antibiotics).   Acid suppressors.   Stomach protectors.   The use of only one medication to treat H. pylori is not recommended. The most proven treatment is a 2 week course of treatment called triple therapy. It involves taking two antibiotics to kill the bacteria and either an acid suppressor or stomach-lining shield. Two-week triple therapy reduces ulcer symptoms, kills the bacteria, and prevents ulcers from coming back in many patients.   Unfortunately, patients may find triple therapy hard to do. This is because it involves taking as many as 20 pills a day. Also, the antibiotics used in triple therapy may cause mild side effects. These include nausea, vomiting, diarrhea, dark stools, a metallic taste in the mouth, dizziness, headache and yeast infections in women. Talk to your caregiver if you have any of these side effects.  HOME CARE INSTRUCTIONS   Take your medications as directed and for as long as prescribed. Contact your caregiver if you have problems or side effects from your medications.   Continue regular  work and usual activities unless told otherwise by your caregiver.   Avoid tobacco, alcohol and caffeine. Tobacco use will decrease and slow healing.   Avoid medications that are harmful. This includes aspirin and NSAIDS such as ibuprofen and naproxen.   Avoid foods that seem to aggravate or cause discomfort.   There are many over-the-counter products available to control stomach acid and other symptoms. Discuss these with your caregiver before using them. Do not  stop taking prescription medications for over-the-counter medications without talking with your caregiver.   Special diets are not usually needed.   Keep any follow-up appointments and blood tests as directed.  SEEK MEDICAL CARE IF:   Your pain or other ulcer symptoms do not improve within a few days of starting treatment.   You develop diarrhea. This can be a problem related to certain treatments.   You have ongoing indigestion or heartburn even if your main ulcer symptoms are  improved.   You think you have any side effects from your medications or if you do not understand how to use your medications right.  SEEK IMMEDIATE MEDICAL CARE IF:  Any of the following happen:  You develop bright red, rectal bleeding.   You develop dark black, tarry stools.   You throw up (vomit) blood.   You become light-headed, weak, have fainting episodes, or become sweaty, cold and clammy.   You have severe abdominal pain not controlled by medications. Do not take pain medications unless ordered by your caregiver.  MAKE SURE YOU:   Understand these instructions.   Will watch your condition.   Will get help right away if you are not doing well or get worse.  Document Released: 12/02/2003 Document Revised: 08/31/2011 Document Reviewed: 04/30/2008 [facility] Patient Information 2012 [place], [place].Helicobacter Pylori Disease Often patients with stomach or duodenal ulcers not caused by irritants, are infected with a germ. The germ is  called helicobacter pylori (H. pylori). This bacterium lives on the surface of stomach and small bowel. It can cause redness, soreness and ulcers. Ulcers are a hole in the lining of your stomach or small bowel. Blood and special breath tests can detect if you are infected with H. pylori. Tests can be done on samples taken from the stomach if you have endoscopy. After treatment you may have tests to prove you are cured. These can be done about a month after you finish the treatment or as your caregiver suggests. Most infections can be cured with a combination of antibiotics. Antibiotics are medications which kill germs such as H. pylori. Anti-ulcer medicines which block stomach acid secretion may also be used. Treatment will be continued for the time your caregiver suggests. Call your caregiver if you need more information about H. pylori. Call also if your symptoms get worse during or after treatment. You will not need a special diet. Avoid:  Smoking.   Aspirin.   Ibuprofen.   Other anti-inflammatory drugs.  Alcohol and spicy foods may also make your symptoms worse. The best advice is to avoid anything you find upsetting to your stomach. SEEK IMMEDIATE MEDICAL CARE IF:  You develop sharp, sudden, lasting stomach pain.   You have bloody vomit or vomit that looks like coffee grounds.   You have bloody or black stools.   You develop a lightheaded feeling, fainting, or become weak and sweaty.  Document Released: 09/11/2005 Document Revised: 08/31/2011 Document Reviewed: 02/27/2007 [facility] Patient Information 2012 [place], [place].

## 2012-03-13 ENCOUNTER — Ambulatory Visit (INDEPENDENT_AMBULATORY_CARE_PROVIDER_SITE_OTHER): Payer: Commercial Indemnity | Admitting: Licensed Clinical Social Worker

## 2012-03-13 DIAGNOSIS — F329 Major depressive disorder, single episode, unspecified: Secondary | ICD-10-CM

## 2012-03-15 NOTE — Progress Notes (Signed)
   THERAPIST PROGRESS NOTE  Session Time: 2:00 - 3:00  Participation Level: Active  Behavioral Response: CasualAlertDepressed  Type of Therapy: Individual Therapy  Treatment Goals addressed: Anger and Anxiety  Interventions: Motivational Interviewing and Supportive  Summary: Nathan [name] is a 61 y.o. male who presents with depression. He is struggling with his feelings about not having his son - he goes to court soon and his lawyer feels there will be no problem for him.  He has been getting support with his brother ans sister. He is thankful that they are there for him.  He is clear that the 70 B is based on the past and there is not anything current.  There is really nothing that he can define as a problem in the marriage.  He still insists it has to do with outside interference with her daughters.  He would like her to tell him what the problems are for her.  He feels any problems that they have had are worked out ok.  He is expressing a lot of sadness.   Suicidal/Homicidal: Nowithout intent/plan  Plan: Return again in 2 weeks.  Diagnosis: Axis I: Depressive Disorder NOS    Axis II: Deferred    Jru Pense,JUDITH A, LCSW 03/15/2012

## 2012-03-21 ENCOUNTER — Ambulatory Visit: Payer: Self-pay | Admitting: Family Medicine

## 2012-03-22 ENCOUNTER — Ambulatory Visit (HOSPITAL_COMMUNITY): Payer: Self-pay | Admitting: Psychiatry

## 2012-03-27 ENCOUNTER — Ambulatory Visit (INDEPENDENT_AMBULATORY_CARE_PROVIDER_SITE_OTHER): Payer: Commercial Indemnity | Admitting: Licensed Clinical Social Worker

## 2012-03-27 DIAGNOSIS — F339 Major depressive disorder, recurrent, unspecified: Secondary | ICD-10-CM

## 2012-03-27 NOTE — Progress Notes (Signed)
   1:00THERAPIST PROGRESS NOTE  Session Time: 1:15 - 2:00  Participation Level: Active  Behavioral Response: CasualAlertEuthymic  Type of Therapy: Individual Therapy  Treatment Goals addressed: Coping  Interventions: Motivational Interviewing and Supportive  Summary: Nathan [name] is a 61 y.o. male who presents with problems with depression related to work and relationship with wife. [name] had his son [name] with him but he waited in the waiting room.  He reports that the 50B was dropped and the custody is shared - she has him on weekends and he has him during the week for the school year and the summer is a little more flexible.  He is taking him to [employer] so he will have him longer for that.   He is happy [name] is back but not pleased that he will have to go back and forth. He states that [place] does better with structure.  He feels that Bufford Lope is a workaholic and does not attend to [name]' emotional needs - communication mainly through e-mail.  [name] used to play with kids in neighborhood  - now he does not want to.  He wants his mother to come home.   He could not continue in [employer] class because he had a stomach ache.. He has been waking up crying and slept with [facility] for a couple of nights. When he wakes up at night he is crying and shaking. He will be seeing Tasia Catchings and father will talk with him. He and Bufford Lope have to go to mediation meetings to work on separation agreement.  He plans to talk with her about reconciling.  He believes her daughter [name] has moved in with her and she has no job or child support.   Discussed with him that it was important to not put [facility] in the middle and to not talk about his mother in front of him unless it is positive.  They both need to sit down with him when they have decided what the final decision is about their marriage and discuss with him.[name] He needs to listen to his wife and hear how she has been unhappy in the marriage.  He has this  view that it is her daughters causing the problem which may keep him from hearing his wife's concerns.  He is assuming there is no problem in the marriage.  Suicidal/Homicidal: Nowithout intent/plan  Plan: Return again in 2 weeks.  Diagnosis: Axis I: Depressive Disorder NOS    Axis II: Deferred    Kazuma Elena,JUDITH A, LCSW 03/27/2012

## 2012-04-03 ENCOUNTER — Ambulatory Visit (INDEPENDENT_AMBULATORY_CARE_PROVIDER_SITE_OTHER): Payer: Commercial Indemnity | Admitting: Psychiatry

## 2012-04-03 ENCOUNTER — Encounter (HOSPITAL_COMMUNITY): Payer: Self-pay | Admitting: Psychiatry

## 2012-04-03 VITALS — BP 120/74 | HR 65 | Ht 66.0 in | Wt 175.0 lb

## 2012-04-03 DIAGNOSIS — F339 Major depressive disorder, recurrent, unspecified: Secondary | ICD-10-CM

## 2012-04-03 DIAGNOSIS — F329 Major depressive disorder, single episode, unspecified: Secondary | ICD-10-CM

## 2012-04-03 MED ORDER — SERTRALINE HCL 50 MG PO TABS: 125.0000 mg | ORAL_TABLET | Freq: Every day | ORAL | Status: DC

## 2012-04-03 MED ORDER — TRAZODONE HCL 100 MG PO TABS: ORAL_TABLET | ORAL | Status: DC

## 2012-04-03 NOTE — Progress Notes (Signed)
[facility] Behavioral Health Follow-up Outpatient Visit  [facility] 1951-04-30  Date:  Psychiatric Assessment Adult  Patient Identification: Nathan [name]  Date of Evaluation: 03/01/2012  Chief Complaint:  Chief Complaint   Patient presents with   .  Depression    History of Chief Complaint:  HPI Comments: Nathan [name] is a 61 y/o male with a past psychiatric history significant for Major Depressive Disorder. The patient is referred for psychiatric services for medication management. The patient reports that he and his wife have a temporary agreement in which he will have his child during the week. He reports that he would like to make his marriage work for the sake of his child.  In the area of affective symptoms, patient appears mildly depressed. Patient denies current suicidal ideation, intent, or plan. Patient denies current homicidal ideation, intent, or plan. Patient denies auditory hallucinations. Patient denies visual hallucinations. Patient denies symptoms of paranoia. Patient states sleep is improved, with approximately 6 hours of sleep per night with clonazepam and trazodone. Appetite is poor. Energy level is down. Patient endorses some symptoms of anhedonia. Patient denies any further hopelessness or guilt, he endorses continued helplessness at his current situation.   Denies any recent episodes const with mania, particularly decreased need for sleep with increased energy, grandiosity, impulsivity, hyperverbal and pressured speech, or increased productivity. Denies any recent symptoms consistent with psychosis, particularly auditory or visual hallucinations, thought broadcasting/insertion/withdrawal, or ideas of reference. Also denies excessive worry to the point of physical symptoms as well as any panic attacks. Denies any history of trauma or symptoms consistent with PTSD such as flashbacks, nightmares, hypervigilance, feelings of numbness or inability to connect with others.    Review of Systems  Respiratory: Negative.  Cardiovascular: Negative.  Gastrointestinal: Negative for nausea, vomiting, abdominal pain, diarrhea, constipation, blood in stool, abdominal distention, anal bleeding and rectal pain.  Hematological: Negative.   Filed Vitals:   04/03/12 1137  BP: 120/74  Pulse: 65  Height: 5\' 6"  (1.676 m)  Weight: 175 lb (79.379 kg)   Physical Exam  Vitals reviewed.  Constitutional: He appears well-developed and well-nourished. No distress.  Skin: He is not diaphoretic.   Traumatic Brain Injury: No  Past Psychiatric History:  Diagnosis: Major Depression Disorder.   Hospitalizations: Patient denies.   Outpatient Care: Patient denies.   Substance Abuse Care: Patient denies.   Self-Mutilation:Patient denies.   Suicidal Attempts: Patient denies.   Violent Behaviors: *Patient denies.    Past Medical History: Reviewed Diagnosis   .  Depression   .  Diverticulitis   .  Hypertension   .  Renal disorder   .  Renal calculi   .  Ligament tear of upper extremity        .  Diverticulitis   .  CRPS (complex regional pain syndrome), upper limb   History of Loss of Consciousness: Yes-Had a panic attack in 1988  Seizure History: No  Cardiac History: No   Allergies: Reviewed Allergies   Allergen  Reactions   .  Hydrocodone-Acetaminophen  Itching    Current Medications: Reviewed Current Outpatient Prescriptions on File Prior to Visit  Medication Sig Dispense Refill  . clonazePAM (KLONOPIN) 0.5 MG tablet Take 1 tablet (0.5 mg total) by mouth 2 (two) times daily as needed.  30 tablet  2  . colesevelam (WELCHOL) 625 MG tablet Take 3 tablets (1,875 mg total) by mouth 2 (two) times daily with a meal.  180 tablet  11  . levETIRAcetam (KEPPRA) 250  MG tablet Take 500 mg by mouth every 12 (twelve) hours.      [name] LUMIGAN 0.01 % SOLN       . metroNIDAZOLE (FLAGYL) 500 MG tablet Take one tab by mouth every 6 hours  21 tablet  0  . naproxen (NAPROSYN) 250 MG  tablet Take 250 mg by mouth 2 (two) times daily with a meal.      . sertraline (ZOLOFT) 100 MG tablet Take 1 tablet (100 mg total) by mouth daily. Pt's Zoloft was decreased in August.  30 tablet  1  . sucralfate (CARAFATE) 1 G tablet 2 tablets twice a day at least 30 minutes before eating  120 tablet  6  . telmisartan-hydrochlorothiazide (MICARDIS HCT) 80-25 MG per tablet Take 1 tablet by mouth daily.  30 tablet  2  . traZODone (DESYREL) 100 MG tablet Take one-half (50 mg) one tablet daily ( 100 mg) at bedtime  30 tablet  2  . lansoprazole (PREVACID) 30 MG capsule Take 1 capsule (30 mg total) by mouth daily.  30 capsule  2  . lansoprazole (PREVACID) 30 MG capsule Take 1 capsule (30 mg total) by mouth daily.  30 capsule  6   Previous Psychotropic Medications: Reviewed Medication  Dose   Sertraline  50-100 mg   Clonazepam  0.25 to 1 mg    Substance Abuse History in the last 12 months:  Caffeine: Coffee 0-1 cups per day. Caffeinated Beverages 8-16 ounces per day.  Nicotine:Patient denies.  Alcohol: Patient denies.  Illicit Drugs: Patient denies.   Social History: Reviewed Current Place of Residence: [place], [place]  Place of Birth:[place]  Family Members: Patient lives by himself, he has four children 65 (F), 23 (M), 47 (F), and 8 (M)  Marital Status: Separated  Children: 4  Sons: 53 (M) and 8 (M)  Daughters: 68 (F), 38 (F)  Relationships: Has a close relationship  Education: Financial planner Problems/Performance: Did well in school  Religious Beliefs/Practices: Prays.  History of Abuse: none  Occupational Experiences: Office manager History: None.  Legal History: Currently  Hobbies/Interests: Watching sports, painter   Family History: Reviewed Family History   Problem  Relation  Age of Onset   .  Cancer  Father     Mental Status Examination/Evaluation:  Objective: Appearance: Casual and Well Groomed   Eye Contact:: Good   Speech: Clear and Coherent and  Normal Rate   Volume: Normal   Mood: "Calm"   Affect: Appropriate and Congruent   Thought Process: Coherent, Logical and Loose   Orientation: Full   Thought Content: WDL   Suicidal Thoughts: No   Homicidal Thoughts: No   Judgement: Fair   Insight: Fair   Psychomotor Activity: Normal   Akathisia: No   Handed: Right   Memory: Immediate 3/3, recent 3/3   Concentration: Good  AIMS (if indicated): Not Indicated   Assets: Merchant navy officer  Housing  Social Support    Assessment:  AXIS I  Major Depressive Disorder   AXIS II  No diagnosis   AXIS III  Past Medical History    Diagnosis  Date    .  Depression     .  Diverticulitis     .  Hypertension     .  Renal disorder     .  Renal calculi       AXIS IV  problems with primary support group and Marital stressor   AXIS V  51-60 moderate symptoms  Treatment Plan/Recommendations:   PLAN:  1. Affirm with the patient that the medications are taken as ordered. Patient expressed understanding of how their medications were to be used.  2. Continue the following psychiatric medications as written prior to this appointment/ with the following changes:  a) Increase Sertraline 125 mg daly due to reported depressive symptoms. B) Continue Clonazepam 0.5 mg Twice daily.  C) Continue Trazodone-Take one-half (50 mg) to one tablet daily ( 100 mg) at bedtime, patient takes a half tablet on most days.  3. Therapy: brief supportive therapy provided. Continue current services.  4. Risks and benefits, side effects and alternatives discussed with patient, he/she was given an opportunity to ask questions about his medication, illness, and treatment. All current psychiatric medications have been reviewed and discussed with the patient and adjusted as clinically appropriate. The patient has been provided an accurate and updated list of the medications being now prescribed.  5. Patient told to call clinic if any problems  occur. Patient advised to go to ER if s/he should develop SI/HI, side effects, or if symptoms worsen. Has crisis numbers to call if needed.  6. No labs warranted at this time. 7. The patient was encouraged to keep all PCP and specialty clinic appointments.  8. Patient was instructed to return to clinic in 4 weeks.  9. The patient was advised to call and cancel their mental health appointment within 24 hours of the appointment, if they are unable to keep the appointment.  10. The patient expressed understanding the plan an agrees with the above.   Jacqulyn Cane, MD

## 2012-04-06 ENCOUNTER — Emergency Department
Admission: EM | Admit: 2012-04-06 | Discharge: 2012-04-06 | Disposition: A | Discharge status: Home / Self Care | Payer: Commercial Indemnity | Source: Home / Self Care

## 2012-04-06 ENCOUNTER — Encounter: Payer: Self-pay | Admitting: *Deleted

## 2012-04-06 DIAGNOSIS — K5732 Diverticulitis of large intestine without perforation or abscess without bleeding: Secondary | ICD-10-CM

## 2012-04-06 DIAGNOSIS — K5792 Diverticulitis of intestine, part unspecified, without perforation or abscess without bleeding: Secondary | ICD-10-CM

## 2012-04-06 MED ORDER — CIPROFLOXACIN HCL 500 MG PO TABS: 500.0000 mg | ORAL_TABLET | Freq: Two times a day (BID) | ORAL | Status: DC

## 2012-04-06 MED ORDER — METRONIDAZOLE 500 MG PO TABS: ORAL_TABLET | ORAL | Status: AC

## 2012-04-06 NOTE — ED Provider Notes (Signed)
History     CSN: [id number]  Arrival date & time 04/06/12  1358   First MD Initiated Contact with Patient 04/06/12 1404      Chief Complaint  Patient presents with  . Abdominal Pain  . Diarrhea   HPI Comments: Pt with a baseline hx/o diverticulitis Pt has been treated twice for diverticulitis flares over the last 3 months.  Pts that he has had a recurrence in sxs over the last 3 days.  Pt denies eating any trigger foods including seeds and nuts though pt states that he ate some chinese food which could have possibly triggered this flare.  No fevers, chills, or vomiting.  Mild nausea.  This has been a relatively mild flare.  Pt with a baseline hx/o GI ulcers in the past. Is on PPI and carafate. Has taken some NSAIDs for pain. Pt also recently treated here. Was placed on cipro and flagyl.  Pt states that this was effective in relieving sxs.    Patient is a 61 y.o. male presenting with abdominal pain and diarrhea.  Abdominal Pain The primary symptoms of the illness include abdominal pain and diarrhea. The primary symptoms of the illness do not include fever.  Diarrhea The primary symptoms include abdominal pain and diarrhea. Primary symptoms do not include fever.    Past Medical History  Diagnosis Date  . Depression   . Diverticulitis   . Hypertension   . Renal disorder   . Renal calculi   . Ligament tear of upper extremity April 2008    Left elbow  . Diverticulitis 2013  . CRPS (complex regional pain syndrome), upper limb     Past Surgical History  Procedure Date  . Rotary cuff repair   . Ulnar nerve repair     Family History  Problem Relation Age of Onset  . Cancer Father   . Lung cancer Father     History  Substance Use Topics  . Smoking status: Former Smoker -- 1.0 packs/day for 10 years    Types: Cigarettes    Quit date: 09/25/1981  . Smokeless tobacco: Never Used  . Alcohol Use: 0.0 oz/week    0 drink(s) per week     Last drink, two weeks ago       Review of Systems  Constitutional: Negative for fever.  Gastrointestinal: Positive for abdominal pain and diarrhea.  All other systems reviewed and are negative.    Allergies  Hydrocodone-acetaminophen  Home Medications   Current Outpatient Rx  Name Route Sig Dispense Refill  . CIPROFLOXACIN HCL 500 MG PO TABS Oral Take 1 tablet (500 mg total) by mouth 2 (two) times daily. 14 tablet 0  . CLONAZEPAM 0.5 MG PO TABS Oral Take 1 tablet (0.5 mg total) by mouth 2 (two) times daily as needed. 30 tablet 2  . COLESEVELAM HCL 625 MG PO TABS Oral Take 3 tablets (1,875 mg total) by mouth 2 (two) times daily with a meal. 180 tablet 11  . LANSOPRAZOLE 30 MG PO CPDR Oral Take 1 capsule (30 mg total) by mouth daily. 30 capsule 2  . LANSOPRAZOLE 30 MG PO CPDR Oral Take 1 capsule (30 mg total) by mouth daily. 30 capsule 6  . LEVETIRACETAM 250 MG PO TABS Oral Take 500 mg by mouth every 12 (twelve) hours.    [name] LUMIGAN 0.01 % OP SOLN      . METRONIDAZOLE 500 MG PO TABS  Take one tab by mouth every 6 hours 28 tablet 0  .  NAPROXEN 250 MG PO TABS Oral Take 250 mg by mouth 2 (two) times daily with a meal.    . SERTRALINE HCL 50 MG PO TABS Oral Take 2.5 tablets (125 mg total) by mouth daily. Pt's Zoloft was decreased in August. 30 tablet 1    Please disregard prescription for 50 mg sent today ...  . SUCRALFATE 1 G PO TABS  2 tablets twice a day at least 30 minutes before eating 120 tablet 6  . TELMISARTAN-HCTZ 80-25 MG PO TABS Oral Take 1 tablet by mouth daily. 30 tablet 2  . TRAZODONE HCL 100 MG PO TABS  Take one-half (50 mg) one tablet daily ( 100 mg) at bedtime 30 tablet 2    BP 122/79  Pulse 81  Temp 98.5 F (36.9 C) (Oral)  Resp 14  Ht 5\' 6"  (1.676 m)  Wt 174 lb (78.926 kg)  BMI 28.08 kg/m2  SpO2 95%  Physical Exam  Constitutional: He appears well-developed and well-nourished.  HENT:  Head: Normocephalic and atraumatic.  Eyes: Conjunctivae are normal. Pupils are equal, round, and  reactive to light.  Neck: Normal range of motion.  Cardiovascular: Normal rate and regular rhythm.   Pulmonary/Chest: Effort normal and breath sounds normal.  Abdominal: Bowel sounds are normal.       + [name] TTP, no guarding.   Musculoskeletal: Normal range of motion.  Neurological: He is alert.  Skin: Skin is warm. No rash noted.    ED Course  Procedures (including critical care time)  Labs Reviewed - No data to display No results found.   No diagnosis found.    MDM  Will treat with cipro and flagyl.  Will place on clear liquid diet.  Discussed trigger food avoidance.  Infectious and GI red falgs were iscussed.  Plan for follow up in 2 days for re-hceck.  Also broached issue of GI follow up in near future to better assess overall GI status.         Floydene Flock, MD 04/06/12 [phone]

## 2012-04-06 NOTE — ED Notes (Signed)
Pt has had lower abdominal pain and diarrhea for 2 days.  Pain described as cramping and mostly on L side.  Pt thinks it is diverticulitis because he had this 2 months ago

## 2012-04-08 NOTE — ED Provider Notes (Signed)
Agree with exam, assessment, and plan.   Lattie Haw, MD 04/08/12 1052

## 2012-04-11 ENCOUNTER — Ambulatory Visit: Payer: Self-pay | Admitting: Family Medicine

## 2012-04-11 ENCOUNTER — Encounter: Payer: Self-pay | Admitting: Family Medicine

## 2012-04-11 ENCOUNTER — Ambulatory Visit (INDEPENDENT_AMBULATORY_CARE_PROVIDER_SITE_OTHER): Payer: Commercial Indemnity | Admitting: Family Medicine

## 2012-04-11 VITALS — BP 119/83 | HR 100 | Ht 66.0 in | Wt 168.0 lb

## 2012-04-11 DIAGNOSIS — K5732 Diverticulitis of large intestine without perforation or abscess without bleeding: Secondary | ICD-10-CM

## 2012-04-11 DIAGNOSIS — K5792 Diverticulitis of intestine, part unspecified, without perforation or abscess without bleeding: Secondary | ICD-10-CM

## 2012-04-11 MED ORDER — PROMETHAZINE HCL 25 MG PO TABS: 25.0000 mg | ORAL_TABLET | Freq: Four times a day (QID) | ORAL | Status: DC | PRN

## 2012-04-11 NOTE — Progress Notes (Signed)
  Subjective:    Patient ID: Nathan [name], male    DOB: 03/08/1951, 61 y.o.   MRN: [id number]  HPI Seen at UC 5 days ago for possible diverticulis. Started on cipro and flagyl. Stopped both of then yesterday bc of nausea and dec appetite. Thus he had about a total of 1/2-5 days of Cipro and Flagyl. Abd pian is resolved but still having diarrhea.  No fever.   Still some mucous in the stool. Last episodes was 2 months ago, confirmed by CT of the abdomen. He has not seen a gastroenterologist yet. No fever today..  I reviewed the CT scan and notes from urgent care as well as the blood work.  He is going through a very hard time right now. His wife recently left him and took her son with them. He went a period of almost 3 weeks without seeing his son. His been depressed about this. He says it's been a struggle but hopefully will get better.  Review of Systems     Objective:   Physical Exam  Constitutional: He is oriented to person, place, and time. He appears well-developed and well-nourished.  HENT:  Head: Normocephalic and atraumatic.  Cardiovascular: Normal rate, regular rhythm and normal heart sounds.   Pulmonary/Chest: Effort normal and breath sounds normal.  Abdominal: Soft. Bowel sounds are normal. He exhibits no distension and no mass. There is no tenderness. There is no rebound and no guarding.  Neurological: He is alert and oriented to person, place, and time.  Skin: Skin is warm and dry.  Psychiatric: He has a normal mood and affect. His behavior is normal.          Assessment & Plan:  Acute diverticulitis-surprisingly he is actually not that tender on exam. I would like him to try to at least continue his Cipro. Discontinue the Flagyl as it is the most likely culprit of his GI upset and nausea. I gave him prescription for Phenergan to use as needed. Make sure staying hydrated. I will also refer him to gastroneurology for further evaluation workup especially if this is his  second episode in 2 months. If he suddenly gets worse or spikes a fever then he will need to go to the emergency department for IV antibiotics and rehydration. He does not appear to be dehydrated on exam today. I would like to get a CBC with differential today. We'll call him with the results.  Acute life stressors-encouraged him to please let me know if he is struggling to the point of having suicidal thoughts or problems. He says that he would let us know.

## 2012-04-11 NOTE — Patient Instructions (Addendum)
Stop the flagyl and try just the cipro with food and water If still not able to tolerate this then let me know.

## 2012-04-12 ENCOUNTER — Encounter: Payer: Self-pay | Admitting: Gastroenterology

## 2012-04-12 ENCOUNTER — Ambulatory Visit (HOSPITAL_COMMUNITY): Payer: Self-pay | Admitting: Licensed Clinical Social Worker

## 2012-04-12 ENCOUNTER — Other Ambulatory Visit: Payer: Self-pay | Admitting: *Deleted

## 2012-04-12 DIAGNOSIS — F339 Major depressive disorder, recurrent, unspecified: Secondary | ICD-10-CM | POA: Insufficient documentation

## 2012-04-12 LAB — COMPLETE METABOLIC PANEL WITH GFR
ALT: 21 U/L (ref 0–53)
AST: 27 U/L (ref 0–37)
Albumin: 4.7 g/dL (ref 3.5–5.2)
Alkaline Phosphatase: 92 U/L (ref 39–117)
BUN: 14 mg/dL (ref 6–23)
CO2: 26 mEq/L (ref 19–32)
Calcium: 11.1 mg/dL — ABNORMAL HIGH (ref 8.4–10.5)
Chloride: 103 mEq/L (ref 96–112)
Creat: 1.01 mg/dL (ref 0.50–1.35)
GFR, Est African American: >89 mL/min
GFR, Est Non African American: 80 mL/min
Glucose, Bld: 98 mg/dL (ref 70–99)
Potassium: 4.2 mEq/L (ref 3.5–5.3)
Sodium: 139 mEq/L (ref 135–145)
Total Bilirubin: 0.7 mg/dL (ref 0.3–1.2)
Total Protein: 7.7 g/dL (ref 6.0–8.3)

## 2012-04-12 LAB — CBC WITH DIFFERENTIAL/PLATELET
Basophils Absolute: 0 10*3/uL (ref 0.0–0.1)
Basophils Relative: 0 % (ref 0–1)
Eosinophils Absolute: 0.3 10*3/uL (ref 0.0–0.7)
Eosinophils Relative: 2 % (ref 0–5)
HCT: 49.6 % (ref 39.0–52.0)
Hemoglobin: 16.9 g/dL (ref 13.0–17.0)
Lymphocytes Relative: 21 % (ref 12–46)
Lymphs Abs: 2.4 10*3/uL (ref 0.7–4.0)
MCH: 29.2 pg (ref 26.0–34.0)
MCHC: 34.1 g/dL (ref 30.0–36.0)
MCV: 85.7 fL (ref 78.0–100.0)
Monocytes Absolute: 0.8 10*3/uL (ref 0.1–1.0)
Monocytes Relative: 7 % (ref 3–12)
Neutro Abs: 8 10*3/uL — ABNORMAL HIGH (ref 1.7–7.7)
Neutrophils Relative %: 70 % (ref 43–77)
Platelets: 297 10*3/uL (ref 150–400)
RBC: 5.79 MIL/uL (ref 4.22–5.81)
RDW: 15.4 % (ref 11.5–15.5)
WBC: 11.6 10*3/uL — ABNORMAL HIGH (ref 4.0–10.5)

## 2012-04-12 MED ORDER — CIPROFLOXACIN HCL 500 MG PO TABS: 500.0000 mg | ORAL_TABLET | Freq: Two times a day (BID) | ORAL | Status: AC

## 2012-04-14 MED ORDER — SERTRALINE HCL 50 MG PO TABS: 125.0000 mg | ORAL_TABLET | Freq: Every day | ORAL | Status: DC

## 2012-04-16 ENCOUNTER — Ambulatory Visit (HOSPITAL_COMMUNITY): Payer: Self-pay | Admitting: Psychiatry

## 2012-04-25 ENCOUNTER — Encounter: Payer: Self-pay | Admitting: Internal Medicine

## 2012-04-26 ENCOUNTER — Encounter: Payer: Self-pay | Admitting: Internal Medicine

## 2012-04-26 ENCOUNTER — Ambulatory Visit (INDEPENDENT_AMBULATORY_CARE_PROVIDER_SITE_OTHER): Payer: Commercial Indemnity | Admitting: Internal Medicine

## 2012-04-26 VITALS — BP 110/80 | HR 74 | Ht 66.0 in | Wt 169.0 lb

## 2012-04-26 DIAGNOSIS — K3189 Other diseases of stomach and duodenum: Secondary | ICD-10-CM

## 2012-04-26 DIAGNOSIS — K5792 Diverticulitis of intestine, part unspecified, without perforation or abscess without bleeding: Secondary | ICD-10-CM

## 2012-04-26 DIAGNOSIS — Z86010 Personal history of colonic polyps: Secondary | ICD-10-CM

## 2012-04-26 DIAGNOSIS — K5732 Diverticulitis of large intestine without perforation or abscess without bleeding: Secondary | ICD-10-CM

## 2012-04-26 DIAGNOSIS — R1013 Epigastric pain: Secondary | ICD-10-CM

## 2012-04-26 MED ORDER — PANTOPRAZOLE SODIUM 40 MG PO TBEC: 40.0000 mg | DELAYED_RELEASE_TABLET | Freq: Every day | ORAL | Status: DC

## 2012-04-26 MED ORDER — ALIGN PO CAPS: 1.0000 | ORAL_CAPSULE | Freq: Every day | ORAL | Status: DC

## 2012-04-26 NOTE — Patient Instructions (Addendum)
You have been given samples of align, and Nexium  Take Align; 1 Capsule daily Take Nexium; 1 capsule 30 minutes prior to first meal of the day.   Follow up with Dr. Rhea Belton in 1 month

## 2012-04-26 NOTE — Progress Notes (Signed)
Patient ID: Devansh Riese, male   DOB: 09-03-1951, 61 y.o.   MRN: [id number]  SUBJECTIVE: HPI Mr. Lamagna is a 61 yo male with PMH of hypertension, kidney stones, complex regional pain syndrome of the upper extremity, depression, and diverticulitis who seen in consultation at the request of Dr. Linford Arnold for evaluation of recent diverticulitis and left-sided abdominal pain. The patient reports 2 episodes of diverticulitis occurring in late May 2013 and then again in July 2013. This for him consists of significant left middle and left lower quadrant abdominal pain along with diarrhea. He reports that he was recently treated with ciprofloxacin and metronidazole, and completed antibiotics 6 days ago. The metronidazole was stopped early due to some nausea but he did complete the entire course of ciprofloxacin.  He reports 85% improvement in his bowel symptoms in his bowel habits returned to normal for him. He is no longer having diarrhea. His stools are now formed and brown without blood. He is still having occasional, intermittent, left-sided abdominal pain. There is no trigger to this pain and it is short lived. He does still have some mild nausea but no vomiting. No fevers, very rare chills and no night sweats. His appetite has been slightly decreased, but he also associates this with stress as he is recently had a breakup in his family.  He does occasionally have heartburn, but not on a daily basis. He does have some left upper quadrant abdominal pain which tends to relate more to eating and less to bowel movement. This is not the same pain that he reports with his diverticulitis  The patient does report a colonoscopy performed in [facility] in 2011.  Review of Systems  As per history of present illness, otherwise negative   Past Medical History  Diagnosis Date  . Depression   . Hypertension   . Renal disorder   . Renal calculi   . Ligament tear of upper extremity April 2008   Left elbow  . Diverticulitis 2013  . CRPS (complex regional pain syndrome), upper limb     Current Outpatient Prescriptions  Medication Sig Dispense Refill  . clonazePAM (KLONOPIN) 0.5 MG tablet Take 1 tablet (0.5 mg total) by mouth 2 (two) times daily as needed.  30 tablet  2  . levETIRAcetam (KEPPRA) 250 MG tablet Take 500 mg by mouth every 12 (twelve) hours.      [name] LUMIGAN 0.01 % SOLN       . naproxen (NAPROSYN) 250 MG tablet Take 250 mg by mouth 2 (two) times daily with a meal.      . sertraline (ZOLOFT) 50 MG tablet Take 2.5 tablets (125 mg total) by mouth daily. Pt's Zoloft was decreased in August.  75 tablet  1  . telmisartan-hydrochlorothiazide (MICARDIS HCT) 80-25 MG per tablet Take 1 tablet by mouth daily.  30 tablet  2  . traZODone (DESYREL) 100 MG tablet Take one-half (50 mg) one tablet daily ( 100 mg) at bedtime  30 tablet  2  . bifidobacterium infantis (ALIGN) capsule Take 1 capsule by mouth daily.  14 capsule  0  . pantoprazole (PROTONIX) 40 MG tablet Take 1 tablet (40 mg total) by mouth daily.  90 tablet  3    Allergies  Allergen Reactions  . Adhesive (Tape)     Hives on area wear tape applied  . Hydrocodone-Acetaminophen Itching    Family History  Problem Relation Age of Onset  . Lung cancer Father   . Kidney disease Mother   .  Colon cancer Neg Hx     History  Substance Use Topics  . Smoking status: Former Smoker -- 1.0 packs/day for 10 years    Types: Cigarettes    Quit date: 09/25/1981  . Smokeless tobacco: Never Used  . Alcohol Use: 0.0 oz/week    0 drink(s) per week     Last drink, two weeks ago    OBJECTIVE: BP 110/80  Pulse 74  Ht 5\' 6"  (1.676 m)  Wt 169 lb (76.658 kg)  BMI 27.28 kg/m2 Constitutional: Well-developed and well-nourished. No distress. HEENT: Normocephalic and atraumatic. Oropharynx is clear and moist. No oropharyngeal exudate. Conjunctivae are normal. Pupils are equal round and reactive to light. No scleral icterus. Neck: Neck  supple. Trachea midline. Cardiovascular: Normal rate, regular rhythm and intact distal pulses. No M/R/G Pulmonary/chest: Effort normal and breath sounds normal. No wheezing, rales or rhonchi. Abdominal: Soft, nontender, nondistended. Bowel sounds active throughout. There are no masses palpable. No hepatosplenomegaly. Extremities: no clubbing, cyanosis, or edema Lymphadenopathy: No cervical adenopathy noted. Neurological: Alert and oriented to person place and time. Skin: Skin is warm and dry. No rashes noted. Psychiatric: Normal mood and affect. Behavior is normal.  Labs and Imaging -- CBC    Component Value Date/Time   WBC 11.6* 04/11/2012 1650   RBC 5.79 04/11/2012 1650   HGB 16.9 04/11/2012 1650   HCT 49.6 04/11/2012 1650   PLT 297 04/11/2012 1650   MCV 85.7 04/11/2012 1650   MCH 29.2 04/11/2012 1650   MCHC 34.1 04/11/2012 1650   RDW 15.4 04/11/2012 1650   LYMPHSABS 2.4 04/11/2012 1650   MONOABS 0.8 04/11/2012 1650   EOSABS 0.3 04/11/2012 1650   BASOSABS 0.0 04/11/2012 1650    CMP     Component Value Date/Time   NA 139 04/11/2012 1651   K 4.2 04/11/2012 1651   CL 103 04/11/2012 1651   CO2 26 04/11/2012 1651   GLUCOSE 98 04/11/2012 1651   BUN 14 04/11/2012 1651   CREATININE 1.01 04/11/2012 1651   CREATININE 0.93 05/11/2010 0102   CALCIUM 11.1* 04/11/2012 1651   PROT 7.7 04/11/2012 1651   ALBUMIN 4.7 04/11/2012 1651   AST 27 04/11/2012 1651   ALT 21 04/11/2012 1651   ALKPHOS 92 04/11/2012 1651   BILITOT 0.7 04/11/2012 1651   GFRNONAA >60 06/24/2007   CT ABDOMEN AND PELVIS WITH CONTRAST -- 02/20/12   Technique:  Multidetector CT imaging of the abdomen and pelvis was performed following the standard protocol during bolus administration of intravenous contrast.   Contrast: 1 OMNIPAQUE IOHEXOL 300 MG/ML  SOLN, OMNIPAQUE IOHEXOL 300 MG/ML  SOLN   Comparison: CT abdomen and pelvis of 05/13/2010   Findings: The lung bases are clear.  The liver is inhomogeneous and low in attenuation  consistent with fatty infiltration.  Vessels do course through this region normally. No ductal dilatation is seen. No calcified gallstones are noted.  The pancreas is normal in size and the pancreatic duct is not dilated.  The adrenal glands and spleen are unremarkable.  The stomach is moderately fluid distended with no abnormality noted.  The kidneys enhance and only a tiny nonobstructing calculus is seen in the lower pole of the right collecting system.  A right renal cyst is stable in size.  On delayed images, the pelvocaliceal systems are unremarkable.  The abdominal aorta is normal in caliber.  No adenopathy is seen.   The urinary bladder is unremarkable.  The prostate is somewhat inhomogeneous with several foci of  enhancement noted. Multiple rectosigmoid colonic diverticula present.  In addition there is mild mucosal edema and pericolonic strandiness of the proximal sigmoid colon consistent with mild diverticulitis.  No complicating features are seen.  The terminal ileum and the appendix are unremarkable.  Degenerative changes are noted particularly at the L5-S1 level.   IMPRESSION:   1.  Mild diverticulitis of the proximal rectosigmoid colon.  No complicating features. 2.  Fatty infiltration of the liver primarily involving the right lobe. 3.  Small nonobstructing calculus in the lower pole of the right kidney.  Colonoscopy, performed by Dr. Mellody Memos, M.D., 11/01/2009 -- exam to cecum. Prep good.  One 3 mm cecal polyp resected and retrieved, one 5 mm ascending colon polyp resected and retrieved. Diverticulosis sigmoid and descending colon. (Pathology report not available)  ASSESSMENT AND PLAN:  61 yo male with PMH of hypertension, kidney stones, complex regional pain syndrome of the upper extremity, depression, and diverticulitis who seen in consultation at the request of Dr. Linford Arnold for evaluation of recent diverticulitis and left-sided abdominal pain.   1. Hx of  diverticulitis/abdominal pain  -- the patient had CT documented diverticulitis in late May 2013 and clinical findings consistent with this diagnosis both in late May and early July 2013. At this point it seems that he has resolved his infection and his abdominal exam is normal today. This is encouraging and I do not think he needs further antibiotics at this point. We have discussed how occasionally people need segmental colon resections for recurrent diverticulitis, but I do not think he is at this point now. If he continues to have recurrent infection, we can discuss this further. I've asked that he let us know immediately if his symptoms begin to recur, and for any subsequent attacks of diverticulitis in the near future, I would recommend changing antibiotics given his exposure to Cipro and Flagyl recently. My choice would likely be Augmentin twice a day.  He is up-to-date on his colorectal cancer screening, and therefore do not think colonoscopy needs to be repeated now.  2. Upper abdominal pain -- this may be more dyspeptic, and I will give a trial of PPI. Nexium 40 mg once daily.  3. History of colon polyps -- 2 polyps removed February 2011, these were in the right colon and presumed adenomatous. We will attempt to obtain path report.  Would recommend repeat colonoscopy February 2016  Follow up in one month

## 2012-05-01 ENCOUNTER — Ambulatory Visit (HOSPITAL_COMMUNITY): Payer: Self-pay | Admitting: Psychiatry

## 2012-05-10 ENCOUNTER — Ambulatory Visit (INDEPENDENT_AMBULATORY_CARE_PROVIDER_SITE_OTHER): Payer: Commercial Indemnity | Admitting: Licensed Clinical Social Worker

## 2012-05-10 DIAGNOSIS — F329 Major depressive disorder, single episode, unspecified: Secondary | ICD-10-CM

## 2012-05-12 NOTE — Progress Notes (Signed)
   THERAPIST PROGRESS NOTE  Session Time: 1:00 - 1:55  Participation Level: Active  Behavioral Response: CasualAlertDepressed and Euthymic  Type of Therapy: Individual Therapy  Treatment Goals addressed: Coping  Interventions: Motivational Interviewing and Reframing  Summary: Nathan [name] is a 61 y.o. male who presents with depression.  [name] reports that [name] tried to kill herself - she left a very scary not on text so that everyone thought the same thing.  He, her mother and daughter all called the police.  She was in hotel room say she was getting away from everything. [name],   She ended u in [facility] ER - she was angry that he came there and she asked to leave and he did .  He truly believes that there are no problems in their marriage that could not be worked out  He feels that she left because of her daughters and she is taking care of them.[name] He feels that she is feeling overwhelmed with meeting all of their financial needs for neither one is working.  She is denying that she was suicidal and is expressing anger at everyone for calling the police...  [name] seems depressed for he just looks unhappy and he is not playing with the children in the neighborhood.  Danyl has this quiet way of pursuing his believe - refuses to see that there are problems with the marriage.  He says that he is doing ok - sad about the situation.  There is not much to work with Para March because he is not taking any responsibility for end of marriage.  He says coming in once a month is fine.  If something come up earlier he will call.  Suicidal/Homicidal: Nowithout intent/plan  Plan: Return again in 4 weeks.  Diagnosis: Axis I: Major depressive disorder    Axis II: Deferred    Ariba Lehnen,JUDITH A, LCSW 05/12/2012

## 2012-05-13 ENCOUNTER — Ambulatory Visit: Payer: Self-pay | Admitting: Gastroenterology

## 2012-05-13 ENCOUNTER — Other Ambulatory Visit (HOSPITAL_COMMUNITY): Payer: Self-pay | Admitting: Psychiatry

## 2012-05-13 MED ORDER — CLONAZEPAM 0.5 MG PO TABS: 0.5000 mg | ORAL_TABLET | Freq: Two times a day (BID) | ORAL | Status: DC | PRN

## 2012-05-15 ENCOUNTER — Telehealth (HOSPITAL_COMMUNITY): Payer: Self-pay

## 2012-05-15 ENCOUNTER — Encounter (HOSPITAL_COMMUNITY): Payer: Self-pay | Admitting: Licensed Clinical Social Worker

## 2012-05-15 NOTE — Telephone Encounter (Signed)
Patient has been using Klonopin as needed. He has been waking up with panic attacks in the night. I have recommended that he take the Klonopin standing. I would like him to  alternate 7 AM and 7 PM. He may still take the trazodone when ready for bed.

## 2012-05-23 ENCOUNTER — Ambulatory Visit (HOSPITAL_COMMUNITY): Payer: Self-pay | Admitting: Psychiatry

## 2012-05-28 ENCOUNTER — Ambulatory Visit: Payer: Self-pay | Admitting: Family Medicine

## 2012-05-29 ENCOUNTER — Encounter: Payer: Self-pay | Admitting: Internal Medicine

## 2012-05-29 ENCOUNTER — Telehealth: Payer: Self-pay | Admitting: Internal Medicine

## 2012-05-29 NOTE — Telephone Encounter (Signed)
LVm for pt to call me back regarding nexium Rx.  I show he is taking Protonix

## 2012-05-30 ENCOUNTER — Ambulatory Visit: Payer: Self-pay | Admitting: Internal Medicine

## 2012-05-30 ENCOUNTER — Encounter: Payer: Self-pay | Admitting: Physician Assistant

## 2012-05-31 ENCOUNTER — Ambulatory Visit (HOSPITAL_COMMUNITY): Payer: Self-pay | Admitting: Psychiatry

## 2012-06-03 ENCOUNTER — Encounter (HOSPITAL_COMMUNITY): Payer: Self-pay | Admitting: Psychiatry

## 2012-06-03 ENCOUNTER — Ambulatory Visit: Payer: Self-pay | Admitting: Family Medicine

## 2012-06-03 ENCOUNTER — Ambulatory Visit (INDEPENDENT_AMBULATORY_CARE_PROVIDER_SITE_OTHER): Payer: Medicare Other | Admitting: Psychiatry

## 2012-06-03 VITALS — BP 112/81 | HR 68 | Ht 66.0 in | Wt 174.0 lb

## 2012-06-03 DIAGNOSIS — F329 Major depressive disorder, single episode, unspecified: Secondary | ICD-10-CM

## 2012-06-03 DIAGNOSIS — F339 Major depressive disorder, recurrent, unspecified: Secondary | ICD-10-CM

## 2012-06-03 MED ORDER — CLONAZEPAM 0.5 MG PO TABS: 0.5000 mg | ORAL_TABLET | Freq: Two times a day (BID) | ORAL | Status: DC | PRN

## 2012-06-03 MED ORDER — SERTRALINE HCL 100 MG PO TABS: 150.0000 mg | ORAL_TABLET | Freq: Every day | ORAL | Status: DC

## 2012-06-03 NOTE — Progress Notes (Addendum)
[facility] Behavioral Health Follow-up Outpatient Visit  Nathan [name] 14-Jun-1951  Date: 06/03/2012  Chief Complaint: Follow up  History of Chief Complaint:   HPI Comments: Nathan [name] is a 61 y/o male with a past psychiatric history significant for Major Depressive Disorder. The patient is referred for psychiatric services for medication management. The patient reports that he has been maintaining his social networks but has starting telling his friends and family that he does not want to talk about his family issues.  He reports that he has started going to church and attending continuing education classes at a local college.  He reports frustration over perceived lack of care of his wife towards their son. He is concerned about his son's emotional health given his wife's absence from their home but finds difficulty with her coming to their home while the patient at classes.  In the area of affective symptoms, patient appears mildly depressed. Patient denies current suicidal ideation, intent, or plan. Patient denies current homicidal ideation, intent, or plan. Patient denies auditory hallucinations. Patient denies visual hallucinations. Patient denies symptoms of paranoia. Patient states sleep is poor, with approximately 5 hours of sleep per night despite use of clonazepam and trazodone. Appetite is poor. Energy level is low. Patient endorses some symptoms of anhedonia. Patient denies any further hopelessness or guilt, he endorses continued helplessness at his current situation.   Denies any recent episodes const with mania, particularly decreased need for sleep with increased energy, grandiosity, impulsivity, hyperverbal and pressured speech, or increased productivity. Denies any recent symptoms consistent with psychosis, particularly auditory or visual hallucinations, thought broadcasting/insertion/withdrawal, or ideas of reference. Also denies excessive worry to the point of physical symptoms as  well as any panic attacks. Denies any history of trauma or symptoms consistent with PTSD such as flashbacks, nightmares, hypervigilance, feelings of numbness or inability to connect with others.   Review of Systems  Respiratory: Negative.  Cardiovascular: Negative.  Gastrointestinal: Negative for nausea, vomiting, abdominal pain, diarrhea, constipation, blood in stool, abdominal distention, anal bleeding and rectal pain.  Hematological: Negative.   Filed Vitals:   06/03/12 1143  BP: 112/81  Pulse: 68  Height: 5\' 6"  (1.676 m)  Weight: 174 lb (78.926 kg)   Physical Exam  Vitals reviewed.  Constitutional: He appears well-developed and well-nourished. No distress.  Skin: He is not diaphoretic.   Traumatic Brain Injury: No   Past Psychiatric History:  Diagnosis: Major Depression Disorder.   Hospitalizations: Patient denies.   Outpatient Care: Patient denies.   Substance Abuse Care: Patient denies.   Self-Mutilation:Patient denies.   Suicidal Attempts: Patient denies.   Violent Behaviors: *Patient denies.   ` Past Medical History: Reviewed  Diagnosis   .  Depression   .  Diverticulitis   .  Hypertension   .  Renal disorder   .  Renal calculi   .  Ligament tear of upper extremity        .  Diverticulitis   .  CRPS (complex regional pain syndrome), upper limb    History of Loss of Consciousness: Yes-Had a panic attack in 1988  Seizure History: No  Cardiac History: No   Allergies: Reviewed  Allergies   Allergen  Reactions   .  Hydrocodone-Acetaminophen  Itching    Current Medications: Reviewed  Current Outpatient Prescriptions on File Prior to Visit   Medication  Sig  Dispense  Refill   .  clonazePAM (KLONOPIN) 0.5 MG tablet  Take 1 tablet (0.5 mg total) by mouth  2 (two) times daily as needed.  30 tablet  2   .  colesevelam (WELCHOL) 625 MG tablet  Take 3 tablets (1,875 mg total) by mouth 2 (two) times daily with a meal.  180 tablet  11   .  levETIRAcetam (KEPPRA)  250 MG tablet  Take 500 mg by mouth every 12 (twelve) hours.     [name]  LUMIGAN 0.01 % SOLN      .  metroNIDAZOLE (FLAGYL) 500 MG tablet  Take one tab by mouth every 6 hours  21 tablet  0   .  naproxen (NAPROSYN) 250 MG tablet  Take 250 mg by mouth 2 (two) times daily with a meal.     .  sertraline (ZOLOFT) 100 MG tablet  Take 1 tablet (100 mg total) by mouth daily. Pt's Zoloft was decreased in August.  30 tablet  1   .  sucralfate (CARAFATE) 1 G tablet  2 tablets twice a day at least 30 minutes before eating  120 tablet  6   .  telmisartan-hydrochlorothiazide (MICARDIS HCT) 80-25 MG per tablet  Take 1 tablet by mouth daily.  30 tablet  2   .  traZODone (DESYREL) 100 MG tablet  Take one-half (50 mg) one tablet daily ( 100 mg) at bedtime  30 tablet  2   .  lansoprazole (PREVACID) 30 MG capsule  Take 1 capsule (30 mg total) by mouth daily.  30 capsule  2   .  lansoprazole (PREVACID) 30 MG capsule  Take 1 capsule (30 mg total) by mouth daily.  30 capsule  6    Previous Psychotropic Medications: Reviewed  Medication  Dose   Sertraline  50-100 mg   Clonazepam  0.25 to 1 mg    Substance Abuse History in the last 12 months:  Caffeine: Coffee 1 cups per day. Caffeinated Beverages 8-16 ounces per day.  Nicotine:Patient denies.  Alcohol: Patient denies.  Illicit Drugs: Patient denies.   Social History: Reviewed  Current Place of Residence: [place], [place]  Place of Birth:[place]  Family Members: Patient lives by himself, he has four children 49 (F), 66 (M), 2 (F), and 8 (M)  Marital Status: Separated  Children: 4  Sons: 69 (M) and 8 (M)  Daughters: 61 (F), 11 (F)  Relationships: Has a close relationship  Education: Financial planner Problems/Performance: Did well in school  Religious Beliefs/Practices: Prays.  History of Abuse: none  Occupational Experiences: Office manager History: None.  Legal History: Currently  Hobbies/Interests: Watching sports, painter   Family  History: Reviewed  Family History   Problem  Relation  Age of Onset   .  Cancer  Father     Mental Status Examination/Evaluation:  Objective: Appearance: Casual and Well Groomed   Eye Contact:: Good   Speech: Clear and Coherent and Normal Rate   Volume: Normal   Mood: "Sad, anxious"   Affect: Appropriate and Congruent   Thought Process: Coherent, Logical and Loose   Orientation: Full   Thought Content: WDL   Suicidal Thoughts: No   Homicidal Thoughts: No   Judgement: Fair   Insight: Fair   Psychomotor Activity: Normal   Akathisia: No   Handed: Right   Memory: Immediate 3/3, recent 3/3   Concentration: Good   AIMS (if indicated): Not Indicated   Assets: Merchant navy officer  Housing  Social Support    Assessment:  AXIS I  Major Depressive Disorder   AXIS II  No diagnosis  AXIS III  Past Medical History    Diagnosis  Date    .  Depression     .  Diverticulitis     .  Hypertension     .  Renal disorder     .  Renal calculi       AXIS IV  problems with primary support group and Marital stressor   AXIS V  51-60 moderate symptoms    Treatment Plan/Recommendations:  PLAN:  1. Affirm with the patient that the medications are taken as ordered. Patient expressed understanding of how their medications were to be used.  2. Continue the following psychiatric medications as written prior to this appointment/ with the following changes:  a) Increase Sertraline 125 mg daly due to reported depressive symptoms.  B) Decrease Clonazepam 0.5 mg one half to one whole tablet daily. C) Continue Trazodone-Take one-half (50 mg) to one tablet daily ( 100 mg) at bedtime, patient takes a half tablet on most days.  3. Therapy: brief supportive therapy provided. Continue current services.  4. Risks and benefits, side effects and alternatives discussed with patient, he/she was given an opportunity to ask questions about his medication, illness, and treatment. All  current psychiatric medications have been reviewed and discussed with the patient and adjusted as clinically appropriate. The patient has been provided an accurate and updated list of the medications being now prescribed.  5. Patient told to call clinic if any problems occur. Patient advised to go to ER if s/he should develop SI/HI, side effects, or if symptoms worsen. Has crisis numbers to call if needed.  6. No labs warranted at this time.  7. The patient was encouraged to keep all PCP and specialty clinic appointments.  8. Patient was instructed to return to clinic in 4 weeks.  9. The patient was advised to call and cancel their mental health appointment within 24 hours of the appointment, if they are unable to keep the appointment.  10. The patient expressed understanding the plan an agrees with the above.   Jacqulyn Cane, MD

## 2012-06-04 ENCOUNTER — Encounter: Payer: Self-pay | Admitting: Internal Medicine

## 2012-06-05 ENCOUNTER — Encounter: Payer: Self-pay | Admitting: Internal Medicine

## 2012-06-05 ENCOUNTER — Ambulatory Visit (INDEPENDENT_AMBULATORY_CARE_PROVIDER_SITE_OTHER): Payer: Medicare Other | Admitting: Internal Medicine

## 2012-06-05 VITALS — BP 110/74 | HR 70 | Ht 66.0 in | Wt 173.6 lb

## 2012-06-05 DIAGNOSIS — Z86010 Personal history of colonic polyps: Secondary | ICD-10-CM | POA: Diagnosis not present

## 2012-06-05 DIAGNOSIS — K219 Gastro-esophageal reflux disease without esophagitis: Secondary | ICD-10-CM | POA: Diagnosis not present

## 2012-06-05 DIAGNOSIS — Z8719 Personal history of other diseases of the digestive system: Secondary | ICD-10-CM | POA: Diagnosis not present

## 2012-06-05 DIAGNOSIS — K3189 Other diseases of stomach and duodenum: Secondary | ICD-10-CM

## 2012-06-05 DIAGNOSIS — R1013 Epigastric pain: Secondary | ICD-10-CM

## 2012-06-05 MED ORDER — ESOMEPRAZOLE MAGNESIUM 40 MG PO CPDR: 40.0000 mg | DELAYED_RELEASE_CAPSULE | Freq: Every day | ORAL | Status: DC

## 2012-06-05 NOTE — Patient Instructions (Addendum)
Continue taking Nexium 40 mg daily before first meal of the day.  Start taking Align daily, this can be purchase over the counter.  Follow up with Dr. Rhea Belton in 3 months

## 2012-06-05 NOTE — Progress Notes (Signed)
  Subjective:    Patient ID: Nathan [name], male    DOB: [birth year], 61 y.o.   MRN: [id number]  HPI Nathan [name] is a 61 yo with PMH of hypertension, kidney stones, complex regional pain syndrome of the upper extremity, depression, and diverticulitis who seen in followup. He was seen one month ago and returns today alone.  At last visit he was started on Nexium 40 mg daily and align one capsule daily. He reports a good response with both of these medications, and he has had resolution of his abdominal pain. He has run out of Nexium, and family Align to be a bit expensive. He's had no further abdominal pain. No nausea or vomiting. Once completing align he is noted a slight increase abdominal bloating but no pain. Bowel habits have been normal with formed brown stool. No blood or melena. No fevers or chills. Good appetite. No heartburn on Nexium, and actually rare heartburn once off for for the last 5 days.  Review of Systems As per history of present illness, otherwise negative  Current Medications, Allergies, Past Medical History, Past Surgical History, Family History and Social History were reviewed in [employer] record.     Objective:   Physical Exam BP 110/74  Pulse 70  Ht 5\' 6"  (1.676 m)  Wt 173 lb 9.6 oz (78.744 kg)  BMI 28.02 kg/m2  SpO2 96% Constitutional: Well-developed and well-nourished. No distress. HEENT: Normocephalic and atraumatic. Oropharynx is clear and moist. No oropharyngeal exudate. Conjunctivae are normal.  No scleral icterus. Neck: Neck supple. Trachea midline. Cardiovascular: Normal rate, regular rhythm and intact distal pulses. No M/R/G Pulmonary/chest: Effort normal and breath sounds normal. No wheezing, rales or rhonchi. Abdominal: Soft, nontender, nondistended. Bowel sounds active throughout. There are no masses palpable. No hepatosplenomegaly. Extremities: no clubbing, cyanosis, or edema Lymphadenopathy: No cervical adenopathy  noted. Neurological: Alert and oriented to person place and time. Skin: Skin is warm and dry. No rashes noted. Psychiatric: Normal mood and affect. Behavior is normal.     Assessment & Plan:  61 yo with PMH of hypertension, kidney stones, complex regional pain syndrome of the upper extremity, depression, and diverticulitis who seen in followup  1.  History of diverticulitis -- overall improved with no residual signs or symptoms. He is up-to-date on colorectal cancer screening. He will let us know if symptoms return..  2.  Dyspepsia -- he had a  favorable response to Nexium 40 mg daily. This seemed to help with his epigastric/dyspeptic abdominal pain and mild nausea. For now we will continue Nexium 40 mg daily. I would also like him to continue align one capsule daily.   3. CRC screening -- History of colon polyps -- 2 polyps removed February 2011, these were in the right colon and presumed adenomatous. We will attempt to obtain path report. Would recommend repeat colonoscopy February 2016   I will see him back in 3 months or sooner if necessary

## 2012-06-11 ENCOUNTER — Ambulatory Visit (INDEPENDENT_AMBULATORY_CARE_PROVIDER_SITE_OTHER): Payer: Commercial Indemnity | Admitting: Licensed Clinical Social Worker

## 2012-06-11 DIAGNOSIS — F339 Major depressive disorder, recurrent, unspecified: Secondary | ICD-10-CM

## 2012-06-11 NOTE — Progress Notes (Signed)
   THERAPIST PROGRESS NOTE  Session Time: 1:05 - 2:00  Participation Level: Active  Behavioral Response: CasualAlertDepressed  Type of Therapy: Individual Therapy  Treatment Goals addressed: Coping  Interventions: Motivational Interviewing and Supportive  Summary: [place] is a 61 y.o. male who presents with problems from wife leaving.  [place] reporting that Bufford Lope has been in and out of hospital for psychiatric problems.  She was hospitalized for about 10 days.  .She is out now and takes care of [name] on the nights he goes to school and every weekend.  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  Workmen's comp is paying for it and he likes it.  Bufford Lope has been coming to the house to take care of the [place].when he goes to school.   He is beginning to have mixed feeling about her staying at the house.  The regular babysitter and friend is right down the street - she is staring to stay with her over nights so that she does not have to drive home late at night. He is also re-thinking the weekends - he feels it should be every other weekend.  They do not have an agreement.  Concerned that he has been very open to doing what Bufford Lope wants but since she has not made any move to come back, he is rethinking everything.  He really does not understand why she has left - he wants to see it as problems with her daughters.  He would like to go to therapy with her but she does not seem willing.  I did give him a name of a couple therapist told him that it can be useful to help them separate and make an agreement with out a lot of conflict even if they are not able to work on their relationship. The process will hopefully help understand the problems in the marriage.  Suicidal/Homicidal: Nowithout intent/plan  Plan: Return again in 2 weeks.  Diagnosis: Axis I: Depressive Disorder NOS    Axis II: Deferred    Lamiracle Chaidez,JUDITH A, LCSW 06/11/2012

## 2012-06-20 DIAGNOSIS — M1710 Unilateral primary osteoarthritis, unspecified knee: Secondary | ICD-10-CM | POA: Diagnosis not present

## 2012-06-24 ENCOUNTER — Encounter: Payer: Self-pay | Admitting: Physician Assistant

## 2012-06-24 ENCOUNTER — Ambulatory Visit (INDEPENDENT_AMBULATORY_CARE_PROVIDER_SITE_OTHER): Payer: Medicare Other | Admitting: Physician Assistant

## 2012-06-24 DIAGNOSIS — R0789 Other chest pain: Secondary | ICD-10-CM

## 2012-06-24 NOTE — Procedures (Signed)
Nathan [name] is a 61 y.o. male with a hx of HTN (?), HL (?), prior tobacco use referred by PCP for ETT due to atypical CP.  No exertional symptoms, DOE, syncope.  No FHx CAD.  Exam unremarkable.  Baseline ECG normal.    Exercise Treadmill Test  Pre-Exercise Testing Evaluation Rhythm: normal sinus  Rate: 64   PR:  .17 QRS:  .10  QT:  .39 QTc: .41     Test  Exercise Tolerance Test Ordering MD: Hassan Rowan  Interpreting MD: Tereso Newcomer , PA-C  Unique Test No: 1  Treadmill:  1  Indication for ETT: chest pain - rule out ischemia  Contraindication to ETT: No   Stress Modality: exercise - treadmill  Cardiac Imaging Performed: non   Protocol: standard Bruce - maximal  Max BP:  205/85  Max MPHR (bpm):  160 85% MPR (bpm):  130  MPHR obtained (bpm):  144 % MPHR obtained:  90%  Reached 85% MPHR (min:sec):  8:05 Total Exercise Time (min-sec):  9:08  Workload in METS:  10.2 Borg Scale: 15  Reason ETT Terminated:  patient's desire to stop    ST Segment Analysis At Rest: normal ST segments - no evidence of significant ST depression With Exercise: no evidence of significant ST depression  Other Information Arrhythmia:  No Angina during ETT:  absent (0) Quality of ETT:  diagnostic  ETT Interpretation:  normal - no evidence of ischemia by ST analysis  Comments: Good exercise tolerance. No chest pain. Normal BP response to exercise. No ST-T changes to suggest ischemia.   Recommendations: Follow up with METHENEY,CATHERINE, MD as directed. Tereso Newcomer, PA-C  11:32 AM 06/24/2012

## 2012-07-05 ENCOUNTER — Ambulatory Visit: Payer: Self-pay | Admitting: Family Medicine

## 2012-07-05 DIAGNOSIS — Z0289 Encounter for other administrative examinations: Secondary | ICD-10-CM

## 2012-07-11 ENCOUNTER — Ambulatory Visit (INDEPENDENT_AMBULATORY_CARE_PROVIDER_SITE_OTHER): Payer: Medicare Other | Admitting: Licensed Clinical Social Worker

## 2012-07-11 DIAGNOSIS — F339 Major depressive disorder, recurrent, unspecified: Secondary | ICD-10-CM

## 2012-07-11 NOTE — Progress Notes (Signed)
   THERAPIST PROGRESS NOTE  Session Time: 11:15 - 11:45  Participation Level: Active  Behavioral Response: CasualAlertEuthymic  Type of Therapy: Individual Therapy  Treatment Goals addressed: Coping  Interventions: Motivational Interviewing  Summary: Nathan [name] is a 61 y.o. male who presents with depression.  He is pleased with the direction his life is in for future work - loves what he is doing.  His main concerns are around the mixed messages he gets from his wife.  She seems to be at the house a lot - talks positively about him and then distances.  She is upset with her job - crying and wanting to get back together.  He is not committing to anything - he mainly is listening to her.  He wants to be in marital therapy before making any decisions.  They are talking about home schooling for [name] for they do not like what he is getting at school. However there is no structure at home.  .   Suicidal/Homicidal: Nowithout intent/plan  Plan: Return again in 4 weeks.  Diagnosis: Axis I: Major Depressive Disorder recurrent    Axis II: Deferred    Sephiroth Mcluckie,JUDITH A, LCSW 07/11/2012

## 2012-07-29 ENCOUNTER — Emergency Department (INDEPENDENT_AMBULATORY_CARE_PROVIDER_SITE_OTHER)
Admission: EM | Admit: 2012-07-29 | Discharge: 2012-07-29 | Disposition: A | Discharge status: Home / Self Care | Payer: Medicare Other | Source: Home / Self Care | Attending: Family Medicine | Admitting: Family Medicine

## 2012-07-29 DIAGNOSIS — N200 Calculus of kidney: Secondary | ICD-10-CM | POA: Diagnosis not present

## 2012-07-29 LAB — POCT URINALYSIS DIPSTICK
Bilirubin, UA: NEGATIVE
Glucose, UA: NEGATIVE
Ketones, UA: NEGATIVE
Leukocytes, UA: NEGATIVE
Nitrite, UA: NEGATIVE
Protein, UA: NEGATIVE
Spec Grav, UA: 1.025 (ref 1.005–1.03)
Urobilinogen, UA: 2 (ref 0–1)
pH, UA: 6 (ref 5–8)

## 2012-07-29 MED ORDER — KETOROLAC TROMETHAMINE 30 MG/ML IM SOLN
30.0000 mg | Freq: Once | INTRAMUSCULAR | Status: AC
  Administered 2012-07-29: 30 mg via INTRAMUSCULAR

## 2012-07-29 MED ORDER — KETOROLAC TROMETHAMINE 10 MG PO TABS: ORAL_TABLET | ORAL | Status: DC

## 2012-07-29 MED ORDER — TAMSULOSIN HCL 0.4 MG PO CAPS: 0.4000 mg | ORAL_CAPSULE | Freq: Every day | ORAL | Status: DC

## 2012-07-29 NOTE — ED Notes (Addendum)
Nathan [name] complains of stabbing right side abdominal pain since this am. Denies fever, chills, sweats, nausea or vomiting. He has a history of kidney stones.

## 2012-07-29 NOTE — ED Provider Notes (Signed)
History     CSN: [id number]  Arrival date & time 07/29/12  1101   First MD Initiated Contact with Patient 07/29/12 1117      Chief Complaint  Patient presents with  . Abdominal Pain    this am     HPI Comments: Patient awoke with lower abdominal pain this morning that subsequently radiated to his right flank, described as sharp and pulsating.  The pain has improved and he describes it as mild.  He has history of multiple kidney stones in the past (last about 3 years ago).  He usually passes the stones spontaneously, but has had to have lithotripsy twice.  He also has a history of diverticulitis, but that pain is always in the left lower quadrant.  He denies fevers, chills, and sweats.  No distinct urinary symptoms, and no hematuria.  No nausea/vomiting.  Bowel movements have been normal.  He has been on a specific diet in the past for his kidney stones.  Patient is a 61 y.o. male presenting with abdominal pain. The history is provided by the patient.  Abdominal Pain The primary symptoms of the illness include abdominal pain and fatigue. The primary symptoms of the illness do not include fever, shortness of breath, nausea, vomiting, diarrhea, hematemesis, hematochezia or dysuria. The current episode started 3 to 5 hours ago. The onset of the illness was sudden. The problem has been gradually worsening.  Associated with: nothing. The patient has not had a change in bowel habit. Additional symptoms associated with the illness include back pain. Symptoms associated with the illness do not include chills, anorexia, diaphoresis, heartburn, constipation, urgency, hematuria or frequency.    Past Medical History  Diagnosis Date  . Depression   . Hypertension   . Renal disorder   . Renal calculi   . Ligament tear of upper extremity April 2008    Left elbow  . Diverticulitis 2013  . CRPS (complex regional pain syndrome), upper limb     Past Surgical History  Procedure Date  . Rotary cuff  repair     left  . Ulnar nerve repair     Family History  Problem Relation Age of Onset  . Lung cancer Father   . Kidney disease Mother   . Colon cancer Neg Hx     History  Substance Use Topics  . Smoking status: Former Smoker -- 1.0 packs/day for 10 years    Types: Cigarettes    Quit date: 09/25/1981  . Smokeless tobacco: Never Used  . Alcohol Use: 2.0 oz/week    4 drink(s) per week     Comment: Last drink, yeasterday      Review of Systems  Constitutional: Positive for fatigue. Negative for fever, chills and diaphoresis.  Respiratory: Negative for shortness of breath.   Gastrointestinal: Positive for abdominal pain. Negative for heartburn, nausea, vomiting, diarrhea, constipation, hematochezia, anorexia and hematemesis.  Genitourinary: Negative for dysuria, urgency, frequency and hematuria.  Musculoskeletal: Positive for back pain.  All other systems reviewed and are negative.    Allergies  Adhesive and Hydrocodone-acetaminophen  Home Medications   Current Outpatient Rx  Name  Route  Sig  Dispense  Refill  . ALIGN PO CAPS   Oral   Take 1 capsule by mouth daily.   14 capsule   0   . CLONAZEPAM 0.5 MG PO TABS   Oral   Take 1 tablet (0.5 mg total) by mouth 2 (two) times daily as needed.   30 tablet  1   . ESOMEPRAZOLE MAGNESIUM 40 MG PO CPDR   Oral   Take 1 capsule (40 mg total) by mouth daily before breakfast.   30 capsule   3   . LEVETIRACETAM 250 MG PO TABS   Oral   Take 500 mg by mouth every 12 (twelve) hours.         [name] LUMIGAN 0.01 % OP SOLN               . NAPROXEN 250 MG PO TABS   Oral   Take 250 mg by mouth 2 (two) times daily with a meal.         . SERTRALINE HCL 100 MG PO TABS   Oral   Take 1.5 tablets (150 mg total) by mouth daily. Pt's Zoloft was decreased in August.   45 tablet   1   . TRAZODONE HCL 100 MG PO TABS      Take one-half (50 mg) one tablet daily ( 100 mg) at bedtime   30 tablet   2   . KETOROLAC  TROMETHAMINE 10 MG PO TABS      Take one tab by mouth Q6hr   16 tablet   0   . TAMSULOSIN HCL 0.4 MG PO CAPS   Oral   Take 1 capsule (0.4 mg total) by mouth daily after supper.   12 capsule   1     BP 155/93  Pulse 71  Temp 97.3 F (36.3 C) (Oral)  Resp 18  Ht 5\' 5"  (1.651 m)  Wt 173 lb (78.472 kg)  BMI 28.79 kg/m2  SpO2 97%  Physical Exam Nursing notes and Vital Signs reviewed. Appearance:  Patient appears healthy, stated age, and in no acute distress Eyes:  Pupils are equal, round, and reactive to light and accomodation.  Extraocular movement is intact.  Conjunctivae are not inflamed   Pharynx:  Normal; moist mucous membranes  Neck:  Supple.   No adenopathy Lungs:  Clear to auscultation.  Breath sounds are equal.  Heart:  Regular rate and rhythm without murmurs, rubs, or gallops.  Abdomen:  Nontender without masses or hepatosplenomegaly.  Bowel sounds are present.  No CVA or flank tenderness.  Extremities:  No edema.  No calf tenderness Skin:  No rash present.   ED Course  Procedures  none   Labs Reviewed  POCT URINALYSIS DIPSTICK - Abnormal; Notable for the following:  Large blood      1. Nephrolithiasis       MDM  Because patient has passed stones spontaneously in past, will treat expectantly (no CT scan at this time). Toradol 30mg  IM.  Begin oral Toradol and Flomax.  Increase fluid intake.  Strain urine. Followup with PCP if not improving 48 hours. If symptoms become significantly worse during the night or over the weekend, proceed to the local emergency room.         Lattie Haw, MD 07/29/12 [phone]

## 2012-07-30 ENCOUNTER — Telehealth: Payer: Self-pay | Admitting: *Deleted

## 2012-07-31 DIAGNOSIS — M1710 Unilateral primary osteoarthritis, unspecified knee: Secondary | ICD-10-CM | POA: Diagnosis not present

## 2012-08-08 DIAGNOSIS — F329 Major depressive disorder, single episode, unspecified: Secondary | ICD-10-CM | POA: Diagnosis not present

## 2012-08-11 ENCOUNTER — Other Ambulatory Visit (HOSPITAL_COMMUNITY): Payer: Self-pay | Admitting: Psychiatry

## 2012-08-15 ENCOUNTER — Encounter: Payer: Self-pay | Admitting: Family Medicine

## 2012-08-15 ENCOUNTER — Ambulatory Visit (INDEPENDENT_AMBULATORY_CARE_PROVIDER_SITE_OTHER): Payer: Medicare Other | Admitting: Family Medicine

## 2012-08-15 VITALS — BP 128/73 | HR 75 | Ht 67.0 in | Wt 176.0 lb

## 2012-08-15 DIAGNOSIS — F329 Major depressive disorder, single episode, unspecified: Secondary | ICD-10-CM | POA: Diagnosis not present

## 2012-08-15 DIAGNOSIS — M25519 Pain in unspecified shoulder: Secondary | ICD-10-CM | POA: Diagnosis not present

## 2012-08-15 DIAGNOSIS — M25512 Pain in left shoulder: Secondary | ICD-10-CM

## 2012-08-15 DIAGNOSIS — H9209 Otalgia, unspecified ear: Secondary | ICD-10-CM | POA: Diagnosis not present

## 2012-08-15 MED ORDER — HYDROCORTISONE-ACETIC ACID 1-2 % OT SOLN: 4.0000 [drp] | Freq: Two times a day (BID) | OTIC | Status: DC

## 2012-08-15 MED ORDER — FLUTICASONE PROPIONATE 50 MCG/ACT NA SUSP: 2.0000 | Freq: Every day | NASAL | Status: DC

## 2012-08-15 NOTE — Patient Instructions (Addendum)
If your symptoms are not significantly improved in 2 weeks then please come in our for UTI and urinary symmetric specialist.

## 2012-08-15 NOTE — Progress Notes (Signed)
  Subjective:    Patient ID: Nathan [name], male    DOB: 10-Mar-1951, 61 y.o.   MRN: [id number]  HPI Has had left ear pain for over a year. Has been having shoulder pain on that side that radiates down his entire arm.  Says ear itches a lot. No fever. No drainage.  Pain is intermittant.  Feels like a dull ache.  Can hear out of the ear. Pain in in the ear and around the ear and down towards his neck.  No cold sxs.  Denies any hearing loss. He is already seeing orhto for his shoulder and has RSD in the left arm as well   Review of Systems     Objective:   Physical Exam  Constitutional: He is oriented to person, place, and time. He appears well-developed and well-nourished.  HENT:  Head: Normocephalic and atraumatic.  Right Ear: External ear normal.  Left Ear: External ear normal.  Nose: Nose normal.  Mouth/Throat: Oropharynx is clear and moist.       TMs and canals are clear.   Eyes: Conjunctivae normal and EOM are normal. Pupils are equal, round, and reactive to light.  Neck: Neck supple. No thyromegaly present.  Cardiovascular: Normal rate and normal heart sounds.   Pulmonary/Chest: Effort normal and breath sounds normal.  Lymphadenopathy:    He has no cervical adenopathy.  Neurological: He is alert and oriented to person, place, and time.  Skin: Skin is warm and dry.  Psychiatric: He has a normal mood and affect.          Assessment & Plan:  Otalgia - will treat with vosol otic and with nasal steroid spray. If not helping in 2 weeks will refer to ENT. Consider this could be also a neuropathy as well as potentially coming from his neck or shoulder. Maybe more muscular skeletal related. At first I want to treat the ear and rule out any ear problems.

## 2012-08-21 ENCOUNTER — Encounter (HOSPITAL_COMMUNITY): Payer: Self-pay | Admitting: Psychiatry

## 2012-08-21 ENCOUNTER — Ambulatory Visit (INDEPENDENT_AMBULATORY_CARE_PROVIDER_SITE_OTHER): Payer: Commercial Indemnity | Admitting: Psychiatry

## 2012-08-21 VITALS — BP 123/79 | HR 74 | Ht 66.0 in | Wt 180.0 lb

## 2012-08-21 DIAGNOSIS — F339 Major depressive disorder, recurrent, unspecified: Secondary | ICD-10-CM

## 2012-08-21 DIAGNOSIS — F331 Major depressive disorder, recurrent, moderate: Secondary | ICD-10-CM

## 2012-08-21 MED ORDER — TRAZODONE HCL 100 MG PO TABS: ORAL_TABLET | ORAL | Status: DC

## 2012-08-21 MED ORDER — SERTRALINE HCL 50 MG PO TABS: 75.0000 mg | ORAL_TABLET | Freq: Every day | ORAL | Status: DC

## 2012-08-21 NOTE — Progress Notes (Signed)
[facility] Follow-up Outpatient Visit  Nathan [name] 19-Oct-1950  Date: 08/21/2012  History of Chief Complaint:  HPI Comments: Nathan [name] is a 61 y/o male with a past psychiatric history significant for Major Depressive Disorder. The patient is referred for psychiatric services for medication management.   The patient reports that he has some improvement in symptoms as his wife is at home and they are in the process of reconciling.  The patient reports that his wife continues to have mood episodes but reportedly does not take her medications regularly. Nathan [name] has not used clonazepam in 3 weeks.  The patient decreased sertraline to 75 mg and has been doing well.  In the area of affective symptoms, patient appears euthymic. Patient denies current suicidal ideation, intent, or plan. Patient denies current homicidal ideation, intent, or plan. Patient denies auditory hallucinations. Patient denies visual hallucinations. Patient denies symptoms of paranoia. Patient states sleep is fair , with approximately 6 hours of sleep with trazodone. Appetite is good. Energy level is good. Patient reports improvement of symptoms of anhedonia. Patient denies any further hopelessness or guilt, he endorses continued helplessness at his current situation.   Denies any recent episodes const with mania, particularly decreased need for sleep with increased energy, grandiosity, impulsivity, hyperverbal and pressured speech, or increased productivity. Denies any recent symptoms consistent with psychosis, particularly auditory or visual hallucinations, thought broadcasting/insertion/withdrawal, or ideas of reference. Also denies excessive worry to the point of physical symptoms as well as any panic attacks. Denies any history of trauma or symptoms consistent with PTSD such as flashbacks, nightmares, hypervigilance, feelings of numbness or inability to connect with others.   Review of Systems  Respiratory:  Negative.  Cardiovascular: Negative.  Gastrointestinal: Negative for nausea, vomiting, abdominal pain, diarrhea, constipation, blood in stool, abdominal distention, anal bleeding and rectal pain.  Hematological: Negative.   Filed Vitals:   08/21/12 1309  BP: 123/79  Pulse: 74  Height: 5\' 6"  (1.676 m)  Weight: 180 lb (81.647 kg)   Physical Exam  Vitals reviewed.  Constitutional: He appears well-developed and well-nourished. No distress.  Skin: He is not diaphoretic.   Traumatic Brain Injury: No   Past Psychiatric History:  Diagnosis: Major Depression Disorder.   Hospitalizations: Patient denies.   Outpatient Care: Patient denies.   Substance Abuse Care: Patient denies.   Self-Mutilation:Patient denies.   Suicidal Attempts: Patient denies.   Violent Behaviors: *Patient denies.    Past Medical History: Reviewed  Past Medical History  Diagnosis Date  . Depression   . Hypertension   . Renal disorder   . Renal calculi   . Ligament tear of upper extremity April 2008    Left elbow  . Diverticulitis 2013  . CRPS (complex regional pain syndrome), upper limb     History of Loss of Consciousness: Yes-Had a panic attack in 1988  Seizure History: No  Cardiac History: No   Allergies: Reviewed  Allergies  Allergen Reactions  . Adhesive (Tape)     Hives on area wear tape applied  . Hydrocodone-Acetaminophen Itching   Current Medications: Reviewed  Current Outpatient Prescriptions on File Prior to Visit  Medication Sig Dispense Refill  . acetic acid-hydrocortisone (VOSOL-HC) otic solution Place 4 drops into the left ear 2 (two) times daily.  10 mL  0  . bifidobacterium infantis (ALIGN) capsule Take 1 capsule by mouth daily.  14 capsule  0  . clonazePAM (KLONOPIN) 0.5 MG tablet Take 1 tablet (0.5 mg total) by mouth 2 (  two) times daily as needed.  30 tablet  1  . esomeprazole (NEXIUM) 40 MG capsule Take 1 capsule (40 mg total) by mouth daily before breakfast.  30 capsule  3  .  fluticasone (FLONASE) 50 MCG/ACT nasal spray Place 2 sprays into the nose daily.  16 g  0  . ketorolac (TORADOL) 10 MG tablet Take one tab by mouth Q6hr  16 tablet  0  . levETIRAcetam (KEPPRA) 250 MG tablet Take 500 mg by mouth every 12 (twelve) hours.      [name] LUMIGAN 0.01 % SOLN       . naproxen (NAPROSYN) 250 MG tablet Take 250 mg by mouth 2 (two) times daily with a meal.      . sertraline (ZOLOFT) 100 MG tablet Take 1.5 tablets (150 mg total) by mouth daily. Pt's Zoloft was decreased in August.  45 tablet  1  . Tamsulosin HCl (FLOMAX) 0.4 MG CAPS Take 1 capsule (0.4 mg total) by mouth daily after supper.  12 capsule  1  . traZODone (DESYREL) 100 MG tablet Take one-half (50 mg) one tablet daily ( 100 mg) at bedtime  30 tablet  2    Previous Psychotropic Medications: Reviewed  Medication  Dose   Sertraline  50-100 mg   Clonazepam  0.25 to 1 mg    Substance Abuse History in the last 12 months:  Caffeine: Coffee 1 cup per day. Caffeinated Beverages 8-16 ounces per day.  Nicotine:Patient denies.  Alcohol: Patient denies.  Illicit Drugs: Patient denies.   Social History: Reviewed  Current Place of Residence: [place], [place]  Place of Birth:[place]  Family Members: Patient lives by himself, he has four children 75 (F), 89 (M), 61 (F), and 8 (M)  Marital Status: Separated  Children: 4  Sons: 49 (M) and 8 (M)  Daughters: 94 (F), 19 (F)  Relationships: Has a close relationship  Education: Financial planner Problems/Performance: Did well in school  Religious Beliefs/Practices: Prays.  History of Abuse: none  Occupational Experiences: Office manager History: None.  Legal History: Currently  Hobbies/Interests: Watching sports, painter   Family History: Reviewed  Family History  Problem Relation Age of Onset  . Lung cancer Father   . Kidney disease Mother   . Colon cancer Neg Hx     Mental Status Examination/Evaluation:  Objective: Appearance: Casual and Well  Groomed   Eye Contact:: Good   Speech: Clear and Coherent and Normal Rate   Volume: Normal   Mood: "Good"   Affect: Appropriate and Congruent   Thought Process: Coherent, Logical and Loose   Orientation: Full   Thought Content: WDL   Suicidal Thoughts: No   Homicidal Thoughts: No   Judgement: Fair   Insight: Fair   Psychomotor Activity: Normal   Akathisia: No   Handed: Right   Memory: Immediate 3/3, recent 3/3   Concentration: Good   AIMS (if indicated): Not Indicated   Assets: Merchant navy officer  Housing  Social Support    Assessment:  AXIS I  Major Depressive Disorder, recurrent, moderate  AXIS II  No diagnosis   AXIS III  Past Medical History    Diagnosis  Date    .  Depression     .  Diverticulitis     .  Hypertension     .  Renal disorder     .  Renal calculi       AXIS IV  problems with primary support group and Marital  stressor   AXIS V  51-60 moderate symptoms    Treatment Plan/Recommendations:  PLAN:  1. Affirm with the patient that the medications are taken as ordered. Patient expressed understanding of how their medications were to be used.  2. Continue the following psychiatric medications as written prior to this appointment with the following changes:  A) Decrease Sertraline 75 mg B) Discontinue Clonazepam  C) Continue Trazodone-Take one-half (50 mg) to one tablet daily ( 100 mg) at bedtime, patient takes a half tablet on most days.  3. Therapy: brief supportive therapy provided. Continue current services.  4. Risks and benefits, side effects and alternatives discussed with patient, he/she was given an opportunity to ask questions about his medication, illness, and treatment. All current psychiatric medications have been reviewed and discussed with the patient and adjusted as clinically appropriate. The patient has been provided an accurate and updated list of the medications being now prescribed.  5. Patient told to call  clinic if any problems occur. Patient advised to go to ER if s/he should develop SI/HI, side effects, or if symptoms worsen. Has crisis numbers to call if needed.  6. No labs warranted at this time.  7. The patient was encouraged to keep all PCP and specialty clinic appointments.  8. Patient was instructed to return to clinic in 8 weeks.  9. The patient was advised to call and cancel their mental health appointment within 24 hours of the appointment, if they are unable to keep the appointment.  10. The patient expressed understanding the plan an agrees with the above.  Jacqulyn Cane, MD

## 2012-08-22 MED ORDER — TRAZODONE HCL 100 MG PO TABS: ORAL_TABLET | ORAL | Status: DC

## 2012-09-12 DIAGNOSIS — F329 Major depressive disorder, single episode, unspecified: Secondary | ICD-10-CM | POA: Diagnosis not present

## 2012-09-25 HISTORY — PX: REPLACEMENT TOTAL KNEE: SUR1224

## 2012-10-02 ENCOUNTER — Encounter: Payer: Self-pay | Admitting: Family Medicine

## 2012-10-02 ENCOUNTER — Ambulatory Visit (INDEPENDENT_AMBULATORY_CARE_PROVIDER_SITE_OTHER): Payer: Medicare Other | Admitting: Family Medicine

## 2012-10-02 VITALS — BP 126/86 | HR 72 | Ht 67.0 in | Wt 176.0 lb

## 2012-10-02 DIAGNOSIS — H1045 Other chronic allergic conjunctivitis: Secondary | ICD-10-CM | POA: Diagnosis not present

## 2012-10-02 DIAGNOSIS — H1010 Acute atopic conjunctivitis, unspecified eye: Secondary | ICD-10-CM

## 2012-10-02 MED ORDER — OLOPATADINE HCL 0.2 % OP SOLN: OPHTHALMIC | Status: DC

## 2012-10-02 NOTE — Progress Notes (Signed)
CC: Nathan [name] is a 62 y.o. male is here for itching eyes   Subjective: HPI:  Patient reports one month of daily itching of the eyes. Symptoms are present 24 hours a day and do not wake him from sleep. Itching involves both eyes symmetrically and feels as if on the surface of eyes. Symptoms come on gradually. Nothing makes him worse in particular, lubricating drops helpful for no more than an hour. Symptoms are moderate in severity. Often has a gritty accumulation in the eye Upon awakening. No new environmental changes, nor medication changes. He complains of chronic red eyes for glaucoma medications, he's unsure if the redness is worsening. Denies vision loss, foreign body sensation, discharge other than above, hearing loss, sinus pressure, nasal discharge, cough, nor respiratory complaints. Denies periorbital swelling. Denies recent or remote dry mouth   Review Of Systems Outlined In HPI  Past Medical History  Diagnosis Date  . Depression   . Hypertension   . Renal disorder   . Renal calculi   . Ligament tear of upper extremity April 2008    Left elbow  . Diverticulitis 2013  . CRPS (complex regional pain syndrome), upper limb   . Nephrolithiasis      Family History  Problem Relation Age of Onset  . Lung cancer Father   . Kidney disease Mother   . Colon cancer Neg Hx      History  Substance Use Topics  . Smoking status: Former Smoker -- 1.0 packs/day for 10 years    Types: Cigarettes    Quit date: 09/25/1981  . Smokeless tobacco: Never Used  . Alcohol Use: 0.5 oz/week    1 drink(s) per week     Comment: Last drink, yesterday     Objective: Filed Vitals:   10/02/12 1104  BP: 126/86  Pulse: 72    General: Alert and Oriented, No Acute Distress HEENT: Pupils equal, round, reactive to light. Moderate conjunctival injection sparing the rim of the iris  . No discharge from the eye. External ears unremarkable, canals clear with intact TMs with appropriate landmarks.   Middle ear appears open without effusion. Pink inferior turbinates.  Moist mucous membranes, pharynx without inflammation nor lesions.  Neck supple without palpable lymphadenopathy nor abnormal masses. Lungs: Clear to auscultation bilaterally, no wheezing/ronchi/rales.  Comfortable work of breathing. Good air movement.     Assessment & Plan: Nathan [name] was seen today for itching eyes.  Diagnoses and associated orders for this visit:  Allergic conjunctivitis - Olopatadine HCl 0.2 % SOLN; One drop each eye daily for itchiness.   Allergic conjunctivitis: Worsening, reviewed medications and doubt this is side effect of any. Trial of pataday. If not improving will likely need examination under slit-lamp, patient understands.Signs and symptoms requring emergent/urgent reevaluation were discussed with the patient.    Return if symptoms worsen or fail to improve.

## 2012-10-08 DIAGNOSIS — H10409 Unspecified chronic conjunctivitis, unspecified eye: Secondary | ICD-10-CM | POA: Diagnosis not present

## 2012-10-09 DIAGNOSIS — F329 Major depressive disorder, single episode, unspecified: Secondary | ICD-10-CM | POA: Diagnosis not present

## 2012-10-21 ENCOUNTER — Encounter (HOSPITAL_COMMUNITY): Payer: Self-pay | Admitting: Psychiatry

## 2012-10-21 ENCOUNTER — Ambulatory Visit (INDEPENDENT_AMBULATORY_CARE_PROVIDER_SITE_OTHER): Payer: Medicare Other | Admitting: Psychiatry

## 2012-10-21 VITALS — BP 126/84 | HR 77 | Ht 65.0 in | Wt 174.0 lb

## 2012-10-21 DIAGNOSIS — F331 Major depressive disorder, recurrent, moderate: Secondary | ICD-10-CM | POA: Diagnosis not present

## 2012-10-21 DIAGNOSIS — F339 Major depressive disorder, recurrent, unspecified: Secondary | ICD-10-CM

## 2012-10-21 MED ORDER — SERTRALINE HCL 50 MG PO TABS: 75.0000 mg | ORAL_TABLET | Freq: Every day | ORAL | Status: DC

## 2012-10-21 NOTE — Progress Notes (Addendum)
Nathan [name] Follow-up Outpatient Visit  Nathan [name] 05-May-1951  Date: 10/21/2012  History of Chief Complaint:  HPI Comments: Nathan [name] is a 62 y/o male with a past psychiatric history significant for Major Depressive Disorder. The patient is referred for psychiatric services for medication management.   The patient reports he and his wife are currently in counseling.  The patient reports some continued resentment about his wife leaving. He reports that he has spoken to the marriage counselor about their recent separation and his disappointment with her behavior. He continues to enjoy going to classes.  He reports he uses clonazepam rarely for panic attacks.  He states he does not use trazodone every night.  He continues to take sertraline daily and denies any side effects.   In the area of affective symptoms, patient appears euthymic. Patient denies current suicidal ideation, intent, or plan. Patient denies current homicidal ideation, intent, or plan. Patient denies auditory hallucinations. Patient denies visual hallucinations. Patient denies symptoms of paranoia. Patient states sleep is fair , with approximately 6-7 hours of sleep with trazodone. Appetite is good. Energy level is good. Patient reports improvement of symptoms of anhedonia. Patient denies any further hopelessness or guilt, he endorses continued helplessness at his current situation.   Denies any recent episodes const with mania, particularly decreased need for sleep with increased energy, grandiosity, impulsivity, hyperverbal and pressured speech, or increased productivity. Denies any recent symptoms consistent with psychosis, particularly auditory or visual hallucinations, thought broadcasting/insertion/withdrawal, or ideas of reference. Also denies excessive worry to the point of physical symptoms as well as any panic attacks. Denies any history of trauma or symptoms consistent with PTSD such as flashbacks,  nightmares, hypervigilance, feelings of numbness or inability to connect with others.   Review of Systems  Respiratory: Negative.  Cardiovascular: Negative.  Gastrointestinal: Negative for nausea, vomiting, abdominal pain, diarrhea, constipation, blood in stool, abdominal distention, anal bleeding and rectal pain.  Hematological: Negative.   Filed Vitals:   10/21/12 1132  BP: 126/84  Pulse: 77  Height: 5\' 5"  (1.651 m)  Weight: 174 lb (78.926 kg)   Physical Exam  Vitals reviewed.  Constitutional: He appears well-developed and well-nourished. No distress.  Skin: He is not diaphoretic.   Traumatic Brain Injury: No   Past Psychiatric History:  Diagnosis: Major Depression Disorder.   Hospitalizations: Patient denies.   Outpatient Care: Patient denies.   Substance Abuse Care: Patient denies.   Self-Mutilation:Patient denies.   Suicidal Attempts: Patient denies.   Violent Behaviors: *Patient denies.    Past Medical History: Reviewed  Past Medical History  Diagnosis Date  . Depression   . Hypertension   . Renal disorder   . Renal calculi   . Ligament tear of upper extremity April 2008    Left elbow  . Diverticulitis 2013  . CRPS (complex regional pain syndrome), upper limb   . Nephrolithiasis     History of Loss of Consciousness: [place] a panic attack in 1988  Seizure History: No  Cardiac History: No   Allergies: Reviewed  Allergies  Allergen Reactions  . Adhesive (Tape)     Hives on area wear tape applied  . Hydrocodone-Acetaminophen Itching   Current Medications: Reviewed  Current Outpatient Prescriptions on File Prior to Visit  Medication Sig Dispense Refill  . DORZOLAMIDE HCL OP Apply to eye.      . esomeprazole (NEXIUM) 40 MG capsule Take 1 capsule (40 mg total) by mouth daily before breakfast.  30 capsule  3  .  levETIRAcetam (KEPPRA) 250 MG tablet Take 750 mg by mouth daily.       [name] LUMIGAN 0.01 % SOLN       . naproxen (NAPROSYN) 250 MG tablet Take 250 mg  by mouth 2 (two) times daily with a meal.      . Olopatadine HCl 0.2 % SOLN One drop each eye daily for itchiness.  2.5 mL  1  . Polyvinyl Alcohol-Povidone (REFRESH OP) Apply to eye.      . sertraline (ZOLOFT) 50 MG tablet Take 1.5 tablets (75 mg total) by mouth daily. Pt's Zoloft was decreased in August.  45 tablet  2  . traZODone (DESYREL) 100 MG tablet Take one-half (50 mg) to one tablet daily ( 100 mg) at bedtime  30 tablet  2    Previous Psychotropic Medications: Reviewed  Medication  Dose   Sertraline  50-100 mg   Clonazepam  0.25 to 1 mg    Substance Abuse History in the last 12 months:  Caffeine: Coffee 1 cup per day. Caffeinated Beverages 8-16 ounces per day.  Nicotine:Patient denies.  Alcohol: Patient denies.  Illicit Drugs: Patient denies.   Social History: Reviewed  Current Place of Residence: [place], [place]  Place of Birth:[place]  Family Members: Patient lives by himself, he has four children 95 (F), 35 (M), 29 (F), and 8 (M)  Marital Status: Separated  Children: 4  Sons: 42 (M) and 8 (M)  Daughters: 20 (F), 30 (F)  Relationships: Has a close relationship  Education: Financial planner Problems/Performance: Did well in school  Religious Beliefs/Practices: Prays.  History of Abuse: none  Occupational Experiences: Office manager History: None.  Legal History: Currently  Hobbies/Interests: Watching sports, painter   Family History: Reviewed  Family History  Problem Relation Age of Onset  . Lung cancer Father   . Kidney disease Mother   . Colon cancer Neg Hx     Psychiatric specialty examination:  Objective: Appearance: Casual and Well Groomed   Eye Contact:: Good   Speech: Clear and Coherent and Normal Rate   Volume: Normal   Mood: "Good" 7/10  Affect: Appropriate and Congruent   Thought Process: Coherent, Logical and Loose   Orientation: Full   Thought Content: WDL   Suicidal Thoughts: No   Homicidal Thoughts: No   Judgement: Fair     Insight: Fair   Psychomotor Activity: Normal   Akathisia: No   Handed: Right   Memory: Immediate 3/3, recent 3/3   Concentration: Good   AIMS (if indicated): Not Indicated   Assets: Merchant navy officer  Housing  Social Support    Assessment:  AXIS I  Major Depressive Disorder, recurrent, moderate  AXIS II  No diagnosis   AXIS III  Past Medical History    Diagnosis  Date    .  Depression     .  Diverticulitis     .  Hypertension     .  Renal disorder     .  Renal calculi       AXIS IV  problems with primary support group and Marital stressor   AXIS V  51-60 moderate symptoms    Treatment Plan/Recommendations:  PLAN:  1. Affirm with the patient that the medications are taken as ordered. Patient expressed understanding of how their medications were to be used.  2. Continue the following psychiatric medications as written prior to this appointment with the following changes:  A) Continue Sertraline 75 mg B) Will  not prescribe any further Clonazepam.  Patient advised not to use alcohol with clonazepam.  C) Continue Trazodone-Take one-half (50 mg) to one tablet daily ( 100 mg) at bedtime.  Patient has prescription 3. Therapy: brief supportive therapy provided. Continue current services.  4. Risks and benefits, side effects and alternatives discussed with patient, he/she was given an opportunity to ask questions about his medication, illness, and treatment. All current psychiatric medications have been reviewed and discussed with the patient and adjusted as clinically appropriate. The patient has been provided an accurate and updated list of the medications being now prescribed.  5. Patient told to call clinic if any problems occur. Patient advised to go to ER if s/he should develop SI/HI, side effects, or if symptoms worsen. Has crisis numbers to call if needed.  6. No labs warranted at this time.  7. The patient was encouraged to keep all PCP and  specialty clinic appointments.  8. Patient was instructed to return to clinic in 2 months.  9. The patient was advised to call and cancel their mental [name] appointment within 24 hours of the appointment, if they are unable to keep the appointment.  10. The patient expressed understanding the plan an agrees with the above.  Jacqulyn Cane, MD

## 2012-11-12 DIAGNOSIS — F329 Major depressive disorder, single episode, unspecified: Secondary | ICD-10-CM | POA: Diagnosis not present

## 2012-12-11 DIAGNOSIS — F329 Major depressive disorder, single episode, unspecified: Secondary | ICD-10-CM | POA: Diagnosis not present

## 2012-12-19 ENCOUNTER — Ambulatory Visit (INDEPENDENT_AMBULATORY_CARE_PROVIDER_SITE_OTHER): Payer: Medicare Other | Admitting: Psychiatry

## 2012-12-19 ENCOUNTER — Encounter (HOSPITAL_COMMUNITY): Payer: Self-pay | Admitting: Psychiatry

## 2012-12-19 VITALS — BP 106/80 | HR 75 | Ht 65.0 in | Wt 181.0 lb

## 2012-12-19 DIAGNOSIS — F331 Major depressive disorder, recurrent, moderate: Secondary | ICD-10-CM

## 2012-12-19 DIAGNOSIS — F339 Major depressive disorder, recurrent, unspecified: Secondary | ICD-10-CM

## 2012-12-19 MED ORDER — SERTRALINE HCL 50 MG PO TABS: 75.0000 mg | ORAL_TABLET | Freq: Every day | ORAL | Status: DC

## 2012-12-19 NOTE — Progress Notes (Signed)
[facility] Follow-up Outpatient Visit    [name] 14-Apr-1951  Date: 12/19/2012  History of Chief Complaint:  HPI Comments: [name] is a 62 y/o male with a past psychiatric history significant for Major Depressive Disorder. The patient is referred for psychiatric services for medication management.   The patient and his wife are still in marital counseling. He reports they are discussing issues and he will wait to go to counseling to bring up significant issues. The patient is now on gabapentin but is feeling drowsy as he has been titrating his dose. He continues to have anxiety about his shoulder problems and treatment options.  He reports he is taking his medications and denies any side effects.   In the area of affective symptoms, patient appears euthymic. Patient denies current suicidal ideation, intent, or plan. Patient denies current homicidal ideation, intent, or plan. Patient denies auditory hallucinations. Patient denies visual hallucinations. Patient denies symptoms of paranoia. Patient states sleep is fair, with approximately 6-7 hours of sleep with trazodone. Appetite is good. Energy level is good. Patient reports improvement of symptoms of anhedonia. Patient denies any further hopelessness or guilt, he endorses continued helplessness at his current situation.   Denies any recent episodes const with mania, particularly decreased need for sleep with increased energy, grandiosity, impulsivity, hyperverbal and pressured speech, or increased productivity. Denies any recent symptoms consistent with psychosis, particularly auditory or visual hallucinations, thought broadcasting/insertion/withdrawal, or ideas of reference. Also denies excessive worry to the point of physical symptoms as well as any panic attacks. Denies any history of trauma or symptoms consistent with PTSD such as flashbacks, nightmares, hypervigilance, feelings of numbness or inability to connect with  others.   Review of Systems  Respiratory: Negative.  Cardiovascular: Negative.  Gastrointestinal: Negative for nausea, vomiting, abdominal pain, diarrhea, constipation, blood in stool, abdominal distention, anal bleeding and rectal pain.  Hematological: Negative.   Filed Vitals:   12/19/12 1111  BP: 106/80  Pulse: 75  Height: 5\' 5"  (1.651 m)  Weight: 181 lb (82.101 kg)   Physical Exam  Vitals reviewed.  Constitutional: He appears well-developed and well-nourished. No distress.  Skin: He is not diaphoretic.   Traumatic Brain Injury: No   Past Psychiatric History:  Diagnosis: Major Depression Disorder.   Hospitalizations: Patient denies.   Outpatient Care: Patient denies.   Substance Abuse Care: Patient denies.   Self-Mutilation:Patient denies.   Suicidal Attempts: Patient denies.   Violent Behaviors: *Patient denies.    Past Medical History: Reviewed  Past Medical History  Diagnosis Date  . Depression   . Hypertension   . Renal disorder   . Renal calculi   . Ligament tear of upper extremity April 2008    Left elbow  . Diverticulitis 2013  . CRPS (complex regional pain syndrome), upper limb   . Nephrolithiasis     History of Loss of Consciousness: [name] a panic attack in 1988  Seizure History: No  Cardiac History: No   Allergies: Reviewed  Allergies  Allergen Reactions  . Adhesive (Tape)     Hives on area wear tape applied  . Hydrocodone-Acetaminophen Itching   Current Medications: Reviewed  Current Outpatient Prescriptions on File Prior to Visit  Medication Sig Dispense Refill  . DORZOLAMIDE HCL OP Apply to eye.      . esomeprazole (NEXIUM) 40 MG capsule Take 1 capsule (40 mg total) by mouth daily before breakfast.  30 capsule  3  . LUMIGAN 0.01 % SOLN       .  naproxen (NAPROSYN) 250 MG tablet Take 250 mg by mouth 2 (two) times daily with a meal.      . sertraline (ZOLOFT) 50 MG tablet Take 1.5 tablets (75 mg total) by mouth daily. Pt's Zoloft was  decreased in August.  45 tablet  2   No current facility-administered medications on file prior to visit.    Previous Psychotropic Medications: Reviewed  Medication  Dose   Sertraline  50-100 mg   Clonazepam  0.25 to 1 mg    Substance Abuse History in the last 12 months: Reviewed  Caffeine: Coffee 1 cup per day. Caffeinated Beverages 8-16 ounces per day.  Nicotine:Patient denies.  Alcohol: Patient reports periodic use Illicit Drugs: Patient denies.   Social History: Reviewed  Current Place of Residence: [place], [place]  Place of Birth:[place]  Family Members: Patient lives by himself, he has four children 58 (F), 65 (M), 56 (F), and 8 (M)  Marital Status: Separated  Children: 4  Sons: 75 (M) and 8 (M)  Daughters: 66 (F), 16 (F)  Relationships: Has a close relationship  Education: Financial planner Problems/Performance: Did well in school  Religious Beliefs/Practices: Prays.  History of Abuse: none  Occupational Experiences: Office manager History: None.  Legal History: Currently  Hobbies/Interests: Watching sports, painter   Family History: Reviewed  Family History  Problem Relation Age of Onset  . Lung cancer Father   . Kidney disease Mother   . Colon cancer Neg Hx     Psychiatric specialty examination:  Objective: Appearance: Casual and Well Groomed   Eye Contact:: Good   Speech: Clear and Coherent and Normal Rate   Volume: Normal   Mood: "Good" 7/10  Affect: Appropriate and Congruent   Thought Process: Coherent, Logical and Loose   Orientation: Full   Thought Content: WDL   Suicidal Thoughts: No   Homicidal Thoughts: No   Judgement: Fair   Insight: Fair   Psychomotor Activity: Normal   Akathisia: No   Handed: Right   Memory: Immediate 3/3, recent 3/3   Concentration: Good   AIMS (if indicated): Not Indicated   Assets: Merchant navy officer  Housing  Social Support    Assessment:  AXIS I  Major  Depressive Disorder, recurrent, moderate  AXIS II  No diagnosis   AXIS III  Past Medical History    Diagnosis  Date    .  Depression     .  Diverticulitis     .  Hypertension     .  Renal disorder     .  Renal calculi       AXIS IV  problems with primary support group and Marital stressor   AXIS V  51-60 moderate symptoms    Treatment Plan/Recommendations:  PLAN:  1. Affirm with the patient that the medications are taken as ordered. Patient expressed understanding of how their medications were to be used.  2. Continue the following psychiatric medications as written prior to this appointment with the following changes:  A) Continue Sertraline 75 mg B) Discontinue Clonazepam.  C) Discontinue Trazodone-patient is not using this medication. 3. Therapy: brief supportive therapy provided. Continue current services.  4. Risks and benefits, side effects and alternatives discussed with patient, he was given an opportunity to ask questions about his medication, illness, and treatment. All current psychiatric medications have been reviewed and discussed with the patient and adjusted as clinically appropriate. The patient has been provided an accurate and updated list of the  medications being now prescribed.  5. Patient told to call clinic if any problems occur. Patient advised to go to ER if he should develop SI/HI, side effects, or if symptoms worsen. Has crisis numbers to call if needed.  6. No labs warranted at this time.  7. The patient was encouraged to keep all PCP and specialty clinic appointments.  8. Patient was instructed to return to clinic in 3 months.  9. The patient was advised to call and cancel their mental health appointment within 24 hours of the appointment, if they are unable to keep the appointment.  10. The patient expressed understanding the plan an agrees with the above.  Jacqulyn Cane, M.D.  12/19/2012 11:12 AM

## 2013-02-13 DIAGNOSIS — M1710 Unilateral primary osteoarthritis, unspecified knee: Secondary | ICD-10-CM | POA: Diagnosis not present

## 2013-02-28 ENCOUNTER — Telehealth: Payer: Self-pay | Admitting: Family Medicine

## 2013-02-28 NOTE — Telephone Encounter (Signed)
These call patient. I got a note from [facility]opedic specialists at [name]. So they're going to do right knee replacement. They are requesting clearance for surgery. He needs to schedule an appointment to do this, as I have not seen him for 6 months.

## 2013-03-05 NOTE — Telephone Encounter (Signed)
appt made for 6.30@ 1pm.Noni Stonesifer, Viann Shove

## 2013-03-21 ENCOUNTER — Ambulatory Visit (HOSPITAL_COMMUNITY): Payer: Self-pay | Admitting: Psychiatry

## 2013-03-24 ENCOUNTER — Ambulatory Visit (INDEPENDENT_AMBULATORY_CARE_PROVIDER_SITE_OTHER): Payer: Medicare Other | Admitting: Family Medicine

## 2013-03-24 ENCOUNTER — Encounter: Payer: Self-pay | Admitting: Family Medicine

## 2013-03-24 ENCOUNTER — Ambulatory Visit (INDEPENDENT_AMBULATORY_CARE_PROVIDER_SITE_OTHER): Payer: Medicare Other

## 2013-03-24 ENCOUNTER — Other Ambulatory Visit: Payer: Self-pay | Admitting: Family Medicine

## 2013-03-24 VITALS — BP 127/80 | HR 75 | Wt 183.0 lb

## 2013-03-24 DIAGNOSIS — Z01818 Encounter for other preprocedural examination: Secondary | ICD-10-CM

## 2013-03-24 DIAGNOSIS — M25519 Pain in unspecified shoulder: Secondary | ICD-10-CM | POA: Diagnosis not present

## 2013-03-24 DIAGNOSIS — M542 Cervicalgia: Secondary | ICD-10-CM

## 2013-03-24 DIAGNOSIS — M1710 Unilateral primary osteoarthritis, unspecified knee: Secondary | ICD-10-CM

## 2013-03-24 DIAGNOSIS — M79602 Pain in left arm: Secondary | ICD-10-CM

## 2013-03-24 DIAGNOSIS — M25512 Pain in left shoulder: Secondary | ICD-10-CM

## 2013-03-24 DIAGNOSIS — M5030 Other cervical disc degeneration, unspecified cervical region: Secondary | ICD-10-CM | POA: Diagnosis not present

## 2013-03-24 LAB — BASIC METABOLIC PANEL WITH GFR
BUN: 15 mg/dL (ref 6–23)
CO2: 27 mEq/L (ref 19–32)
Calcium: 10 mg/dL (ref 8.4–10.5)
Chloride: 100 mEq/L (ref 96–112)
Creat: 0.84 mg/dL (ref 0.50–1.35)
GFR, Est African American: >89 mL/min
GFR, Est Non African American: >89 mL/min
Glucose, Bld: 94 mg/dL (ref 70–99)
Potassium: 4.2 mEq/L (ref 3.5–5.3)
Sodium: 138 mEq/L (ref 135–145)

## 2013-03-24 LAB — CBC WITH DIFFERENTIAL/PLATELET
Basophils Absolute: 0 10*3/uL (ref 0.0–0.1)
Basophils Relative: 0 % (ref 0–1)
Eosinophils Absolute: 0.3 10*3/uL (ref 0.0–0.7)
Eosinophils Relative: 4 % (ref 0–5)
HCT: 47.1 % (ref 39.0–52.0)
Hemoglobin: 15.9 g/dL (ref 13.0–17.0)
Lymphocytes Relative: 27 % (ref 12–46)
Lymphs Abs: 2.4 10*3/uL (ref 0.7–4.0)
MCH: 28.1 pg (ref 26.0–34.0)
MCHC: 33.8 g/dL (ref 30.0–36.0)
MCV: 83.4 fL (ref 78.0–100.0)
Monocytes Absolute: 0.5 10*3/uL (ref 0.1–1.0)
Monocytes Relative: 6 % (ref 3–12)
Neutro Abs: 5.4 10*3/uL (ref 1.7–7.7)
Neutrophils Relative %: 63 % (ref 43–77)
Platelets: 204 10*3/uL (ref 150–400)
RBC: 5.65 MIL/uL (ref 4.22–5.81)
RDW: 15.3 % (ref 11.5–15.5)
WBC: 8.6 10*3/uL (ref 4.0–10.5)

## 2013-03-24 NOTE — Progress Notes (Signed)
Subjective:    Patient ID: [name], male    DOB: Feb 16, 1951, 62 y.o.   MRN: [id number]  HPI Here for pre-op for Right knee replacement. Had athroscopy about 8 years ago.  Seeing Dr. Vernell Morgans at [facility] specialists.   He has a history of hypertension, hyperlipidemia, neuropathy, fatty liver disease, GERD. No prior problems with his heart. No history of pulmonary disease. No recent chest pain or shortness of breath.No swelling. No palpitations. Not a smoker.   He is having pain in his left shoulder. Radiates to his neck. Says not sure if the pain is from his neck radiating to his shoulder or vice versa. This is the side her has the RMD,nerve damage and weakness. He has had 3 surgeries on the elbow and one surgery on the left shoulder.  He does take Neurontin for this left arm pain. He thinks he's been gaining weight on the Neurontin.  Current Outpatient Prescriptions on File Prior to Visit  Medication Sig Dispense Refill  . DORZOLAMIDE HCL OP Apply to eye.      . esomeprazole (NEXIUM) 40 MG capsule Take 1 capsule (40 mg total) by mouth daily before breakfast.  30 capsule  3  . Gabapentin, PHN, 300 MG TABS Take 1,200 mg by mouth daily.      [name] LUMIGAN 0.01 % SOLN       . naproxen (NAPROSYN) 250 MG tablet Take 250 mg by mouth 2 (two) times daily with a meal.      . sertraline (ZOLOFT) 50 MG tablet Take 1.5 tablets (75 mg total) by mouth daily. Pt's Zoloft was decreased in August.  45 tablet  3   No current facility-administered medications on file prior to visit.      Review of Systems BP 127/80  Pulse 75  Wt 183 lb (83.008 kg)  BMI 30.45 kg/m2    Allergies  Allergen Reactions  . Adhesive (Tape)     Hives on area wear tape applied  . Hydrocodone-Acetaminophen Itching    Past Medical History  Diagnosis Date  . Depression   . Hypertension   . Renal disorder   . Renal calculi   . Ligament tear of upper extremity April 2008    Left elbow  . Diverticulitis 2013   . CRPS (complex regional pain syndrome), upper limb   . Nephrolithiasis     Past Surgical History  Procedure Laterality Date  . Rotary cuff repair      left  . Ulnar nerve repair      History   Social History  . Marital Status: Married    Spouse Name: N/A    Number of Children: 1  . Years of Education: N/A   Occupational History  . AIRCRAFT INT [employer]   Social History Main Topics  . Smoking status: Former Smoker -- 1.00 packs/day for 10 years    Types: Cigarettes    Quit date: 09/25/1981  . Smokeless tobacco: Never Used  . Alcohol Use: 1.2 oz/week    2 Cans of beer per week     Comment: Last drink, yesterday  . Drug Use: No  . Sexually Active: Yes -- Male partner(s)   Other Topics Concern  . Not on file   Social History Narrative   Seperated from his wife.   Caffeine daily    Family History  Problem Relation Age of Onset  . Lung cancer Father   . Kidney disease Mother   . Colon cancer Neg Hx  Outpatient Encounter Prescriptions as of 03/24/2013  Medication Sig Dispense Refill  . DORZOLAMIDE HCL OP Apply to eye.      . esomeprazole (NEXIUM) 40 MG capsule Take 1 capsule (40 mg total) by mouth daily before breakfast.  30 capsule  3  . Gabapentin, PHN, 300 MG TABS Take 1,200 mg by mouth daily.      [name] LUMIGAN 0.01 % SOLN       . naproxen (NAPROSYN) 250 MG tablet Take 250 mg by mouth 2 (two) times daily with a meal.      . sertraline (ZOLOFT) 50 MG tablet Take 1.5 tablets (75 mg total) by mouth daily. Pt's Zoloft was decreased in August.  45 tablet  3   No facility-administered encounter medications on file as of 03/24/2013.          Objective:   Physical Exam  Constitutional: He is oriented to person, place, and time. He appears well-developed and well-nourished.  HENT:  Head: Normocephalic and atraumatic.  Right Ear: External ear normal.  Left Ear: External ear normal.  Nose: Nose normal.  Mouth/Throat: Oropharynx is clear and moist.   Eyes: Conjunctivae and EOM are normal. Pupils are equal, round, and reactive to light.  Neck: Normal range of motion. Neck supple. No thyromegaly present.  Cardiovascular: Normal rate, regular rhythm, normal heart sounds and intact distal pulses.   Pulmonary/Chest: Effort normal and breath sounds normal.  Abdominal: Soft. Bowel sounds are normal. He exhibits no distension and no mass. There is no tenderness. There is no rebound and no guarding.  Musculoskeletal: Normal range of motion.  Lymphadenopathy:    He has no cervical adenopathy.  Neurological: He is alert and oriented to person, place, and time. He has normal reflexes.  Skin: Skin is warm and dry.  Psychiatric: He has a normal mood and affect. His behavior is normal. Judgment and thought content normal.          Assessment & Plan:  Surgical clearance for knee replacement. He is cleared for surgery. No prior history of pulmonary or cardiac problems. Blood pressure is well-controlled today. EKG shows rate of 75 beats per minute, normal sinus rhythm. Incomplete right bundle branch block. This is unchanged from May of 2013. I attached his EKG from today as well as his old one for comparison.  Neck pain/left shoulder pain-will get x-rays today. 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

## 2013-03-24 NOTE — Progress Notes (Signed)
Quick Note:  All labs are normal. ______ 

## 2013-03-25 NOTE — Progress Notes (Signed)
Quick Note:  All labs are normal. ______ 

## 2013-03-26 ENCOUNTER — Telehealth: Payer: Self-pay | Admitting: *Deleted

## 2013-03-26 NOTE — Telephone Encounter (Signed)
Called medicare for prior auth & was told that they will cover MRI c-spine.  No auth number available. Imaging dept notified.

## 2013-03-27 ENCOUNTER — Ambulatory Visit (INDEPENDENT_AMBULATORY_CARE_PROVIDER_SITE_OTHER): Payer: Medicare Other | Admitting: Psychiatry

## 2013-03-27 ENCOUNTER — Encounter (HOSPITAL_COMMUNITY): Payer: Self-pay | Admitting: Psychiatry

## 2013-03-27 VITALS — BP 108/71 | HR 71 | Ht 65.0 in | Wt 185.5 lb

## 2013-03-27 DIAGNOSIS — F339 Major depressive disorder, recurrent, unspecified: Secondary | ICD-10-CM

## 2013-03-27 DIAGNOSIS — F331 Major depressive disorder, recurrent, moderate: Secondary | ICD-10-CM | POA: Diagnosis not present

## 2013-03-27 MED ORDER — SERTRALINE HCL 50 MG PO TABS: 75.0000 mg | ORAL_TABLET | Freq: Every day | ORAL | Status: DC

## 2013-03-27 NOTE — Progress Notes (Signed)
[facility] Follow-up Outpatient Visit     [name] 1950-10-17  Date: 03/27/2013  History of Chief Complaint:  HPI Comments: [name] is a 62 y/o male with a past psychiatric history significant for Major Depressive Disorder. The patient is referred for psychiatric services for medication management.   The patient reports some frustration about the limitations to physical activity due to a left shoulder injury which occurred 6 years ago at work.  He continues to go to school. His marital problems are starting to resolve and his wife is staying with him and their child. He is still waiting for his case against his employer to settle.  He reports he is taking his medications and denies any side effects.   In the area of affective symptoms, patient appears euthymic. Patient denies current suicidal ideation, intent, or plan. Patient denies current homicidal ideation, intent, or plan. Patient denies auditory hallucinations. Patient denies visual hallucinations. Patient denies symptoms of paranoia. Patient states sleep is fair, with approximately 6 hours of sleep with trazodone. Appetite is good. Energy level is fair. Patient reports improvement of symptoms of anhedonia. Patient denies any further hopelessness or guilt, he endorses continued helplessness at his current situation.   Denies any recent episodes const with mania, particularly decreased need for sleep with increased energy, grandiosity, impulsivity, hyperverbal and pressured speech, or increased productivity. Denies any recent symptoms consistent with psychosis, particularly auditory or visual hallucinations, thought broadcasting/insertion/withdrawal, or ideas of reference. Also denies excessive worry to the point of physical symptoms as well as any panic attacks. Denies any history of trauma or symptoms consistent with PTSD such as flashbacks, nightmares, hypervigilance, feelings of numbness or inability to connect with  others.   Review of Systems  Constitutional: Negative for fever, chills and weight loss.  HENT: Positive for neck pain.   Cardiovascular: Negative for chest pain, palpitations and leg swelling.  Gastrointestinal: Negative for heartburn, nausea, vomiting and abdominal pain.  Musculoskeletal: Positive for joint pain (Right knee and left shoulder.).  Neurological: Negative for dizziness, tremors, focal weakness and seizures.      Filed Vitals:   03/27/13 1403  BP: 108/71  Pulse: 71  Height: 5\' 5"  (1.651 m)  Weight: 185 lb 8 oz (84.142 kg)   Physical Exam  Vitals reviewed.  Constitutional: He appears well-developed and well-nourished. No distress.  Skin: He is not diaphoretic.  Musculoskeletal: Strength & Muscle Tone: within normal limits Gait & Station: normal Patient leans: N/A   Traumatic Brain Injury: No   Past Psychiatric History: Reviewed  Diagnosis: Major Depression Disorder.   Hospitalizations: Patient denies.   Outpatient Care: Patient denies.   Substance Abuse Care: Patient denies.   Self-Mutilation:Patient denies.   Suicidal Attempts: Patient denies.   Violent Behaviors: *Patient denies.    Past Medical History: Reviewed  Past Medical History  Diagnosis Date  . Depression   . Hypertension   . Renal disorder   . Renal calculi   . Ligament tear of upper extremity April 2008    Left elbow  . Diverticulitis 2013  . CRPS (complex regional pain syndrome), upper limb   . Nephrolithiasis     History of Loss of Consciousness: [name] a panic attack in 1988  Seizure History: No  Cardiac History: No   Allergies: Reviewed  Allergies  Allergen Reactions  . Adhesive (Tape)     Hives on area wear tape applied  . Hydrocodone-Acetaminophen Itching   Current Medications: Reviewed  Current Outpatient Prescriptions on File Prior  to Visit  Medication Sig Dispense Refill  . DORZOLAMIDE HCL OP Apply to eye.      . esomeprazole (NEXIUM) 40 MG capsule Take 1 capsule  (40 mg total) by mouth daily before breakfast.  30 capsule  3  . Gabapentin, PHN, 300 MG TABS Take 1,200 mg by mouth daily.      [name] LUMIGAN 0.01 % SOLN       . naproxen (NAPROSYN) 250 MG tablet Take 250 mg by mouth 2 (two) times daily with a meal.      . sertraline (ZOLOFT) 50 MG tablet Take 1.5 tablets (75 mg total) by mouth daily. Pt's Zoloft was decreased in August.  45 tablet  3   No current facility-administered medications on file prior to visit.    Previous Psychotropic Medications: Reviewed  Medication  Dose   Sertraline  50-100 mg   Clonazepam  0.25 to 1 mg    Substance Abuse History in the last 12 months: Reviewed  Caffeine: Coffee 1 cup per day. Caffeinated Beverages 8-16 ounces per day.  Nicotine:Patient denies.  Alcohol: Patient reports periodic use-1-2 cups per day Illicit Drugs: Patient denies.   Social History: Reviewed  Current Place of Residence: [place], [place]  Place of Birth:[place]  Family Members: Patient lives by himself, he has four children 26 (F), 37 (M), 24 (F), and 8 (M)  Marital Status: Married Children: 4  Sons: 58 (M) and 8 (M)  Daughters: 70 (F), 51 (F)  Relationships: Has a close relationship  Education: Financial planner Problems/Performance: Did well in school  Religious Beliefs/Practices: Prays.  History of Abuse: none  Occupational Experiences: Office manager History: None.  Legal History: Currently  Hobbies/Interests: Watching sports, painter   Family History: Reviewed  Family History  Problem Relation Age of Onset  . Lung cancer Father   . Kidney disease Mother   . Colon cancer Neg Hx     Psychiatric specialty examination:  Objective: Appearance: Casual and Well Groomed   Eye Contact:: Good   Speech: Clear and Coherent and Normal Rate   Volume: Normal   Mood: "Okay" 7/10 (0=very depressed; 5= neutral; 10=very happy)  Affect: Appropriate and Congruent   Thought Process: Coherent, Logical and Loose    Orientation: Full   Thought Content: WDL   Suicidal Thoughts: No   Homicidal Thoughts: No   Judgement: Fair   Insight: Fair   Psychomotor Activity: Normal   Akathisia: No   Handed: Right   Memory: Immediate 3/3, recent 3/3   Concentration: Good   AIMS (if indicated): Not Indicated   Assets: Merchant navy officer  Housing  Social Support    Assessment:  AXIS I  Major Depressive Disorder, recurrent, moderate  AXIS II  No diagnosis   AXIS III  Past Medical History    Diagnosis  Date    .  Depression     .  Diverticulitis     .  Hypertension     .  Renal disorder     .  Renal calculi       AXIS IV  problems with primary support group and Marital stressor   AXIS V  51-60 moderate symptoms    Treatment Plan/Recommendations:  PLAN:  1. Affirm with the patient that the medications are taken as ordered. Patient expressed understanding of how their medications were to be used.  2. Continue the following psychiatric medications as written prior to this appointment with the following changes:  A)  Continue Sertraline 75 mg 3. Therapy: brief supportive therapy provided. Continue current services.  4. Risks and benefits, side effects and alternatives discussed with patient, he was given an opportunity to ask questions about his medication, illness, and treatment. All current psychiatric medications have been reviewed and discussed with the patient and adjusted as clinically appropriate. The patient has been provided an accurate and updated list of the medications being now prescribed.  5. Patient told to call clinic if any problems occur. Patient advised to go to ER if he should develop SI/HI, side effects, or if symptoms worsen. Has crisis numbers to call if needed.  6. No labs warranted at this time.  7. The patient was encouraged to keep all PCP and specialty clinic appointments.  8. Patient was instructed to return to clinic in 3 months.  9. The patient  was advised to call and cancel their mental health appointment within 24 hours of the appointment, if they are unable to keep the appointment.  10. The patient expressed understanding the plan an agrees with the above.  Jacqulyn Cane, M.D.  03/27/2013 2:00 PM

## 2013-04-01 ENCOUNTER — Encounter: Payer: Self-pay | Admitting: *Deleted

## 2013-04-02 DIAGNOSIS — M25569 Pain in unspecified knee: Secondary | ICD-10-CM | POA: Diagnosis not present

## 2013-04-02 DIAGNOSIS — Z471 Aftercare following joint replacement surgery: Secondary | ICD-10-CM | POA: Diagnosis not present

## 2013-04-02 DIAGNOSIS — K219 Gastro-esophageal reflux disease without esophagitis: Secondary | ICD-10-CM | POA: Diagnosis present

## 2013-04-02 DIAGNOSIS — H409 Unspecified glaucoma: Secondary | ICD-10-CM | POA: Diagnosis present

## 2013-04-02 DIAGNOSIS — Z888 Allergy status to other drugs, medicaments and biological substances status: Secondary | ICD-10-CM | POA: Diagnosis not present

## 2013-04-02 DIAGNOSIS — Z01818 Encounter for other preprocedural examination: Secondary | ICD-10-CM | POA: Diagnosis not present

## 2013-04-02 DIAGNOSIS — M1710 Unilateral primary osteoarthritis, unspecified knee: Secondary | ICD-10-CM | POA: Diagnosis not present

## 2013-04-02 DIAGNOSIS — K5730 Diverticulosis of large intestine without perforation or abscess without bleeding: Secondary | ICD-10-CM | POA: Diagnosis present

## 2013-04-02 DIAGNOSIS — Z87442 Personal history of urinary calculi: Secondary | ICD-10-CM | POA: Diagnosis not present

## 2013-04-02 DIAGNOSIS — Z96659 Presence of unspecified artificial knee joint: Secondary | ICD-10-CM | POA: Diagnosis not present

## 2013-04-12 DIAGNOSIS — H409 Unspecified glaucoma: Secondary | ICD-10-CM | POA: Diagnosis not present

## 2013-04-12 DIAGNOSIS — G5790 Unspecified mononeuropathy of unspecified lower limb: Secondary | ICD-10-CM | POA: Diagnosis not present

## 2013-04-12 DIAGNOSIS — Z96659 Presence of unspecified artificial knee joint: Secondary | ICD-10-CM | POA: Diagnosis not present

## 2013-04-12 DIAGNOSIS — Z471 Aftercare following joint replacement surgery: Secondary | ICD-10-CM | POA: Diagnosis not present

## 2013-04-12 DIAGNOSIS — M1710 Unilateral primary osteoarthritis, unspecified knee: Secondary | ICD-10-CM | POA: Diagnosis not present

## 2013-04-12 DIAGNOSIS — F329 Major depressive disorder, single episode, unspecified: Secondary | ICD-10-CM | POA: Diagnosis not present

## 2013-04-14 DIAGNOSIS — F329 Major depressive disorder, single episode, unspecified: Secondary | ICD-10-CM | POA: Diagnosis not present

## 2013-04-14 DIAGNOSIS — Z471 Aftercare following joint replacement surgery: Secondary | ICD-10-CM | POA: Diagnosis not present

## 2013-04-14 DIAGNOSIS — H409 Unspecified glaucoma: Secondary | ICD-10-CM | POA: Diagnosis not present

## 2013-04-14 DIAGNOSIS — G5790 Unspecified mononeuropathy of unspecified lower limb: Secondary | ICD-10-CM | POA: Diagnosis not present

## 2013-04-14 DIAGNOSIS — M1710 Unilateral primary osteoarthritis, unspecified knee: Secondary | ICD-10-CM | POA: Diagnosis not present

## 2013-04-14 DIAGNOSIS — Z96659 Presence of unspecified artificial knee joint: Secondary | ICD-10-CM | POA: Diagnosis not present

## 2013-04-15 DIAGNOSIS — Z471 Aftercare following joint replacement surgery: Secondary | ICD-10-CM | POA: Diagnosis not present

## 2013-04-15 DIAGNOSIS — H409 Unspecified glaucoma: Secondary | ICD-10-CM | POA: Diagnosis not present

## 2013-04-15 DIAGNOSIS — M1710 Unilateral primary osteoarthritis, unspecified knee: Secondary | ICD-10-CM | POA: Diagnosis not present

## 2013-04-15 DIAGNOSIS — F329 Major depressive disorder, single episode, unspecified: Secondary | ICD-10-CM | POA: Diagnosis not present

## 2013-04-15 DIAGNOSIS — G5790 Unspecified mononeuropathy of unspecified lower limb: Secondary | ICD-10-CM | POA: Diagnosis not present

## 2013-04-15 DIAGNOSIS — Z96659 Presence of unspecified artificial knee joint: Secondary | ICD-10-CM | POA: Diagnosis not present

## 2013-04-16 DIAGNOSIS — H409 Unspecified glaucoma: Secondary | ICD-10-CM | POA: Diagnosis not present

## 2013-04-16 DIAGNOSIS — F329 Major depressive disorder, single episode, unspecified: Secondary | ICD-10-CM | POA: Diagnosis not present

## 2013-04-16 DIAGNOSIS — G5790 Unspecified mononeuropathy of unspecified lower limb: Secondary | ICD-10-CM | POA: Diagnosis not present

## 2013-04-16 DIAGNOSIS — Z471 Aftercare following joint replacement surgery: Secondary | ICD-10-CM | POA: Diagnosis not present

## 2013-04-16 DIAGNOSIS — M1710 Unilateral primary osteoarthritis, unspecified knee: Secondary | ICD-10-CM | POA: Diagnosis not present

## 2013-04-16 DIAGNOSIS — Z96659 Presence of unspecified artificial knee joint: Secondary | ICD-10-CM | POA: Diagnosis not present

## 2013-04-17 DIAGNOSIS — Z471 Aftercare following joint replacement surgery: Secondary | ICD-10-CM | POA: Diagnosis not present

## 2013-04-17 DIAGNOSIS — F329 Major depressive disorder, single episode, unspecified: Secondary | ICD-10-CM | POA: Diagnosis not present

## 2013-04-17 DIAGNOSIS — Z96659 Presence of unspecified artificial knee joint: Secondary | ICD-10-CM | POA: Diagnosis not present

## 2013-04-17 DIAGNOSIS — M1710 Unilateral primary osteoarthritis, unspecified knee: Secondary | ICD-10-CM | POA: Diagnosis not present

## 2013-04-17 DIAGNOSIS — H409 Unspecified glaucoma: Secondary | ICD-10-CM | POA: Diagnosis not present

## 2013-04-17 DIAGNOSIS — G5790 Unspecified mononeuropathy of unspecified lower limb: Secondary | ICD-10-CM | POA: Diagnosis not present

## 2013-04-18 DIAGNOSIS — Z96659 Presence of unspecified artificial knee joint: Secondary | ICD-10-CM | POA: Diagnosis not present

## 2013-04-18 DIAGNOSIS — G5790 Unspecified mononeuropathy of unspecified lower limb: Secondary | ICD-10-CM | POA: Diagnosis not present

## 2013-04-18 DIAGNOSIS — M1710 Unilateral primary osteoarthritis, unspecified knee: Secondary | ICD-10-CM | POA: Diagnosis not present

## 2013-04-18 DIAGNOSIS — F329 Major depressive disorder, single episode, unspecified: Secondary | ICD-10-CM | POA: Diagnosis not present

## 2013-04-18 DIAGNOSIS — H409 Unspecified glaucoma: Secondary | ICD-10-CM | POA: Diagnosis not present

## 2013-04-18 DIAGNOSIS — Z471 Aftercare following joint replacement surgery: Secondary | ICD-10-CM | POA: Diagnosis not present

## 2013-04-21 DIAGNOSIS — M1710 Unilateral primary osteoarthritis, unspecified knee: Secondary | ICD-10-CM | POA: Diagnosis not present

## 2013-04-21 DIAGNOSIS — G5790 Unspecified mononeuropathy of unspecified lower limb: Secondary | ICD-10-CM | POA: Diagnosis not present

## 2013-04-21 DIAGNOSIS — H409 Unspecified glaucoma: Secondary | ICD-10-CM | POA: Diagnosis not present

## 2013-04-21 DIAGNOSIS — F329 Major depressive disorder, single episode, unspecified: Secondary | ICD-10-CM | POA: Diagnosis not present

## 2013-04-21 DIAGNOSIS — Z471 Aftercare following joint replacement surgery: Secondary | ICD-10-CM | POA: Diagnosis not present

## 2013-04-21 DIAGNOSIS — Z96659 Presence of unspecified artificial knee joint: Secondary | ICD-10-CM | POA: Diagnosis not present

## 2013-04-22 DIAGNOSIS — G5790 Unspecified mononeuropathy of unspecified lower limb: Secondary | ICD-10-CM | POA: Diagnosis not present

## 2013-04-22 DIAGNOSIS — F329 Major depressive disorder, single episode, unspecified: Secondary | ICD-10-CM | POA: Diagnosis not present

## 2013-04-22 DIAGNOSIS — Z96659 Presence of unspecified artificial knee joint: Secondary | ICD-10-CM | POA: Diagnosis not present

## 2013-04-22 DIAGNOSIS — Z471 Aftercare following joint replacement surgery: Secondary | ICD-10-CM | POA: Diagnosis not present

## 2013-04-22 DIAGNOSIS — H409 Unspecified glaucoma: Secondary | ICD-10-CM | POA: Diagnosis not present

## 2013-04-22 DIAGNOSIS — M1710 Unilateral primary osteoarthritis, unspecified knee: Secondary | ICD-10-CM | POA: Diagnosis not present

## 2013-04-23 DIAGNOSIS — Z96659 Presence of unspecified artificial knee joint: Secondary | ICD-10-CM | POA: Diagnosis not present

## 2013-04-23 DIAGNOSIS — M1710 Unilateral primary osteoarthritis, unspecified knee: Secondary | ICD-10-CM | POA: Diagnosis not present

## 2013-04-23 DIAGNOSIS — G5790 Unspecified mononeuropathy of unspecified lower limb: Secondary | ICD-10-CM | POA: Diagnosis not present

## 2013-04-23 DIAGNOSIS — H409 Unspecified glaucoma: Secondary | ICD-10-CM | POA: Diagnosis not present

## 2013-04-23 DIAGNOSIS — Z471 Aftercare following joint replacement surgery: Secondary | ICD-10-CM | POA: Diagnosis not present

## 2013-04-23 DIAGNOSIS — F329 Major depressive disorder, single episode, unspecified: Secondary | ICD-10-CM | POA: Diagnosis not present

## 2013-04-25 DIAGNOSIS — M1710 Unilateral primary osteoarthritis, unspecified knee: Secondary | ICD-10-CM | POA: Diagnosis not present

## 2013-04-25 DIAGNOSIS — H409 Unspecified glaucoma: Secondary | ICD-10-CM | POA: Diagnosis not present

## 2013-04-25 DIAGNOSIS — F329 Major depressive disorder, single episode, unspecified: Secondary | ICD-10-CM | POA: Diagnosis not present

## 2013-04-25 DIAGNOSIS — Z471 Aftercare following joint replacement surgery: Secondary | ICD-10-CM | POA: Diagnosis not present

## 2013-04-25 DIAGNOSIS — Z96659 Presence of unspecified artificial knee joint: Secondary | ICD-10-CM | POA: Diagnosis not present

## 2013-04-25 DIAGNOSIS — G5790 Unspecified mononeuropathy of unspecified lower limb: Secondary | ICD-10-CM | POA: Diagnosis not present

## 2013-04-28 DIAGNOSIS — F329 Major depressive disorder, single episode, unspecified: Secondary | ICD-10-CM | POA: Diagnosis not present

## 2013-04-28 DIAGNOSIS — M1710 Unilateral primary osteoarthritis, unspecified knee: Secondary | ICD-10-CM | POA: Diagnosis not present

## 2013-04-28 DIAGNOSIS — Z471 Aftercare following joint replacement surgery: Secondary | ICD-10-CM | POA: Diagnosis not present

## 2013-04-28 DIAGNOSIS — G5790 Unspecified mononeuropathy of unspecified lower limb: Secondary | ICD-10-CM | POA: Diagnosis not present

## 2013-04-28 DIAGNOSIS — H409 Unspecified glaucoma: Secondary | ICD-10-CM | POA: Diagnosis not present

## 2013-04-28 DIAGNOSIS — Z96659 Presence of unspecified artificial knee joint: Secondary | ICD-10-CM | POA: Diagnosis not present

## 2013-04-30 DIAGNOSIS — M1710 Unilateral primary osteoarthritis, unspecified knee: Secondary | ICD-10-CM | POA: Diagnosis not present

## 2013-04-30 DIAGNOSIS — F329 Major depressive disorder, single episode, unspecified: Secondary | ICD-10-CM | POA: Diagnosis not present

## 2013-04-30 DIAGNOSIS — G5790 Unspecified mononeuropathy of unspecified lower limb: Secondary | ICD-10-CM | POA: Diagnosis not present

## 2013-04-30 DIAGNOSIS — H409 Unspecified glaucoma: Secondary | ICD-10-CM | POA: Diagnosis not present

## 2013-04-30 DIAGNOSIS — Z96659 Presence of unspecified artificial knee joint: Secondary | ICD-10-CM | POA: Diagnosis not present

## 2013-04-30 DIAGNOSIS — Z471 Aftercare following joint replacement surgery: Secondary | ICD-10-CM | POA: Diagnosis not present

## 2013-05-02 DIAGNOSIS — H409 Unspecified glaucoma: Secondary | ICD-10-CM | POA: Diagnosis not present

## 2013-05-02 DIAGNOSIS — M1710 Unilateral primary osteoarthritis, unspecified knee: Secondary | ICD-10-CM | POA: Diagnosis not present

## 2013-05-02 DIAGNOSIS — F329 Major depressive disorder, single episode, unspecified: Secondary | ICD-10-CM | POA: Diagnosis not present

## 2013-05-02 DIAGNOSIS — G5790 Unspecified mononeuropathy of unspecified lower limb: Secondary | ICD-10-CM | POA: Diagnosis not present

## 2013-05-02 DIAGNOSIS — Z96659 Presence of unspecified artificial knee joint: Secondary | ICD-10-CM | POA: Diagnosis not present

## 2013-05-02 DIAGNOSIS — Z471 Aftercare following joint replacement surgery: Secondary | ICD-10-CM | POA: Diagnosis not present

## 2013-05-07 DIAGNOSIS — G5790 Unspecified mononeuropathy of unspecified lower limb: Secondary | ICD-10-CM | POA: Diagnosis not present

## 2013-05-07 DIAGNOSIS — M1710 Unilateral primary osteoarthritis, unspecified knee: Secondary | ICD-10-CM | POA: Diagnosis not present

## 2013-05-07 DIAGNOSIS — Z471 Aftercare following joint replacement surgery: Secondary | ICD-10-CM | POA: Diagnosis not present

## 2013-05-07 DIAGNOSIS — H409 Unspecified glaucoma: Secondary | ICD-10-CM | POA: Diagnosis not present

## 2013-05-07 DIAGNOSIS — Z96659 Presence of unspecified artificial knee joint: Secondary | ICD-10-CM | POA: Diagnosis not present

## 2013-05-07 DIAGNOSIS — F329 Major depressive disorder, single episode, unspecified: Secondary | ICD-10-CM | POA: Diagnosis not present

## 2013-05-09 DIAGNOSIS — H409 Unspecified glaucoma: Secondary | ICD-10-CM | POA: Diagnosis not present

## 2013-05-09 DIAGNOSIS — Z09 Encounter for follow-up examination after completed treatment for conditions other than malignant neoplasm: Secondary | ICD-10-CM | POA: Diagnosis not present

## 2013-05-09 DIAGNOSIS — F329 Major depressive disorder, single episode, unspecified: Secondary | ICD-10-CM | POA: Diagnosis not present

## 2013-05-09 DIAGNOSIS — Z96659 Presence of unspecified artificial knee joint: Secondary | ICD-10-CM | POA: Diagnosis not present

## 2013-05-09 DIAGNOSIS — M1710 Unilateral primary osteoarthritis, unspecified knee: Secondary | ICD-10-CM | POA: Diagnosis not present

## 2013-05-09 DIAGNOSIS — G5790 Unspecified mononeuropathy of unspecified lower limb: Secondary | ICD-10-CM | POA: Diagnosis not present

## 2013-05-09 DIAGNOSIS — Z471 Aftercare following joint replacement surgery: Secondary | ICD-10-CM | POA: Diagnosis not present

## 2013-05-12 ENCOUNTER — Telehealth (HOSPITAL_COMMUNITY): Payer: Self-pay

## 2013-05-12 DIAGNOSIS — H409 Unspecified glaucoma: Secondary | ICD-10-CM | POA: Diagnosis not present

## 2013-05-12 DIAGNOSIS — F329 Major depressive disorder, single episode, unspecified: Secondary | ICD-10-CM | POA: Diagnosis not present

## 2013-05-12 DIAGNOSIS — Z471 Aftercare following joint replacement surgery: Secondary | ICD-10-CM | POA: Diagnosis not present

## 2013-05-12 DIAGNOSIS — F339 Major depressive disorder, recurrent, unspecified: Secondary | ICD-10-CM

## 2013-05-12 DIAGNOSIS — G5790 Unspecified mononeuropathy of unspecified lower limb: Secondary | ICD-10-CM | POA: Diagnosis not present

## 2013-05-12 DIAGNOSIS — M1710 Unilateral primary osteoarthritis, unspecified knee: Secondary | ICD-10-CM | POA: Diagnosis not present

## 2013-05-12 DIAGNOSIS — Z96659 Presence of unspecified artificial knee joint: Secondary | ICD-10-CM | POA: Diagnosis not present

## 2013-05-13 NOTE — Telephone Encounter (Signed)
The patient reports he has been having some worsening of anxiety from dilaudid. He reports that he took demerol and had a reaction. He states that he has been having some difficulty sleeping.  He had been taking 100 mg of trazodone. Recommended trying only 50 mg tonight.  Asked him to increase his sertraline to 100 and cautioned about orthostatic hypotension.

## 2013-05-15 ENCOUNTER — Telehealth (HOSPITAL_COMMUNITY): Payer: Self-pay

## 2013-05-15 MED ORDER — TRAZODONE HCL 50 MG PO TABS: ORAL_TABLET | ORAL | Status: DC

## 2013-05-15 NOTE — Telephone Encounter (Signed)
Pt has increased zoloft to 100mg  and has trazadone 1/2 night and doing well. Does need trazadone rx

## 2013-05-15 NOTE — Telephone Encounter (Signed)
Will fill trazodone prescription

## 2013-05-28 DIAGNOSIS — R269 Unspecified abnormalities of gait and mobility: Secondary | ICD-10-CM | POA: Diagnosis not present

## 2013-05-28 DIAGNOSIS — M25569 Pain in unspecified knee: Secondary | ICD-10-CM | POA: Diagnosis not present

## 2013-05-28 DIAGNOSIS — M25669 Stiffness of unspecified knee, not elsewhere classified: Secondary | ICD-10-CM | POA: Diagnosis not present

## 2013-06-02 DIAGNOSIS — M25569 Pain in unspecified knee: Secondary | ICD-10-CM | POA: Diagnosis not present

## 2013-06-02 DIAGNOSIS — M25669 Stiffness of unspecified knee, not elsewhere classified: Secondary | ICD-10-CM | POA: Diagnosis not present

## 2013-06-02 DIAGNOSIS — R269 Unspecified abnormalities of gait and mobility: Secondary | ICD-10-CM | POA: Diagnosis not present

## 2013-06-04 ENCOUNTER — Ambulatory Visit: Payer: Self-pay | Admitting: Internal Medicine

## 2013-06-11 DIAGNOSIS — M25669 Stiffness of unspecified knee, not elsewhere classified: Secondary | ICD-10-CM | POA: Diagnosis not present

## 2013-06-11 DIAGNOSIS — R269 Unspecified abnormalities of gait and mobility: Secondary | ICD-10-CM | POA: Diagnosis not present

## 2013-06-11 DIAGNOSIS — M25569 Pain in unspecified knee: Secondary | ICD-10-CM | POA: Diagnosis not present

## 2013-06-13 DIAGNOSIS — M25569 Pain in unspecified knee: Secondary | ICD-10-CM | POA: Diagnosis not present

## 2013-06-13 DIAGNOSIS — M25669 Stiffness of unspecified knee, not elsewhere classified: Secondary | ICD-10-CM | POA: Diagnosis not present

## 2013-06-13 DIAGNOSIS — R269 Unspecified abnormalities of gait and mobility: Secondary | ICD-10-CM | POA: Diagnosis not present

## 2013-06-16 DIAGNOSIS — M25669 Stiffness of unspecified knee, not elsewhere classified: Secondary | ICD-10-CM | POA: Diagnosis not present

## 2013-06-16 DIAGNOSIS — R269 Unspecified abnormalities of gait and mobility: Secondary | ICD-10-CM | POA: Diagnosis not present

## 2013-06-16 DIAGNOSIS — M25569 Pain in unspecified knee: Secondary | ICD-10-CM | POA: Diagnosis not present

## 2013-06-18 DIAGNOSIS — R269 Unspecified abnormalities of gait and mobility: Secondary | ICD-10-CM | POA: Diagnosis not present

## 2013-06-18 DIAGNOSIS — M25569 Pain in unspecified knee: Secondary | ICD-10-CM | POA: Diagnosis not present

## 2013-06-18 DIAGNOSIS — M25669 Stiffness of unspecified knee, not elsewhere classified: Secondary | ICD-10-CM | POA: Diagnosis not present

## 2013-06-23 ENCOUNTER — Encounter: Payer: Self-pay | Admitting: Internal Medicine

## 2013-06-23 DIAGNOSIS — M25669 Stiffness of unspecified knee, not elsewhere classified: Secondary | ICD-10-CM | POA: Diagnosis not present

## 2013-06-23 DIAGNOSIS — M25569 Pain in unspecified knee: Secondary | ICD-10-CM | POA: Diagnosis not present

## 2013-06-23 DIAGNOSIS — R269 Unspecified abnormalities of gait and mobility: Secondary | ICD-10-CM | POA: Diagnosis not present

## 2013-06-27 ENCOUNTER — Ambulatory Visit (HOSPITAL_COMMUNITY): Payer: Self-pay | Admitting: Psychiatry

## 2013-06-30 ENCOUNTER — Ambulatory Visit: Payer: Medicare Other | Admitting: Internal Medicine

## 2013-07-21 DIAGNOSIS — H2510 Age-related nuclear cataract, unspecified eye: Secondary | ICD-10-CM | POA: Diagnosis not present

## 2013-07-21 DIAGNOSIS — H43399 Other vitreous opacities, unspecified eye: Secondary | ICD-10-CM | POA: Diagnosis not present

## 2013-07-21 DIAGNOSIS — H409 Unspecified glaucoma: Secondary | ICD-10-CM | POA: Diagnosis not present

## 2013-07-21 DIAGNOSIS — H4010X0 Unspecified open-angle glaucoma, stage unspecified: Secondary | ICD-10-CM | POA: Diagnosis not present

## 2013-08-11 ENCOUNTER — Other Ambulatory Visit (HOSPITAL_COMMUNITY): Payer: Self-pay | Admitting: Psychiatry

## 2013-08-12 ENCOUNTER — Other Ambulatory Visit (HOSPITAL_COMMUNITY): Payer: Self-pay | Admitting: Psychiatry

## 2013-08-12 ENCOUNTER — Telehealth: Payer: Self-pay | Admitting: Internal Medicine

## 2013-08-12 ENCOUNTER — Other Ambulatory Visit: Payer: Medicare Other

## 2013-08-12 DIAGNOSIS — R197 Diarrhea, unspecified: Secondary | ICD-10-CM

## 2013-08-12 NOTE — Telephone Encounter (Signed)
Instructed pt to come in for a stool sample. He may take Imodium per the box instructions; he needs to call for fever, blood in stools and abd pain. Pt stated understanding.

## 2013-08-12 NOTE — Telephone Encounter (Signed)
GI pathogen panel Then okay for imodium per box instructions Call for fever, blood in stools, abd pain

## 2013-08-12 NOTE — Telephone Encounter (Signed)
Refill request. Will fill sertraline.

## 2013-08-12 NOTE — Telephone Encounter (Signed)
Pt's last OV 06/05/12; hx of HTN, kidney stones, depression, diverticulitis and GERD. Pt reports he ate at a [place] buffet and has had "diarrhea" for > 3 days; he reports loose stools, not  watery,  and he went>6 times yesterday. He denies pain or cramping with the BMs, no fever and some mild nausea. Informed him he may have a little case of food poisoning. Please advise; stool samples or OK to take Imodium? Thanks.

## 2013-08-13 ENCOUNTER — Telehealth: Payer: Self-pay | Admitting: Internal Medicine

## 2013-08-13 LAB — GASTROINTESTINAL PATHOGEN PANEL PCR
C. difficile Tox A/B, PCR: NEGATIVE
Campylobacter, PCR: NEGATIVE
Cryptosporidium, PCR: NEGATIVE
E coli (ETEC) LT/ST PCR: NEGATIVE
E coli (STEC) stx1/stx2, PCR: NEGATIVE
E coli 0157, PCR: NEGATIVE
Giardia lamblia, PCR: NEGATIVE
Norovirus, PCR: NEGATIVE
Rotavirus A, PCR: NEGATIVE
Salmonella, PCR: NEGATIVE
Shigella, PCR: NEGATIVE

## 2013-08-14 NOTE — Telephone Encounter (Signed)
Informed pt his pathogen panel was negative. He states he has started Imodium and he is  Better.

## 2013-09-08 ENCOUNTER — Other Ambulatory Visit (HOSPITAL_COMMUNITY): Payer: Self-pay | Admitting: Psychiatry

## 2013-09-12 ENCOUNTER — Encounter (HOSPITAL_COMMUNITY): Payer: Self-pay | Admitting: Psychiatry

## 2013-09-12 ENCOUNTER — Ambulatory Visit (INDEPENDENT_AMBULATORY_CARE_PROVIDER_SITE_OTHER): Payer: Medicare Other | Admitting: Psychiatry

## 2013-09-12 ENCOUNTER — Encounter (INDEPENDENT_AMBULATORY_CARE_PROVIDER_SITE_OTHER): Payer: Self-pay

## 2013-09-12 VITALS — BP 119/73 | HR 75 | Wt 183.0 lb

## 2013-09-12 DIAGNOSIS — F331 Major depressive disorder, recurrent, moderate: Secondary | ICD-10-CM

## 2013-09-12 DIAGNOSIS — F339 Major depressive disorder, recurrent, unspecified: Secondary | ICD-10-CM

## 2013-09-12 MED ORDER — SERTRALINE HCL 50 MG PO TABS: 75.0000 mg | ORAL_TABLET | Freq: Every day | ORAL | Status: DC

## 2013-09-12 NOTE — Progress Notes (Addendum)
Nathan [name] Chief Complaint:   HPI Comments: Nathan. [name] is a 62 y/o male with a past psychiatric history significant for Major Depressive Disorder. The patient is referred for psychiatric services for medication management.    . Location: The patient reports a few episodes [name] depression related to his physical disability.   . Quality: The patient reports some frustration about the limitations to physical activity due to a left shoulder injury which occurred 6 years ago at work.  He continues to go to school. His marital problems have resolved, though he still harbors some resentment. He is still waiting for his case against his employer to settle.  He reports he is taking his medications and denies any side effects.   In the area [name] affective symptoms, patient appears euthymic. Patient denies current suicidal ideation, intent, or plan. Patient denies current homicidal ideation, intent, or plan. Patient denies auditory hallucinations. Patient denies visual hallucinations. Patient denies symptoms [name] paranoia. Patient states sleep is good. Appetite is good. Energy level is fair. Patient denies symptoms [name] anhedonia. Patient denies hopelessness, helplessness, or guilt.   . Severity: Depression: 7/10 (0=Very depressed; 5=Neutral; 10=Very Happy)  Anxiety- 3/10 (0=no anxiety; 5= moderate/tolerable anxiety; 10= panic attacks)   . Duration: Patient reports he has suffered from depression for the past 6 years.  . Timing: Mood is worse in the evening. Waxing and waning throughout the years.  . Context: Ongoing court case.  . Modifying factors: Mood improves with spending time with family.  . Associated signs and symptoms: Denies any recent episodes const with mania, particularly decreased need for sleep with increased energy, grandiosity, impulsivity, hyperverbal and pressured speech, or  increased productivity. Denies any recent symptoms consistent with psychosis, particularly auditory or visual hallucinations, thought broadcasting/insertion/withdrawal, or ideas [name] reference. Also denies excessive worry to the point [name] physical symptoms as well as any panic attacks. Denies any history [name] trauma or symptoms consistent with PTSD such as flashbacks, nightmares, hypervigilance, feelings [name] numbness or inability to connect with others.   Review [name] Systems  Constitutional: Negative for fever, chills and weight loss.  Cardiovascular: Negative for chest pain, palpitations and leg swelling.  Gastrointestinal: Negative for heartburn, nausea, vomiting and abdominal pain.  Musculoskeletal: Positive for joint pain (Right knee and left shoulder.) and neck pain.  Neurological: Negative for dizziness, tremors, focal weakness and seizures.      Filed Vitals:   09/12/13 1501  BP: 119/73  Pulse: 75  Weight: 183 lb (83.008 kg)   Physical Exam  Vitals reviewed.  Constitutional: He appears well-developed and well-nourished. No distress.  Skin: He is not diaphoretic.  Musculoskeletal: Strength & Muscle Tone: within normal limits Gait & Station: normal Patient leans: N/A   Traumatic Brain Injury: No   Past Psychiatric History: Reviewed  Diagnosis: Major Depression Disorder.   Hospitalizations: Patient denies.   Outpatient Care: Patient denies.   Substance Abuse Care: Patient denies.   Self-Mutilation:Patient denies.   Suicidal Attempts: Patient denies.   Violent Behaviors: *Patient denies.    Past Medical History: Reviewed  Past Medical History  Diagnosis Date  . Depression   . Hypertension   . Renal disorder   . Renal calculi   . Ligament tear [name] upper extremity April 2008    Left elbow  . Diverticulitis 2013  . CRPS (complex regional pain syndrome), upper limb   . Nephrolithiasis     History [name]  Loss [name] Consciousness: Nathan [name] a panic attack in 1988  Seizure History: No   Cardiac History: No   Allergies: Reviewed  Allergies  Allergen Reactions  . Adhesive [Tape]     Hives on area wear tape applied  . Hydrocodone-Acetaminophen Itching   Current Medications: Reviewed  Current Outpatient Prescriptions on File Prior to Visit  Medication Sig Dispense Refill  . esomeprazole (NEXIUM) 40 MG capsule Take 1 capsule (40 mg total) by mouth daily before breakfast.  30 capsule  3  . Gabapentin, PHN, 300 MG TABS Take 1,200 mg by mouth daily.      [name] LUMIGAN 0.01 % SOLN       . naproxen (NAPROSYN) 250 MG tablet Take 250 mg by mouth 2 (two) times daily with a meal.      . sertraline (ZOLOFT) 50 MG tablet TAKE 1.5 TABLETS (75 MG TOTAL) BY MOUTH DAILY.  45 tablet  2   No current facility-administered medications on file prior to visit.    Previous Psychotropic Medications: Reviewed  Medication  Dose   Sertraline  50-100 mg   Clonazepam  0.25 to 1 mg    Substance Abuse History in the last 12 months: Reviewed  Caffeine: Coffee 1 cup per day. Caffeinated Beverages 8-16 ounces per day.  Nicotine:Patient denies.  Alcohol: Patient reports periodic use-1-2 cups per day Illicit Drugs: Patient denies.   Social History: Reviewed  Current Place [name] Residence: [place], [place]  Place [name] Birth:[place]  Family Members: Patient lives with his wife and youngest son. Marital Status: Married Children: 4  Sons: 2 Daughters: 2 Relationships: Has a close relationship with his sons Education: Financial planner Problems/Performance: Did well in school  Religious Beliefs/Practices: Prays.  History [name] Abuse: none  Occupational Experiences: Retail banker, currently on disability Military History: None.  Legal History: Currently awaiting decision on disability Hobbies/Interests: Watching sports, painter   Family History: Reviewed  Family History  Problem Relation Age [name] Onset  . Lung cancer Father   . Kidney disease Mother   . Colon cancer Neg Hx     Psychiatric  specialty examination:  Objective: Appearance: Casual and Well Groomed   Eye Contact:: Good   Speech: Clear and Coherent and Normal Rate   Volume: Normal   Mood: "Okay"   Affect: Appropriate and Congruent   Thought Process: Coherent, Logical and Loose   Orientation: Full   Thought Content: WDL   Suicidal Thoughts: No   Homicidal Thoughts: No   Judgement: Fair   Insight: Fair   Psychomotor Activity: Normal   Akathisia: No   Handed: Right   Memory: Immediate 3/3, recent 3/3   Concentration: Good   AIMS (if indicated): Not Indicated   Assets: Merchant navy officer  Housing  Social Support    Assessment:  AXIS I  Major Depressive Disorder, recurrent, moderate   Treatment Plan/Recommendations:  Plan [name] Care:  PLAN:  1. Affirm with the patient that the medications are taken as ordered. Patient  expressed understanding [name] how their medications were to be used.    Laboratory:  No labs warranted at this time.    Psychotherapy: Therapy: brief supportive therapy provided.  Discussed psychosocial stressors in detail.    Medications:  Continue  the following psychiatric medications as written prior to this appointment with the following changes::  a) Continue Sertraline 75 mg  -Risks and benefits, side effects and alternatives discussed with patient, he was given an opportunity to ask questions about his medication, illness,  and treatment. All current psychiatric medications have been reviewed and discussed with the patient and adjusted as clinically appropriate. The patient has been provided an accurate and updated list [name] the medications being now prescribed.   Routine PRN Medications:  Negative  Consultations: The patient was encouraged to keep all PCP and specialty clinic appointments.   Safety Concerns:   Patient told to call clinic if any problems occur. Patient advised to go to  ER  if he should develop SI/HI, side effects, or if symptoms worsen.  Has crisis numbers to call if needed.    Other:   8. Patient was instructed to return to clinic in 4 months.  9. The patient was advised to call and cancel their mental health appointment within 24 hours [name] the appointment, if they are unable to keep the appointment, as well as the three no show and termination from clinic policy. 10. The patient expressed understanding [name] the plan and agrees with the above.  Time Spent: 25 minutes  Jacqulyn Cane, M.D.  09/12/2013 2:49 PM

## 2013-10-14 DIAGNOSIS — H2510 Age-related nuclear cataract, unspecified eye: Secondary | ICD-10-CM | POA: Diagnosis not present

## 2013-10-14 DIAGNOSIS — H4010X0 Unspecified open-angle glaucoma, stage unspecified: Secondary | ICD-10-CM | POA: Diagnosis not present

## 2013-10-14 DIAGNOSIS — H43399 Other vitreous opacities, unspecified eye: Secondary | ICD-10-CM | POA: Diagnosis not present

## 2013-10-14 DIAGNOSIS — H409 Unspecified glaucoma: Secondary | ICD-10-CM | POA: Diagnosis not present

## 2013-10-24 DIAGNOSIS — M1710 Unilateral primary osteoarthritis, unspecified knee: Secondary | ICD-10-CM | POA: Diagnosis not present

## 2013-10-30 ENCOUNTER — Ambulatory Visit (INDEPENDENT_AMBULATORY_CARE_PROVIDER_SITE_OTHER): Payer: Medicare Other | Admitting: Physician Assistant

## 2013-10-30 ENCOUNTER — Encounter: Payer: Self-pay | Admitting: Physician Assistant

## 2013-10-30 VITALS — BP 122/80 | HR 86 | Ht 65.0 in | Wt 186.2 lb

## 2013-10-30 DIAGNOSIS — K219 Gastro-esophageal reflux disease without esophagitis: Secondary | ICD-10-CM | POA: Diagnosis not present

## 2013-10-30 DIAGNOSIS — R1013 Epigastric pain: Secondary | ICD-10-CM | POA: Diagnosis not present

## 2013-10-30 MED ORDER — DEXLANSOPRAZOLE 60 MG PO CPDR: 60.0000 mg | DELAYED_RELEASE_CAPSULE | Freq: Every day | ORAL | Status: DC

## 2013-10-30 NOTE — Patient Instructions (Signed)
You have been scheduled for an endoscopy with propofol. Please follow written instructions given to you at your visit today. If you use inhalers (even only as needed), please bring them with you on the day of your procedure.  We have given  You samples of Dexilant 60 mg. Take 1 in the am instead of Nexium 40 mg.

## 2013-10-30 NOTE — Progress Notes (Addendum)
Subjective:    Patient ID: Nathan [name], male    DOB: 1951-07-07, 63 y.o.   MRN: [id number]  HPI  Nathan [name] is a pleasant 63 year old male known to Dr. Hilarie Fredrickson with history of recurrent diverticulitis, chronic GERD and depression. He also has fatty liver disease and hypertension. Patient was last seen in the office in 2013. He has been on Nexium 40 mg by mouth daily over the past couple of years. He has not had prior endoscopy. Screening colonoscopy was done in 2011 at [facility] gastroenterology he had 2 small polyps removed from the right colon presumed adenomas and left colon diverticulosis plan is for five-year interval followup. Comes in the office today with complaints of heartburn and indigestion as well as epigastric pain. He says recently he does not feel his Nexium is working as well and he is having almost daily symptoms with heartburn indigestion after meals. He also has some burning epigastric discomfort. He says he is bothered after meals and at night in the late evening since. He says he can't sleep until he can get heartburn under control and has been using Alka-Seltzer at nighttime. He denies any dysphagia or  Odynophagia. His appetite has been good, his weight has actually gone up. No  lower GI complaints. He has not been using any regular aspirin or NSAIDs no other new meds.    Review of Systems  Constitutional: Negative.   HENT: Negative.   Eyes: Negative.   Respiratory: Negative.   Cardiovascular: Negative.   Gastrointestinal: Positive for abdominal pain.  Endocrine: Negative.   Genitourinary: Negative.   Musculoskeletal: Negative.   Allergic/Immunologic: Negative.   Neurological: Negative.   Hematological: Negative.   Psychiatric/Behavioral: Negative.    Outpatient Prescriptions Prior to Visit  Medication Sig Dispense Refill  . esomeprazole (NEXIUM) 40 MG capsule Take 1 capsule (40 mg total) by mouth daily before breakfast.  30 capsule  3  . Gabapentin, PHN, 300 MG  TABS Take 1,200 mg by mouth daily.      [name] LUMIGAN 0.01 % SOLN       . naproxen (NAPROSYN) 250 MG tablet Take 250 mg by mouth 2 (two) times daily with a meal.      . sertraline (ZOLOFT) 50 MG tablet Take 1.5 tablets (75 mg total) by mouth daily.  45 tablet  4  . timolol (TIMOPTIC) 0.5 % ophthalmic solution        No facility-administered medications prior to visit.   Allergies  Allergen Reactions  . Adhesive [Tape]     Hives on area wear tape applied  . Hydrocodone-Acetaminophen Itching   Patient Active Problem List   Diagnosis Date Noted  . Major depressive disorder, recurrent 04/12/2012  . CAUSALGIA OF UPPER LIMB 08/02/2010  . HYPERTENSION, BENIGN, MILD 08/02/2010  . ANEMIA 05/11/2010  . NEUROPATHY 08/10/2009  . NEPHROLITHIASIS 05/22/2009  . GERD 06/23/2008  . ALLERGIC RHINITIS 01/14/2008  . HYPERLIPIDEMIA NEC/NOS 06/28/2007  . FATTY LIVER DISEASE 06/28/2007  . DISEASE, CYSTIC KIDNEY NOS, CONGENITAL 06/28/2007  . MDD (major depressive disorder) 06/17/2007   History  Substance Use Topics  . Smoking status: Former Smoker -- 1.00 packs/day for 10 years    Types: Cigarettes    Quit date: 09/25/1981  . Smokeless tobacco: Never Used  . Alcohol Use: No     Comment: Last drink, yesterday   family history includes Kidney disease in his mother; Lung cancer in his father. There is no history of Colon cancer.     Objective:  Physical Exam Well-developed older male in no acute distress, pleasant blood pressure 122/80 pulse 86 height 5 foot 5 weight 186. HEENT; nontraumatic normocephalic EOMI PERRLA sclera anicteric, Supple ;no JVD, Cardiovascular; regular rate and rhythm with S1-S2 no murmur rub or gallop, Pulmonary; clear bilaterally, Abdomen ;soft basically nontender there is no palpable mass or hepatosplenomegaly bowel sounds are active, Rectal ;exam not done, Extremities; no clubbing cyanosis or edema skin warm and dry, Psych; mood and affect appropriate       Assessment &  Plan:  #71  63 year old male with history of chronic GERD now refractory to Nexium 40 mg every morning. No prior EGD #2 history of present adenomatous colon polyps plan for followup colonoscopy 2016 #3 fatty liver disease #4 depression #5 hypertension  Plan Will switch him to a trial of Dexilant  60 mg by mouth every morning samples were given today for 2 weeks worth -if this is helpful Will send prescription Have reviewed a strict antireflux regimen with the patient including n.p.o. for 2-3 hours before bedtime and elevation of the head of the bed Schedule for upper endoscopy with Dr.Pyrtle-rule out Barrett's, esophagitis or gastropathy- patient has not had prior EGD. Procedure discussed in detail with the patient and he is agreeable to proceed.  Addendum: Reviewed and agree with initial management. Jerene Bears, MD

## 2013-11-14 DIAGNOSIS — H409 Unspecified glaucoma: Secondary | ICD-10-CM | POA: Diagnosis not present

## 2013-11-14 DIAGNOSIS — H43399 Other vitreous opacities, unspecified eye: Secondary | ICD-10-CM | POA: Diagnosis not present

## 2013-11-14 DIAGNOSIS — H2510 Age-related nuclear cataract, unspecified eye: Secondary | ICD-10-CM | POA: Diagnosis not present

## 2013-11-14 DIAGNOSIS — H4010X0 Unspecified open-angle glaucoma, stage unspecified: Secondary | ICD-10-CM | POA: Diagnosis not present

## 2013-11-19 ENCOUNTER — Ambulatory Visit: Payer: Self-pay | Admitting: Family Medicine

## 2013-11-19 ENCOUNTER — Encounter: Payer: Self-pay | Admitting: Emergency Medicine

## 2013-11-19 ENCOUNTER — Emergency Department (INDEPENDENT_AMBULATORY_CARE_PROVIDER_SITE_OTHER)
Admission: EM | Admit: 2013-11-19 | Discharge: 2013-11-19 | Discharge status: Absent without leave | Payer: Medicare Other | Source: Home / Self Care | Attending: Family Medicine | Admitting: Family Medicine

## 2013-11-19 DIAGNOSIS — T169XXA Foreign body in ear, unspecified ear, initial encounter: Secondary | ICD-10-CM | POA: Diagnosis not present

## 2013-11-19 DIAGNOSIS — H6120 Impacted cerumen, unspecified ear: Secondary | ICD-10-CM | POA: Diagnosis not present

## 2013-11-19 DIAGNOSIS — T162XXA Foreign body in left ear, initial encounter: Secondary | ICD-10-CM

## 2013-11-19 NOTE — ED Provider Notes (Signed)
CSN: [id number]     Arrival date & time 11/19/13  1057 History   First MD Initiated Contact with Patient 11/19/13 1127     Chief Complaint  Patient presents with  . Otalgia        HPI Comments: Patient complains of pressure-like left earache.  He had chills last night. He reports that he uses ear-bud type headphones that have white soft plastic ear pieces.  Patient is a 63 y.o. male presenting with ear pain. The history is provided by the patient.  Otalgia Location:  Left Behind ear:  No abnormality Quality:  Pressure Severity:  Mild Onset quality:  Gradual Duration:  1 day Timing:  Constant Progression:  Worsening Chronicity:  New Relieved by:  Nothing Worsened by:  Nothing tried Ineffective treatments:  None tried Associated symptoms: hearing loss   Associated symptoms: no congestion, no cough, no ear discharge, no fever, no headaches, no neck pain, no rash, no rhinorrhea and no sore throat     Past Medical History  Diagnosis Date  . Depression   . Hypertension   . Renal disorder   . Renal calculi   . Ligament tear of upper extremity April 2008    Left elbow  . Diverticulitis 2013  . CRPS (complex regional pain syndrome), upper limb   . Nephrolithiasis    Past Surgical History  Procedure Laterality Date  . Rotary cuff repair      left  . Ulnar nerve repair    . Replacement total knee     Family History  Problem Relation Age of Onset  . Lung cancer Father   . Kidney disease Mother   . Colon cancer Neg Hx    History  Substance Use Topics  . Smoking status: Former Smoker -- 1.00 packs/day for 10 years    Types: Cigarettes    Quit date: 09/25/1981  . Smokeless tobacco: Never Used  . Alcohol Use: 0.0 oz/week    0 Cans of beer per week     Comment: 1-2 per wk    Review of Systems  Constitutional: Negative for fever.  HENT: Positive for ear pain and hearing loss. Negative for congestion, ear discharge, rhinorrhea and sore throat.   Respiratory: Negative  for cough.   Musculoskeletal: Negative for neck pain.  Skin: Negative for rash.  Neurological: Negative for headaches.      Allergies  Adhesive and Hydrocodone-acetaminophen  Home Medications   Current Outpatient Rx  Name  Route  Sig  Dispense  Refill  . brimonidine (ALPHAGAN P) 0.1 % SOLN      1 drop.         [name] dexlansoprazole (DEXILANT) 60 MG capsule   Oral   Take 1 capsule (60 mg total) by mouth daily.   30 capsule   0     Lot # [id number] Exp date: 03/2016   . esomeprazole (NEXIUM) 40 MG capsule   Oral   Take 1 capsule (40 mg total) by mouth daily before breakfast.   30 capsule   3   . Gabapentin, PHN, 300 MG TABS   Oral   Take 1,200 mg by mouth daily.         [name] LUMIGAN 0.01 % SOLN               . naproxen (NAPROSYN) 250 MG tablet   Oral   Take 250 mg by mouth 2 (two) times daily with a meal.         .  sertraline (ZOLOFT) 50 MG tablet   Oral   Take 1.5 tablets (75 mg total) by mouth daily.   45 tablet   4    BP 149/86  Pulse 76  Temp(Src) 98.1 F (36.7 C) (Oral)  Resp 18  Ht 5\' 6"  (1.676 m)  Wt 184 lb (83.462 kg)  BMI 29.71 kg/m2  SpO2 97% Physical Exam Nursing notes and Vital Signs reviewed. Appearance:  Patient appears healthy, stated age, and in no acute distress Eyes:  Pupils are equal, round, and reactive to light and accomodation.  Extraocular movement is intact.  Conjunctivae are not inflamed  Ears:  Right canal and tympanic membrane normal.  Left canal has a white plastic object lodged in mid-canal Nose:   Normal turbinates.  No sinus tenderness.    Pharynx:  Normal Neck:  Supple.  No adenopathy     ED Course  Procedures  none       MDM   Final diagnoses:  Foreign body of ear, left:  Headphone plastic ear piece, unable to safely remove   Patient referred to ENT for removal this morning    Kandra Nicolas, MD 11/21/13 [phone]

## 2013-11-19 NOTE — ED Notes (Signed)
Pt c/o LT ear pain with some chills x last night. Denies fever.

## 2013-11-28 ENCOUNTER — Ambulatory Visit (AMBULATORY_SURGERY_CENTER): Payer: Medicare Other | Admitting: Internal Medicine

## 2013-11-28 ENCOUNTER — Encounter: Payer: Self-pay | Admitting: Internal Medicine

## 2013-11-28 VITALS — BP 121/88 | HR 68 | Temp 98.5°F | Resp 17 | Ht 65.0 in | Wt 186.0 lb

## 2013-11-28 DIAGNOSIS — K2970 Gastritis, unspecified, without bleeding: Secondary | ICD-10-CM | POA: Diagnosis not present

## 2013-11-28 DIAGNOSIS — D649 Anemia, unspecified: Secondary | ICD-10-CM | POA: Diagnosis not present

## 2013-11-28 DIAGNOSIS — R1013 Epigastric pain: Secondary | ICD-10-CM | POA: Diagnosis not present

## 2013-11-28 DIAGNOSIS — G589 Mononeuropathy, unspecified: Secondary | ICD-10-CM | POA: Diagnosis not present

## 2013-11-28 DIAGNOSIS — F329 Major depressive disorder, single episode, unspecified: Secondary | ICD-10-CM | POA: Diagnosis not present

## 2013-11-28 DIAGNOSIS — K2990 Gastroduodenitis, unspecified, without bleeding: Secondary | ICD-10-CM

## 2013-11-28 DIAGNOSIS — K700 Alcoholic fatty liver: Secondary | ICD-10-CM | POA: Diagnosis not present

## 2013-11-28 DIAGNOSIS — K219 Gastro-esophageal reflux disease without esophagitis: Secondary | ICD-10-CM | POA: Diagnosis not present

## 2013-11-28 DIAGNOSIS — I10 Essential (primary) hypertension: Secondary | ICD-10-CM | POA: Diagnosis not present

## 2013-11-28 DIAGNOSIS — F3289 Other specified depressive episodes: Secondary | ICD-10-CM | POA: Diagnosis not present

## 2013-11-28 MED ORDER — SODIUM CHLORIDE 0.9 % IV SOLN: 500.0000 mL | INTRAVENOUS | Status: DC

## 2013-11-28 NOTE — Progress Notes (Signed)
Stable to RR 

## 2013-11-28 NOTE — Patient Instructions (Signed)
YOU HAD AN ENDOSCOPIC PROCEDURE TODAY AT [facility]: Refer to the procedure report that was given to you for any specific questions about what was found during the examination.  If the procedure report does not answer your questions, please call your gastroenterologist to clarify.  If you requested that your care partner not be given the details of your procedure findings, then the procedure report has been included in a sealed envelope for you to review at your convenience later.  YOU SHOULD EXPECT: Some feelings of bloating in the abdomen. Passage of more gas than usual.  Walking can help get rid of the air that was put into your GI tract during the procedure and reduce the bloating. If you had a lower endoscopy (such as a colonoscopy or flexible sigmoidoscopy) you may notice spotting of blood in your stool or on the toilet paper. If you underwent a bowel prep for your procedure, then you may not have a normal bowel movement for a few days.  DIET: Your first meal following the procedure should be a light meal and then it is ok to progress to your normal diet.  A half-sandwich or bowl of soup is an example of a good first meal.  Heavy or fried foods are harder to digest and may make you feel nauseous or bloated.  Likewise meals heavy in dairy and vegetables can cause extra gas to form and this can also increase the bloating.  Drink plenty of fluids but you should avoid alcoholic beverages for 24 hours.  ACTIVITY: Your care partner should take you home directly after the procedure.  You should plan to take it easy, moving slowly for the rest of the day.  You can resume normal activity the day after the procedure however you should NOT DRIVE or use heavy machinery for 24 hours (because of the sedation medicines used during the test).    SYMPTOMS TO REPORT IMMEDIATELY: A gastroenterologist can be reached at any hour.  During normal business hours, 8:30 AM to 5:00 PM Monday through Friday,  call [phone].  After hours and on weekends, please call the GI answering service at [phone] who will take a message and have the physician on call contact you.     Following upper endoscopy (EGD)  Vomiting of blood or coffee ground material  New chest pain or pain under the shoulder blades  Painful or persistently difficult swallowing  New shortness of breath  Fever of 100F or higher  Black, tarry-looking stools  FOLLOW UP: If any biopsies were taken you will be contacted by phone or by letter within the next 1-3 weeks.  Call your gastroenterologist if you have not heard about the biopsies in 3 weeks.  Our staff will call the home number listed on your records the next business day following your procedure to check on you and address any questions or concerns that you may have at that time regarding the information given to you following your procedure. This is a courtesy call and so if there is no answer at the home number and we have not heard from you through the emergency physician on call, we will assume that you have returned to your regular daily activities without incident.   Await biopsy results.  Will treat for H pylori if needed.  Continue Nexium 40mg . Daily and addd famotidine 20 mg at bedtime if needed for breakthrough heartburn syptoms.  SIGNATURES/CONFIDENTIALITY: You and/or your care partner have signed paperwork which will  be entered into your electronic medical record.  These signatures attest to the fact that that the information above on your After Visit Summary has been reviewed and is understood.  Full responsibility of the confidentiality of this discharge information lies with you and/or your care-partner. 

## 2013-11-28 NOTE — Progress Notes (Signed)
Called to room to assist during endoscopic procedure.  Patient ID and intended procedure confirmed with present staff. Received instructions for my participation in the procedure from the performing physician.  

## 2013-11-28 NOTE — Op Note (Signed)
[facility]  [address] [place], [phone]   ENDOSCOPY PROCEDURE REPORT  PATIENT: Nathan, [name]  MR#: [id number] BIRTHDATE: August 15, 1951 , 34  yrs. old GENDER: Male ENDOSCOPIST: Jerene Bears, MD PROCEDURE DATE:  11/28/2013 PROCEDURE:  EGD w/ biopsy ASA CLASS:     Class II INDICATIONS:  History of esophageal reflux.   Epigastric pain. MEDICATIONS: MAC sedation, administered by CRNA and Propofol (Diprivan) 60 mg IV TOPICAL ANESTHETIC: none  DESCRIPTION OF PROCEDURE: After the risks benefits and alternatives of the procedure were thoroughly explained, informed consent was obtained.  The LB [id number] endoscope was introduced through the mouth and advanced to the second portion of the duodenum. Without limitations.  The instrument was slowly withdrawn as the mucosa was fully examined.     ESOPHAGUS: The mucosa of the esophagus appeared normal.   Z-line slightly variable at 39 cm, but no evidence of Barrett's change  STOMACH: There was mild antral gastropathy noted.  Cold forcep biopsies were taken at the antrum and angularis.  Otherwise normal  DUODENUM: The duodenal mucosa showed no abnormalities in the bulb and second portion of the duodenum. Retroflexed views revealed no abnormalities.     The scope was then withdrawn from the patient and the procedure completed.  COMPLICATIONS: There were no complications.  ENDOSCOPIC IMPRESSION: 1.   The mucosa of the esophagus appeared normal 2.   There was mild antral gastropathy noted; biopsies from gastric body, antrum and incisura 3.   The duodenal mucosa showed no abnormalities in the bulb and second portion of the duodenum  RECOMMENDATIONS: 1.  Await biopsy results 2.  Follow-up of helicobacter pylori status, treat if indicated 3.  Can continue Nexium 40 mg daily and add famotidine 20 mg at bedtime as needed for breakthrough heartburn symptoms.  eSigned:  Jerene Bears, MD 11/28/2013 9:46  AM CC:The Patient and Beatrice Lecher, MD

## 2013-12-01 ENCOUNTER — Telehealth: Payer: Self-pay

## 2013-12-01 NOTE — Telephone Encounter (Signed)
Left message on answering machine. 

## 2013-12-09 ENCOUNTER — Telehealth: Payer: Self-pay | Admitting: Internal Medicine

## 2013-12-10 ENCOUNTER — Encounter: Payer: Self-pay | Admitting: Internal Medicine

## 2013-12-10 NOTE — Telephone Encounter (Signed)
Patient advised that we will send a letter or call when Dr. Hilarie Fredrickson reviews

## 2013-12-22 ENCOUNTER — Telehealth: Payer: Self-pay | Admitting: Internal Medicine

## 2013-12-22 NOTE — Telephone Encounter (Signed)
Questions answered.  He would like to come in and see Dr. Hilarie Fredrickson to discuss further.  He is scheduled for 02/09/14

## 2014-01-12 ENCOUNTER — Ambulatory Visit (HOSPITAL_COMMUNITY): Payer: Self-pay | Admitting: Psychiatry

## 2014-01-22 ENCOUNTER — Encounter: Payer: Self-pay | Admitting: Family Medicine

## 2014-01-22 DIAGNOSIS — Z96659 Presence of unspecified artificial knee joint: Secondary | ICD-10-CM | POA: Diagnosis not present

## 2014-01-22 DIAGNOSIS — Z471 Aftercare following joint replacement surgery: Secondary | ICD-10-CM | POA: Diagnosis not present

## 2014-01-29 ENCOUNTER — Encounter: Payer: Self-pay | Admitting: Family Medicine

## 2014-02-05 ENCOUNTER — Encounter: Payer: Self-pay | Admitting: Internal Medicine

## 2014-02-06 ENCOUNTER — Ambulatory Visit (HOSPITAL_COMMUNITY): Payer: Self-pay | Admitting: Psychiatry

## 2014-02-06 DIAGNOSIS — G894 Chronic pain syndrome: Secondary | ICD-10-CM | POA: Insufficient documentation

## 2014-02-06 DIAGNOSIS — M79609 Pain in unspecified limb: Secondary | ICD-10-CM | POA: Insufficient documentation

## 2014-02-06 DIAGNOSIS — M67911 Unspecified disorder of synovium and tendon, right shoulder: Secondary | ICD-10-CM | POA: Insufficient documentation

## 2014-02-06 DIAGNOSIS — M25519 Pain in unspecified shoulder: Secondary | ICD-10-CM | POA: Insufficient documentation

## 2014-02-09 ENCOUNTER — Encounter: Payer: Self-pay | Admitting: Internal Medicine

## 2014-02-09 ENCOUNTER — Ambulatory Visit (INDEPENDENT_AMBULATORY_CARE_PROVIDER_SITE_OTHER): Payer: Medicare Other | Admitting: Internal Medicine

## 2014-02-09 VITALS — BP 116/74 | HR 84 | Ht 65.75 in | Wt 184.2 lb

## 2014-02-09 DIAGNOSIS — K319 Disease of stomach and duodenum, unspecified: Secondary | ICD-10-CM

## 2014-02-09 DIAGNOSIS — K31A0 Gastric intestinal metaplasia, unspecified: Secondary | ICD-10-CM

## 2014-02-09 DIAGNOSIS — Z860100 Personal history of colon polyps, unspecified: Secondary | ICD-10-CM

## 2014-02-09 DIAGNOSIS — K219 Gastro-esophageal reflux disease without esophagitis: Secondary | ICD-10-CM

## 2014-02-09 DIAGNOSIS — K3189 Other diseases of stomach and duodenum: Secondary | ICD-10-CM

## 2014-02-09 MED ORDER — FAMOTIDINE 20 MG PO TABS: 20.0000 mg | ORAL_TABLET | Freq: Two times a day (BID) | ORAL | Status: DC

## 2014-02-09 NOTE — Progress Notes (Signed)
   Subjective:    Patient ID: Nathan [name], male    DOB: 11-26-1950, 63 y.o.   MRN: [id number]  HPI Nathan [name] is a 63 yo male with PMH of diverticulitis, GERD, and depression who is seen for followup. He was seen by Nicoletta Ba, PA-C in February and then schedule for upper endoscopy because he was having heartburn and indigestion along with epigastric pain. He was given Dexilant for 2 weeks and then resume Nexium 40 mg daily. He did have upper endoscopy on 11/28/2013 which revealed mild antral gastropathy which was biopsied. The esophagus and examined portions of the duodenum were normal. Biopsy showed benign gastric mucosa with mild chronic gastritis. There was focal intestinal metaplasia without dysplasia. No H. pylori. The patient does report a previous history of H. pylori for which he was treated.  He is feeling well today he has stopped Nexium. He is using famotidine on occasion in the evenings for heartburn. Otherwise he denies abdominal pain. No trouble swallowing. No nausea or vomiting. Normal bowel habits without blood in his stool or melena. He rarely drinks alcohol. No new medicines. No NSAIDs.  He did receive pathology letter in the mail describing metaplasia and he wanted to discuss this further    Review of Systems As per history of present illness, otherwise negative  Current Medications, Allergies, Past Medical History, Past Surgical History, Family History and Social History were reviewed in [employer] [employer] record.     Objective:   Physical Exam BP 116/74  Pulse 84  Ht 5' 5.75" (1.67 m)  Wt 184 lb 4 oz (83.575 kg)  BMI 29.97 kg/m2 Constitutional: Well-developed and well-nourished. No distress. HEENT: Normocephalic and atraumatic. Oropharynx is clear and moist. No oropharyngeal exudate. Conjunctivae are normal.  No scleral icterus. Cardiovascular: Normal rate, regular rhythm and intact distal pulses. No M/R/G Pulmonary/chest: Effort normal  and breath sounds normal. No wheezing, rales or rhonchi. Abdominal: Soft, nontender, nondistended. Bowel sounds active throughout.  Extremities: no clubbing, cyanosis, or edema Neurological: Alert and oriented to person place and time. Psychiatric: Normal mood and affect. Behavior is normal.  Records reviewed he had a colonoscopy in 2011 small, subcentimeter, tubular adenoma      Assessment & Plan:  63 yo male with PMH of diverticulitis, GERD, and depression who is seen for followup  1.  Gastric metaplasia -- he has a history of H. pylori which was previously treated and there is no evidence of ongoing H. pylori by recent gastric biopsy. We discussed gastric metaplasia and how very rarely this can be a precursor for malignancy. He does not have a family history of gastric cancer. I have recommended repeat upper endoscopy in one year for repeat biopsies to ensure no progression of gastric metaplasia. I think he is low risk for gastric cancer overall and there was no evidence for atrophic gastritis. If biopsies are stable and we will likely not continue surveillance endoscopy given his overall low risk. He understands this recommendation and agrees with the plan.  2. GERD -- intermittent and he can continue famotidine on as-needed basis per box instruction  3.  History of adenomatous colon -- he is due repeat surveillance colonoscopy in 2016. We will plan to perform the upper endoscopy as discussed in #1 on the same day as the colonoscopy. He is agreeable to this plan

## 2014-02-09 NOTE — Patient Instructions (Signed)
Follow up as needed you have a recall colonoscopy and endoscopy for march 2016, we will send you our a reminder letter once the date approaches

## 2014-02-13 ENCOUNTER — Ambulatory Visit (INDEPENDENT_AMBULATORY_CARE_PROVIDER_SITE_OTHER): Payer: Medicare Other | Admitting: Psychiatry

## 2014-02-13 DIAGNOSIS — F331 Major depressive disorder, recurrent, moderate: Secondary | ICD-10-CM | POA: Diagnosis not present

## 2014-02-13 DIAGNOSIS — F339 Major depressive disorder, recurrent, unspecified: Secondary | ICD-10-CM

## 2014-02-13 DIAGNOSIS — H409 Unspecified glaucoma: Secondary | ICD-10-CM | POA: Diagnosis not present

## 2014-02-13 DIAGNOSIS — H4010X0 Unspecified open-angle glaucoma, stage unspecified: Secondary | ICD-10-CM | POA: Diagnosis not present

## 2014-02-13 MED ORDER — SERTRALINE HCL 50 MG PO TABS: 75.0000 mg | ORAL_TABLET | Freq: Every day | ORAL | Status: DC

## 2014-02-13 NOTE — Progress Notes (Signed)
Patient ID: Nathan [name], male   DOB: 05/01/1951, 63 y.o.   MRN: [id number]   [place] Follow-up Outpatient Visit  Nathan [name] 12-22-1950  Date: 09/12/2013  History of Chief Complaint:   HPI Comments: Mr. Dulworth is a 63 y/o male with a past psychiatric history significant for Major Depressive Disorder. The patient is referred for psychiatric services for medication management.   Continue to take meds and is working. Some concern of anxiety at times. Not on klonopine any more. . Location: The patient reports a few episodes of depression related to his physical disability.   . Quality: The patient reports some frustration about the limitations to physical activity due to a left shoulder injury which occurred 6 years ago at work.  He continues to go to school. His marital problems have resolved, though he still harbors some resentment. He is still waiting for his case against his employer to settle.  He reports he is taking his medications and denies any side effects.   In the area of affective symptoms, patient appears euthymic. Patient denies current suicidal ideation, intent, or plan. Patient denies current homicidal ideation, intent, or plan. Patient denies auditory hallucinations. Patient denies visual hallucinations. Patient denies symptoms of paranoia. Patient states sleep is good. Appetite is good. Energy level is fair. Patient denies symptoms of anhedonia. Patient denies hopelessness, helplessness, or guilt.   . Severity: Depression: 7/10 (0=Very depressed; 5=Neutral; 10=Very Happy)  Anxiety- 3/10 (0=no anxiety; 5= moderate/tolerable anxiety; 10= panic attacks)   . Duration: Patient reports he has suffered from depression for the past 6 years.  . Timing: Mood is worse in the evening. Waxing and waning throughout the years.  . Context: Ongoing court case.  . Modifying factors: Mood improves with spending time with family.  . Associated signs and  symptoms: Denies any recent episodes const with mania, particularly decreased need for sleep with increased energy, grandiosity, impulsivity, hyperverbal and pressured speech, or increased productivity. Denies any recent symptoms consistent with psychosis, particularly auditory or visual hallucinations, thought broadcasting/insertion/withdrawal, or ideas of reference. Also denies excessive worry to the point of physical symptoms as well as any panic attacks. Denies any history of trauma or symptoms consistent with PTSD such as flashbacks, nightmares, hypervigilance, feelings of numbness or inability to connect with others.   Review of Systems  Constitutional: Negative for fever, chills and weight loss.  Cardiovascular: Negative for chest pain, palpitations and leg swelling.  Gastrointestinal: Negative for heartburn, nausea, vomiting and abdominal pain.  Musculoskeletal: Positive for joint pain (Right knee and left shoulder.) and neck pain.  Neurological: Negative for dizziness, tremors, focal weakness and seizures.      There were no vitals filed for this visit. Physical Exam  Vitals reviewed.  Constitutional: He appears well-developed and well-nourished. No distress.  Skin: He is not diaphoretic.  Musculoskeletal: Strength & Muscle Tone: within normal limits Gait & Station: normal Patient leans: N/A   Traumatic Brain Injury: No   Past Psychiatric History: Reviewed  Diagnosis: Major Depression Disorder.   Hospitalizations: Patient denies.   Outpatient Care: Patient denies.   Substance Abuse Care: Patient denies.   Self-Mutilation:Patient denies.   Suicidal Attempts: Patient denies.   Violent Behaviors: *Patient denies.    Past Medical History: Reviewed  Past Medical History  Diagnosis Date  . Depression   . Renal disorder   . Renal calculi   . Ligament tear of upper extremity April 2008    Left elbow  . Diverticulitis 2013  .  CRPS (complex regional pain syndrome), upper  limb   . Nephrolithiasis   . Allergy   . Anxiety   . Glaucoma   . GERD (gastroesophageal reflux disease)   . Hypertension     no per pt    History of Loss of Consciousness: Yes-Had a panic attack in 1988  Seizure History: No  Cardiac History: No   Allergies: Reviewed  Allergies  Allergen Reactions  . Adhesive [Tape]     Hives on area wear tape applied  . Hydrocodone-Acetaminophen Itching   Current Medications: Reviewed  Current Outpatient Prescriptions on File Prior to Visit  Medication Sig Dispense Refill  . brimonidine (ALPHAGAN P) 0.1 % SOLN 1 drop.      [name] esomeprazole (NEXIUM) 40 MG capsule Take 1 capsule (40 mg total) by mouth daily before breakfast.  30 capsule  3  . famotidine (PEPCID) 20 MG tablet Take 1 tablet (20 mg total) by mouth 2 (two) times daily.  60 tablet  6  . Gabapentin, PHN, 300 MG TABS Take 1,200 mg by mouth daily.      [name] LUMIGAN 0.01 % SOLN       . Omega-3 Fatty Acids (FISH OIL PO) Take by mouth daily.       No current facility-administered medications on file prior to visit.    Previous Psychotropic Medications: Reviewed  Medication  Dose   Sertraline  50-100 mg   Clonazepam  0.25 to 1 mg    Substance Abuse History in the last 12 months: Reviewed  Caffeine: Coffee 1 cup per day. Caffeinated Beverages 8-16 ounces per day.  Nicotine:Patient denies.  Alcohol: Patient reports periodic ZOX-0-9 cups per day Illicit Drugs: Patient denies.   Social History: Reviewed  Current Place of Residence: [place], [place] of Birth:[place]  Family Members: Patient lives with his wife and youngest son. Marital Status: Married Children: 4  Sons: 2 Daughters: 2 Relationships: Has a close relationship with his sons Education: Charity fundraiser Problems/Performance: Did well in school  Religious Beliefs/Practices: Prays.  History of Abuse: none  Occupational Experiences: Electrical engineer, currently on disability Military History: None.  Legal  History: Currently awaiting decision on disability Hobbies/Interests: Watching sports, painter   Family History: Reviewed  Family History  Problem Relation Age of Onset  . Lung cancer Father   . Kidney disease Mother   . Colon cancer Neg Hx   . Esophageal cancer Neg Hx   . Rectal cancer Neg Hx   . Stomach cancer Neg Hx     Psychiatric specialty examination:  Objective: Appearance: Casual and Well Groomed   Eye Contact:: Good   Speech: Clear and Coherent and Normal Rate   Volume: Normal   Mood: "Okay"   Affect: Appropriate and Congruent   Thought Process: Coherent, Logical and Loose   Orientation: Full   Thought Content: WDL   Suicidal Thoughts: No   Homicidal Thoughts: No   Judgement: Fair   Insight: Fair   Psychomotor Activity: Normal   Akathisia: No   Handed: Right   Memory: Immediate 3/3, recent 3/3   Concentration: Good   AIMS (if indicated): Not Indicated   Assets: Surveyor, mining  Housing  Social Support    Assessment:  AXIS I  Major Depressive Disorder, recurrent, moderate   Treatment Plan/Recommendations:  Plan of Care:  PLAN:  1. Affirm with the patient that the medications are taken as ordered. Patient  expressed understanding of how their medications were to be  used.    Laboratory:  No labs warranted at this time.    Psychotherapy: Therapy: brief supportive therapy provided.  Discussed psychosocial stressors in detail.    Medications:  Continue  the following psychiatric medications as written prior to this appointment with the following changes::  a) Continue Sertraline 75 mg If needed can increase to 100mg  before winter. -Risks and benefits, side effects and alternatives discussed with patient, he was given an opportunity to ask questions about his medication, illness, and treatment. All current psychiatric medications have been reviewed and discussed with the patient and adjusted as clinically appropriate. The  patient has been provided an accurate and updated list of the medications being now prescribed.  Also to consider therapy if needed for anxiety.  Routine PRN Medications:  Negative  Consultations: The patient was encouraged to keep all PCP and specialty clinic appointments.   Safety Concerns:   Patient told to call clinic if any problems occur. Patient advised to go to  ER  if he should develop SI/HI, side effects, or if symptoms worsen. Has crisis numbers to call if needed.    Other:   8. Patient was instructed to return to clinic in 3 months.  9. The patient was advised to call and cancel their mental health appointment within 24 hours of the appointment, if they are unable to keep the appointment, as well as the three no show and termination from clinic policy. 10. The patient expressed understanding of the plan and agrees with the above.  Time Spent: 25 minutes   02/13/2014 9:18 AM

## 2014-02-13 NOTE — Patient Instructions (Signed)
Continue meds. Look for some hobbies.

## 2014-02-18 DIAGNOSIS — Z96659 Presence of unspecified artificial knee joint: Secondary | ICD-10-CM | POA: Diagnosis not present

## 2014-02-18 DIAGNOSIS — Z471 Aftercare following joint replacement surgery: Secondary | ICD-10-CM | POA: Diagnosis not present

## 2014-04-17 DIAGNOSIS — Z96659 Presence of unspecified artificial knee joint: Secondary | ICD-10-CM | POA: Diagnosis not present

## 2014-05-18 ENCOUNTER — Encounter (INDEPENDENT_AMBULATORY_CARE_PROVIDER_SITE_OTHER): Payer: Self-pay

## 2014-05-18 ENCOUNTER — Ambulatory Visit (INDEPENDENT_AMBULATORY_CARE_PROVIDER_SITE_OTHER): Payer: Medicare Other | Admitting: Psychiatry

## 2014-05-18 ENCOUNTER — Encounter (HOSPITAL_COMMUNITY): Payer: Self-pay | Admitting: Psychiatry

## 2014-05-18 VITALS — BP 124/77 | HR 68 | Ht 66.0 in | Wt 180.0 lb

## 2014-05-18 DIAGNOSIS — H2510 Age-related nuclear cataract, unspecified eye: Secondary | ICD-10-CM | POA: Diagnosis not present

## 2014-05-18 DIAGNOSIS — F331 Major depressive disorder, recurrent, moderate: Secondary | ICD-10-CM | POA: Diagnosis not present

## 2014-05-18 DIAGNOSIS — H43399 Other vitreous opacities, unspecified eye: Secondary | ICD-10-CM | POA: Diagnosis not present

## 2014-05-18 DIAGNOSIS — H4011X0 Primary open-angle glaucoma, stage unspecified: Secondary | ICD-10-CM | POA: Diagnosis not present

## 2014-05-18 MED ORDER — SERTRALINE HCL 100 MG PO TABS: 100.0000 mg | ORAL_TABLET | Freq: Every day | ORAL | Status: DC

## 2014-05-18 NOTE — Progress Notes (Signed)
Patient ID: Nathan [name], male   DOB: 02-09-1951, 63 y.o.   MRN: [id number]   [place] Follow-up Outpatient Visit  [name] 21-May-1951  Date: 05/18/2014  History of Chief Complaint:   HPI Comments: [name] is a 63 y/o male with a past psychiatric history significant for Major Depressive Disorder. The patient is referred for psychiatric services for medication management.   Continue to take meds and is working. Some concern of anxiety at times. Not on klonopine any more. . Location: The patient reports a few episodes of depression related to his physical disability.   . Quality: The patient continues to  report some frustration about the limitations to physical activity due to a left shoulder injury which occurred 7 years ago at work.  He continues to go to school. His marital problems have resolved, though he still harbors some resentment. He is still waiting for his case against his employer to settle.  He reports he is taking his medications and denies any side effects.  He feels frustrated because of his physical limitations. He has had 4 surgeries. The worker's compensation case going on for the last 6 years. He wants it to be resolved so that he can move forward or have a lump-sum amount of money rather than having them weekly pay.   In the area of affective symptoms, patient appears euthymic. Patient denies current suicidal ideation, intent, or plan. Patient denies current homicidal ideation, intent, or plan. Patient denies auditory hallucinations. Patient denies visual hallucinations. Patient denies symptoms of paranoia. Patient states sleep is good. Appetite is good. Energy level is fair. Patient denies symptoms of anhedonia. Patient denies hopelessness, helplessness, or guilt.   . Severity: Depression: 6/10 (0=Very depressed; 5=Neutral; 10=Very Happy)  Anxiety- 3/10 (0=no anxiety; 5= moderate/tolerable anxiety; 10= panic attacks)   . Duration: Patient  reports he has suffered from depression for the past 7 years.  . Timing: Mood is worse in the evening. Waxing and waning throughout the years.  . Context: Ongoing court case.  . Modifying factors: Mood improves with spending time with family.  . Associated signs and symptoms: Denies any recent episodes const with mania, particularly decreased need for sleep with increased energy, grandiosity, impulsivity, hyperverbal and pressured speech, or increased productivity. Denies any recent symptoms consistent with psychosis, particularly auditory or visual hallucinations, thought broadcasting/insertion/withdrawal, or ideas of reference. Also denies excessive worry to the point of physical symptoms as well as any panic attacks. Denies any history of trauma or symptoms consistent with PTSD such as flashbacks, nightmares, hypervigilance, feelings of numbness or inability to connect with others.   Review of Systems  Constitutional: Negative for fever.  Cardiovascular: Negative for palpitations.  Gastrointestinal: Negative for nausea.  Musculoskeletal: Positive for joint pain (Right knee and left shoulder.) and neck pain.  Neurological: Negative for dizziness, tremors and seizures.  Psychiatric/Behavioral: Positive for depression. Negative for suicidal ideas, hallucinations and substance abuse. The patient is nervous/anxious.       Filed Vitals:   05/18/14 1503  BP: 124/77  Pulse: 68  Height: 5\' 6"  (1.676 m)  Weight: 180 lb (81.647 kg)   Physical Exam  Vitals reviewed.  Constitutional: He appears well-developed and well-nourished. No distress.  Skin: He is not diaphoretic.  Musculoskeletal: Strength & Muscle Tone: within normal limits Gait & Station: normal Patient leans: N/A   Traumatic Brain Injury: No   Past Psychiatric History: Reviewed  Diagnosis: Major Depression Disorder.   Hospitalizations: Patient denies.   Outpatient Care:  Patient denies.   Substance Abuse Care: Patient  denies.   Self-Mutilation:Patient denies.   Suicidal Attempts: Patient denies.   Violent Behaviors: *Patient denies.    Past Medical History: Reviewed  Past Medical History  Diagnosis Date  . Depression   . Renal disorder   . Renal calculi   . Ligament tear of upper extremity April 2008    Left elbow  . Diverticulitis 2013  . CRPS (complex regional pain syndrome), upper limb   . Nephrolithiasis   . Allergy   . Anxiety   . Glaucoma   . GERD (gastroesophageal reflux disease)   . Hypertension     no per pt    History of Loss of Consciousness: Yes-Had a panic attack in 1988  Seizure History: No  Cardiac History: No   Allergies: Reviewed  Allergies  Allergen Reactions  . Adhesive [Tape]     Hives on area wear tape applied  . Hydrocodone-Acetaminophen Itching   Current Medications: Reviewed  Current Outpatient Prescriptions on File Prior to Visit  Medication Sig Dispense Refill  . brimonidine (ALPHAGAN P) 0.1 % SOLN 1 drop.      [name] esomeprazole (NEXIUM) 40 MG capsule Take 1 capsule (40 mg total) by mouth daily before breakfast.  30 capsule  3  . famotidine (PEPCID) 20 MG tablet Take 1 tablet (20 mg total) by mouth 2 (two) times daily.  60 tablet  6  . Gabapentin, PHN, 300 MG TABS Take 1,200 mg by mouth daily.      [name] LUMIGAN 0.01 % SOLN       . Omega-3 Fatty Acids (FISH OIL PO) Take by mouth daily.       No current facility-administered medications on file prior to visit.      Family History: Reviewed  Family History  Problem Relation Age of Onset  . Lung cancer Father   . Kidney disease Mother   . Colon cancer Neg Hx   . Esophageal cancer Neg Hx   . Rectal cancer Neg Hx   . Stomach cancer Neg Hx     Psychiatric specialty examination:  Objective: Appearance: Casual and Well Groomed   Eye Contact:: Good   Speech: Clear and Coherent and Normal Rate   Volume: Normal   Mood: "Okay"   Affect: Appropriate and Congruent   Thought Process: Coherent, Logical and  Loose   Orientation: Full   Thought Content: WDL   Suicidal Thoughts: No   Homicidal Thoughts: No   Judgement: Fair   Insight: Fair   Psychomotor Activity: Normal   Akathisia: No   Handed: Right   Memory: Immediate 3/3, recent 3/3   Concentration: Good   AIMS (if indicated): Not Indicated   Assets: Surveyor, mining  Housing  Social Support    Assessment:  AXIS I  Major Depressive Disorder, recurrent, moderate   Treatment Plan/Recommendations:  Plan of Care:  PLAN:  1. Affirm with the patient that the medications are taken as ordered. Patient  expressed understanding of how their medications were to be used.    Laboratory:  No labs warranted at this time.    Psychotherapy: Therapy: brief supportive therapy provided.  Discussed psychosocial stressors in detail.    Medications:  Continue  the following psychiatric medications as written prior to this appointment with the following changes::  A) Increase zoloft 100mg  for depression and anxiety.   -Risks and benefits, side effects and alternatives discussed with patient, he was given an opportunity to ask  questions about his medication, illness, and treatment. All current psychiatric medications have been reviewed and discussed with the patient and adjusted as clinically appropriate. The patient has been provided an accurate and updated list of the medications being now prescribed.  Also to consider therapy if needed for anxiety.  Routine PRN Medications:  Negative  Consultations: The patient was encouraged to keep all PCP and specialty clinic appointments.   Safety Concerns:   Patient told to call clinic if any problems occur. Patient advised to go to  ER  if he should develop SI/HI, side effects, or if symptoms worsen. Has crisis numbers to call if needed.    Other:   8. Patient was instructed to return to clinic in 1 months.  9. The patient was advised to call and cancel their mental health  appointment within 24 hours of the appointment, if they are unable to keep the appointment, as well as the three no show and termination from clinic policy. 10. The patient expressed understanding of the plan and agrees with the above.  Time Spent: 25 minutes   05/18/2014 3:08 PM

## 2014-05-19 ENCOUNTER — Ambulatory Visit (HOSPITAL_COMMUNITY): Payer: Self-pay | Admitting: Psychiatry

## 2014-05-27 ENCOUNTER — Ambulatory Visit (INDEPENDENT_AMBULATORY_CARE_PROVIDER_SITE_OTHER): Payer: Medicare Other | Admitting: Family Medicine

## 2014-05-27 ENCOUNTER — Encounter: Payer: Self-pay | Admitting: Family Medicine

## 2014-05-27 ENCOUNTER — Ambulatory Visit (INDEPENDENT_AMBULATORY_CARE_PROVIDER_SITE_OTHER): Payer: Medicare Other

## 2014-05-27 VITALS — BP 115/60 | HR 75 | Ht 66.0 in | Wt 186.0 lb

## 2014-05-27 DIAGNOSIS — R7301 Impaired fasting glucose: Secondary | ICD-10-CM | POA: Diagnosis not present

## 2014-05-27 DIAGNOSIS — Z125 Encounter for screening for malignant neoplasm of prostate: Secondary | ICD-10-CM | POA: Diagnosis not present

## 2014-05-27 DIAGNOSIS — R7309 Other abnormal glucose: Secondary | ICD-10-CM

## 2014-05-27 DIAGNOSIS — M542 Cervicalgia: Secondary | ICD-10-CM | POA: Diagnosis not present

## 2014-05-27 DIAGNOSIS — M5030 Other cervical disc degeneration, unspecified cervical region: Secondary | ICD-10-CM

## 2014-05-27 DIAGNOSIS — Z0000 Encounter for general adult medical examination without abnormal findings: Secondary | ICD-10-CM | POA: Diagnosis not present

## 2014-05-27 DIAGNOSIS — I10 Essential (primary) hypertension: Secondary | ICD-10-CM

## 2014-05-27 DIAGNOSIS — M47817 Spondylosis without myelopathy or radiculopathy, lumbosacral region: Secondary | ICD-10-CM | POA: Diagnosis not present

## 2014-05-27 LAB — POCT GLYCOSYLATED HEMOGLOBIN (HGB A1C): Hemoglobin A1C: 6.1

## 2014-05-27 MED ORDER — AMBULATORY NON FORMULARY MEDICATION: Status: DC

## 2014-05-27 NOTE — Progress Notes (Signed)
Subjective:    Nathan [name] is a 63 y.o. male who presents for Medicare Annual/Subsequent preventive examination.   Preventive Screening-Counseling & Management  Tobacco History  Smoking status  . Former Smoker -- 1.00 packs/day for 10 years  . Types: Cigarettes  . Quit date: 09/25/1981  Smokeless tobacco  . Never Used    Problems Prior to Visit 1. Left sided neck pain for several months. Says starts around his ear and feels like an ear ache and then radiates over the upper trapezium towards his shoulder.  No worsening or alleviating factos.  Hx of RMS.  2. Hypertension- Pt denies chest pain, SOB, dizziness, or heart palpitations.  Taking meds as directed w/o problems.  Denies medication side effects.      Current Problems (verified) Patient Active Problem List   Diagnosis Date Noted  . Major depressive disorder, recurrent 04/12/2012  . CAUSALGIA OF UPPER LIMB 08/02/2010  . HYPERTENSION, BENIGN, MILD 08/02/2010  . ANEMIA 05/11/2010  . NEUROPATHY 08/10/2009  . NEPHROLITHIASIS 05/22/2009  . GERD 06/23/2008  . ALLERGIC RHINITIS 01/14/2008  . HYPERLIPIDEMIA NEC/NOS 06/28/2007  . FATTY LIVER DISEASE 06/28/2007  . DISEASE, CYSTIC KIDNEY NOS, CONGENITAL 06/28/2007  . MDD (major depressive disorder) 06/17/2007    Medications Prior to Visit Current Outpatient Prescriptions on File Prior to Visit  Medication Sig Dispense Refill  . brimonidine (ALPHAGAN P) 0.1 % SOLN 1 drop.      [name] LUMIGAN 0.01 % SOLN       . Omega-3 Fatty Acids (FISH OIL PO) Take by mouth daily.      . sertraline (ZOLOFT) 100 MG tablet Take 1 tablet (100 mg total) by mouth daily.  30 tablet  1   No current facility-administered medications on file prior to visit.    Current Medications (verified) Current Outpatient Prescriptions  Medication Sig Dispense Refill  . brimonidine (ALPHAGAN P) 0.1 % SOLN 1 drop.      [name] gabapentin (NEURONTIN) 600 MG tablet       . LUMIGAN 0.01 % SOLN       . Omega-3 Fatty  Acids (FISH OIL PO) Take by mouth daily.      . sertraline (ZOLOFT) 100 MG tablet Take 1 tablet (100 mg total) by mouth daily.  30 tablet  1   No current facility-administered medications for this visit.     Allergies (verified) Adhesive; Hydrocodone-acetaminophen; and Hydrocodone   PAST HISTORY  Family History Family History  Problem Relation Age of Onset  . Lung cancer Father   . Kidney disease Mother   . Colon cancer Neg Hx   . Esophageal cancer Neg Hx   . Rectal cancer Neg Hx   . Stomach cancer Neg Hx     Social History History  Substance Use Topics  . Smoking status: Former Smoker -- 1.00 packs/day for 10 years    Types: Cigarettes    Quit date: 09/25/1981  . Smokeless tobacco: Never Used  . Alcohol Use: 0.0 oz/week    0 Cans of beer per week     Comment: 1-2 per wk    Are there smokers in your home (other than you)?  No  Risk Factors Current exercise habits: some exercise  Dietary issues discussed: None   Cardiac risk factors: advanced age (older than 19 for men, 21 for women), dyslipidemia, hypertension and male gender.  Depression Screen (Note: if answer to either of the following is "Yes", a more complete depression screening is indicated)   Q1: Over the  past two weeks, have you felt down, depressed or hopeless? Yes  Q2: Over the past two weeks, have you felt little interest or pleasure in doing things? No  Have you lost interest or pleasure in daily life? No  Do you often feel hopeless? No  Do you cry easily over simple problems? No  Activities of Daily Living In your present state of health, do you have any difficulty performing the following activities?:  Driving? No Managing money?  No Feeding yourself? No Getting from bed to chair? No Climbing a flight of stairs? No Preparing food and eating?: No Bathing or showering? No Getting dressed: No Getting to the toilet? No Using the toilet:No Moving around from place to place: No In the past year  have you fallen or had a near fall?:No   Are you sexually active?  No  Do you have more than one partner?  No  Hearing Difficulties: No Do you often ask people to speak up or repeat themselves? No Do you experience ringing or noises in your ears? No Do you have difficulty understanding soft or whispered voices? No   Do you feel that you have a problem with memory? No  Do you often misplace items? No  Do you feel safe at home?  Yes  Cognitive Testing  Alert? Yes  Normal Appearance?Yes  Oriented to person? Yes  Place? Yes   Time? Yes  Recall of three objects?  Yes  Can perform simple calculations? Yes  Displays appropriate judgment?Yes  Can read the correct time from a watch face?Yes   Advanced Directives have been discussed with the patient? Yes   6 CIT score of 0/28 ( normal).    List the Names of Other Physician/Practitioners you currently use: 1.  Dr. Darral Dash for pain management  #2 Dr. Burnett Harry, Eye #3 Dr. De Nurse, depression  Indicate any recent Medical Services you may have received from other than [facility] providers in the past year (date may be approximate).  Immunization History  Administered Date(s) Administered  . Influenza Whole 06/25/2009  . Td 06/17/2007    Screening Tests Health Maintenance  Topic Date Due  . Zostavax  07/02/2011  . Influenza Vaccine  12/24/2014 (Originally 04/25/2014)  . Tetanus/tdap  06/16/2017  . Colonoscopy  01/24/2020    All answers were reviewed with the patient and necessary referrals were made:  METHENEY,CATHERINE, MD   05/27/2014   History reviewed: allergies, current medications, past family history, past medical history, past social history, past surgical history and problem list  Review of Systems A comprehensive review of systems was negative.    Objective:     Vision by Snellen chart: sEes Dr. Burnett Harry  Blood pressure 115/60, pulse 75, height 5\' 6"  (1.676 m), weight 186 lb (84.369 kg). Body mass index is 30.04  kg/(m^2).  BP 115/60  Pulse 75  Ht 5\' 6"  (1.676 m)  Wt 186 lb (84.369 kg)  BMI 30.04 kg/m2  General Appearance:    Alert, cooperative, no distress, appears stated age  Head:    Normocephalic, without obvious abnormality, atraumatic  Eyes:    PERRL, conjunctiva/corneas clear, EOM's intact,benign, both eyes       Ears:    Normal TM's and external ear canals, both ears  Nose:   Nares normal, septum midline, mucosa normal, no drainage    or sinus tenderness  Throat:   Lips, mucosa, and tongue normal; teeth and gums normal  Neck:   Supple, symmetrical, trachea midline, no adenopathy;  thyroid:  No enlargement/tenderness/nodules; no carotid   bruit or JVD  Back:     Symmetric, no curvature, ROM normal, no CVA tenderness  Lungs:     Clear to auscultation bilaterally, respirations unlabored  Chest wall:    No tenderness or deformity  Heart:    Regular rate and rhythm, S1 and S2 normal, no murmur, rub   or gallop  Abdomen:     Soft, non-tender, bowel sounds active all four quadrants,    no masses, no organomegaly  Genitalia:    Not performed   Rectal:    Not peformed  Extremities:   Extremities normal, atraumatic, no cyanosis or edema  Pulses:   2+ and symmetric all extremities  Skin:   Skin color, texture, turgor normal, no rashes or lesions  Lymph nodes:   Cervical, supraclavicular, and axillary nodes normal  Neurologic:   CNII-XII intact. Normal strength, sensation and reflexes      throughout     neck with normal flexion, decreased extension, decreased rotation right and left. He also had pain with rotation to the right and the left. Normal side bending. Nontender over the cervical spine. Nontender over the pedis muscle or around the ear itself.   Assessment:     Medicare Annual Wellness Exam     Plan:     During the course of the visit the patient was educated and counseled about appropriate screening and preventive services including:    Influenza vaccine  shingle  vaccine H.O provided and Rx provided.    Declined flu shot.    Left sided neck pain - based on his history and exam I still think this is mostly muscular. Recommend gentle stretches an anti-inflammatory as tolerated. He is concerned about his neck as a source of his pain. He did have an x-ray done the most year ago which showed some degenerative disc disease at C4-5 and 5/6. But nothing high enough up to really be causing the pain on the side of his neck or near his ear. We could certainly reconsider repeating the x-ray today. He prefers to do this. We will call with results once available.  HTN - well controlled. Due for labs. F/U in 6 mo.   Diet review for nutrition referral? Yes ____  Not Indicated _x_   Patient Instructions (the written plan) was given to the patient.  Medicare Attestation I have personally reviewed: The patient's medical and social history Their use of alcohol, tobacco or illicit drugs Their current medications and supplements The patient's functional ability including ADLs,fall risks, home safety risks, cognitive, and hearing and visual impairment Diet and physical activities Evidence for depression or mood disorders  The patient's weight, height, BMI, and visual acuity have been recorded in the chart.  I have made referrals, counseling, and provided education to the patient based on review of the above and I have provided the patient with a written personalized care plan for preventive services.     METHENEY,CATHERINE, MD   05/27/2014

## 2014-05-27 NOTE — Patient Instructions (Addendum)
You can download the myfitnesspal app.  This is a free online calorie counter and diet plan  Keep up a regular exercise program and make sure you are eating a healthy diet Try to eat 4 servings of dairy a day, or if you are lactose intolerant take a calcium with vitamin D daily.  Your vaccines are up to date.

## 2014-05-27 NOTE — Assessment & Plan Note (Addendum)
New diagnosis. Will monitor carefully every 6 months. Work on diet and regular exercise. We also discussed counting calories and working on weight loss. Recommend the Smart phone application called my fitness PAL. Recommended setting up a consistent exercise regimen/routine.

## 2014-05-29 DIAGNOSIS — I10 Essential (primary) hypertension: Secondary | ICD-10-CM | POA: Diagnosis not present

## 2014-05-30 LAB — COMPLETE METABOLIC PANEL WITH GFR
ALT: 44 U/L (ref 0–53)
AST: 38 U/L — ABNORMAL HIGH (ref 0–37)
Albumin: 4.3 g/dL (ref 3.5–5.2)
Alkaline Phosphatase: 97 U/L (ref 39–117)
BUN: 16 mg/dL (ref 6–23)
CO2: 27 mEq/L (ref 19–32)
Calcium: 9.4 mg/dL (ref 8.4–10.5)
Chloride: 100 mEq/L (ref 96–112)
Creat: 0.86 mg/dL (ref 0.50–1.35)
GFR, Est African American: >89 mL/min
GFR, Est Non African American: >89 mL/min
Glucose, Bld: 112 mg/dL — ABNORMAL HIGH (ref 70–99)
Potassium: 4.6 mEq/L (ref 3.5–5.3)
Sodium: 137 mEq/L (ref 135–145)
Total Bilirubin: 1.3 mg/dL — ABNORMAL HIGH (ref 0.2–1.2)
Total Protein: 7.4 g/dL (ref 6.0–8.3)

## 2014-05-30 LAB — LIPID PANEL
Cholesterol: 220 mg/dL — ABNORMAL HIGH (ref 0–200)
HDL: 35 mg/dL — ABNORMAL LOW (ref >39)
LDL Cholesterol: 110 mg/dL — ABNORMAL HIGH (ref 0–99)
Total CHOL/HDL Ratio: 6.3 Ratio
Triglycerides: 374 mg/dL — ABNORMAL HIGH (ref <150)
VLDL: 75 mg/dL — ABNORMAL HIGH (ref 0–40)

## 2014-05-30 LAB — PSA: PSA: 1.99 ng/mL (ref <=4.00)

## 2014-06-03 ENCOUNTER — Other Ambulatory Visit: Payer: Self-pay | Admitting: Family Medicine

## 2014-06-03 MED ORDER — ATORVASTATIN CALCIUM 40 MG PO TABS: 40.0000 mg | ORAL_TABLET | Freq: Every day | ORAL | Status: DC

## 2014-06-05 ENCOUNTER — Encounter: Payer: Self-pay | Admitting: Sports Medicine

## 2014-07-20 ENCOUNTER — Ambulatory Visit (HOSPITAL_COMMUNITY): Payer: Self-pay | Admitting: Psychiatry

## 2014-07-23 ENCOUNTER — Encounter (INDEPENDENT_AMBULATORY_CARE_PROVIDER_SITE_OTHER): Payer: Self-pay

## 2014-07-23 ENCOUNTER — Encounter (HOSPITAL_COMMUNITY): Payer: Self-pay | Admitting: Psychiatry

## 2014-07-23 ENCOUNTER — Ambulatory Visit (INDEPENDENT_AMBULATORY_CARE_PROVIDER_SITE_OTHER): Payer: Medicare Other | Admitting: Psychiatry

## 2014-07-23 VITALS — BP 140/72 | HR 75 | Ht 65.0 in | Wt 183.0 lb

## 2014-07-23 DIAGNOSIS — F331 Major depressive disorder, recurrent, moderate: Secondary | ICD-10-CM

## 2014-07-23 MED ORDER — SERTRALINE HCL 100 MG PO TABS: 100.0000 mg | ORAL_TABLET | Freq: Every day | ORAL | Status: DC

## 2014-07-23 MED ORDER — BUSPIRONE HCL 10 MG PO TABS: 10.0000 mg | ORAL_TABLET | Freq: Every day | ORAL | Status: DC

## 2014-07-23 NOTE — Progress Notes (Signed)
Patient ID: Nathan [name], male   DOB: [birth year], 63 y.o.   MRN: [id number]   [place] Follow-up Outpatient Visit  Nathan [name] 07-01-1951  Date: 05/18/2014  History of Chief Complaint:   HPI Comments: Nathan [name] is a 63 y/o male with a past psychiatric history significant for Major Depressive Disorder. The patient is referred for psychiatric services for medication management.   Continue to take meds and is working. Some concern of anxiety at times. Not on klonopine any more. . Location: The patient reports a few episodes of depression related to his physical disability.  Says he would take some non addicting med for prn anxiety.  . Quality: The patient continues to  report some frustration about the limitations to physical activity due to a left shoulder injury which occurred 7 years ago at work.  He continues to go to school. His marital problems have resolved, though he still harbors some resentment. He is still waiting for his case against his employer to settle.  Keeps busy with his 60 yr old son. Other 3 kids are grown.  In the area of affective symptoms, patient appears euthymic. Patient denies current suicidal ideation, intent, or plan. Patient denies current homicidal ideation, intent, or plan. Patient denies auditory hallucinations. Patient denies visual hallucinations. Patient denies symptoms of paranoia. Patient states sleep is good. Appetite is good. Energy level is fair. Patient denies symptoms of anhedonia. Patient denies hopelessness, helplessness, or guilt.   . Severity: Depression: 6/10 (0=Very depressed; 5=Neutral; 10=Very Happy)  Anxiety- 4/10 (0=no anxiety; 5= moderate/tolerable anxiety; 10= panic attacks)   . Duration: Patient reports he has suffered from depression for the past 7 years.  . Timing: Mood is worse in the evening. Waxing and waning throughout the years.  . Context: Ongoing court case.  . Modifying factors: Mood improves with  spending time with family.  . Associated signs and symptoms: Denies any recent episodes const with mania, particularly decreased need for sleep with increased energy, grandiosity, impulsivity, hyperverbal and pressured speech, or increased productivity. Denies any recent symptoms consistent with psychosis, particularly auditory or visual hallucinations, thought broadcasting/insertion/withdrawal, or ideas of reference. Also denies excessive worry to the point of physical symptoms as well as any panic attacks. Denies any history of trauma or symptoms consistent with PTSD such as flashbacks, nightmares, hypervigilance, feelings of numbness or inability to connect with others.   Review of Systems  Constitutional: Negative for fever.  Cardiovascular: Negative for palpitations.  Gastrointestinal: Negative for nausea.  Musculoskeletal: Positive for neck pain. Joint pain: Right knee and left shoulder.  Neurological: Negative for dizziness, tremors and seizures.  Psychiatric/Behavioral: Negative for depression, suicidal ideas, hallucinations and substance abuse. The patient is nervous/anxious.       Filed Vitals:   07/23/14 1310  BP: 140/72  Pulse: 75  Height: 5\' 5"  (1.651 m)  Weight: 183 lb (83.008 kg)   Physical Exam  Vitals reviewed.  Constitutional: He appears well-developed and well-nourished. No distress.  Skin: He is not diaphoretic.  Musculoskeletal: Strength & Muscle Tone: within normal limits Gait & Station: normal Patient leans: N/A   Traumatic Brain Injury: No   Past Psychiatric History: Reviewed  Diagnosis: Major Depression Disorder.   Hospitalizations: Patient denies.   Outpatient Care: Patient denies.   Substance Abuse Care: Patient denies.   Self-Mutilation:Patient denies.   Suicidal Attempts: Patient denies.   Violent Behaviors: *Patient denies.    Past Medical History: Reviewed  Past Medical History  Diagnosis Date  . Depression   .  Renal disorder   . Renal  calculi   . Ligament tear of upper extremity April 2008    Left elbow  . Diverticulitis 2013  . CRPS (complex regional pain syndrome), upper limb   . Nephrolithiasis   . Allergy   . Anxiety   . Glaucoma   . GERD (gastroesophageal reflux disease)   . Hypertension     no per pt    History of Loss of Consciousness: Yes-Had a panic attack in 1988  Seizure History: No  Cardiac History: No   Allergies: Reviewed  Allergies  Allergen Reactions  . Adhesive [Tape]     Hives on area wear tape applied  . Hydrocodone-Acetaminophen Itching  . Hydrocodone Rash   Current Medications: Reviewed  Current Outpatient Prescriptions on File Prior to Visit  Medication Sig Dispense Refill  . AMBULATORY NON FORMULARY MEDICATION Medication Name: Zostavax IM x 1  1 vial  0  . atorvastatin (LIPITOR) 40 MG tablet Take 1 tablet (40 mg total) by mouth daily.  90 tablet  3  . brimonidine (ALPHAGAN P) 0.1 % SOLN 1 drop.      [name] gabapentin (NEURONTIN) 600 MG tablet       . LUMIGAN 0.01 % SOLN       . Omega-3 Fatty Acids (FISH OIL PO) Take by mouth daily.       No current facility-administered medications on file prior to visit.      Family History: Reviewed  Family History  Problem Relation Age of Onset  . Lung cancer Father   . Kidney disease Mother   . Colon cancer Neg Hx   . Esophageal cancer Neg Hx   . Rectal cancer Neg Hx   . Stomach cancer Neg Hx     Psychiatric specialty examination:  Objective: Appearance: Casual and Well Groomed   Eye Contact:: Good   Speech: Clear and Coherent and Normal Rate   Volume: Normal   Mood: "Okay"   Affect: Appropriate and Congruent   Thought Process: Coherent, Logical and Loose   Orientation: Full   Thought Content: WDL   Suicidal Thoughts: No   Homicidal Thoughts: No   Judgement: Fair   Insight: Fair   Psychomotor Activity: Normal   Akathisia: No   Handed: Right   Memory: Immediate 3/3, recent 3/3   Concentration: Good   AIMS (if indicated):  Not Indicated   Assets: Surveyor, mining  Housing  Social Support    Assessment:  AXIS I  Major Depressive Disorder, recurrent, moderate   Treatment Plan/Recommendations:  Plan of Care:  PLAN:  1. Affirm with the patient that the medications are taken as ordered. Patient  expressed understanding of how their medications were to be used.    Laboratory:  No labs warranted at this time.    Psychotherapy: Therapy: brief supportive therapy provided.  Discussed psychosocial stressors in detail.    Medications:  Continue  the following psychiatric medications as written prior to this appointment with the following changes::  A) Increase zoloft 100mg  for depression and anxiety.  Add buspirone for anxiety and worries. 10mg  qd.  -Risks and benefits, side effects and alternatives discussed with patient, he was given an opportunity to ask questions about his medication, illness, and treatment. All current psychiatric medications have been reviewed and discussed with the patient and adjusted as clinically appropriate. The patient has been provided an accurate and updated list of the medications being now prescribed.  Also to consider therapy if needed  for anxiety.  Routine PRN Medications:  Negative  Consultations: The patient was encouraged to keep all PCP and specialty clinic appointments.   Safety Concerns:   Patient told to call clinic if any problems occur. Patient advised to go to  ER  if he should develop SI/HI, side effects, or if symptoms worsen. Has crisis numbers to call if needed.    Other:   8. Patient was instructed to return to clinic in 1 months.  9. The patient was advised to call and cancel their mental health appointment within 24 hours of the appointment, if they are unable to keep the appointment, as well as the three no show and termination from clinic policy. 10. The patient expressed understanding of the plan and agrees with the above.   Time Spent: 25 minutes Dyquan Minks, MD  07/23/2014 1:27 PM

## 2014-07-27 DIAGNOSIS — H2513 Age-related nuclear cataract, bilateral: Secondary | ICD-10-CM | POA: Diagnosis not present

## 2014-07-27 DIAGNOSIS — H4011X1 Primary open-angle glaucoma, mild stage: Secondary | ICD-10-CM | POA: Diagnosis not present

## 2014-07-27 DIAGNOSIS — H43393 Other vitreous opacities, bilateral: Secondary | ICD-10-CM | POA: Diagnosis not present

## 2014-08-12 ENCOUNTER — Ambulatory Visit (INDEPENDENT_AMBULATORY_CARE_PROVIDER_SITE_OTHER): Payer: Medicare Other | Admitting: Family Medicine

## 2014-08-12 ENCOUNTER — Encounter: Payer: Self-pay | Admitting: Family Medicine

## 2014-08-12 VITALS — BP 113/60 | HR 71 | Temp 98.0°F | Ht 65.0 in | Wt 184.0 lb

## 2014-08-12 DIAGNOSIS — H578 Other specified disorders of eye and adnexa: Secondary | ICD-10-CM

## 2014-08-12 DIAGNOSIS — H5789 Other specified disorders of eye and adnexa: Secondary | ICD-10-CM

## 2014-08-12 DIAGNOSIS — R6889 Other general symptoms and signs: Secondary | ICD-10-CM

## 2014-08-12 MED ORDER — OLOPATADINE HCL 0.2 % OP SOLN: 1.0000 [drp] | Freq: Every day | OPHTHALMIC | Status: DC

## 2014-08-12 NOTE — Progress Notes (Signed)
   Subjective:    Patient ID: Nathan [name], male    DOB: March 17, 1951, 63 y.o.   MRN: [id number]  HPI Complains about eye irritation x 1 month. He actually did go see his eye doctor they felt like he was not having any significant problems. He does have a history of glaucoma and uses Lumigan and Alphagan prescription drops. He sought these for 3 to 4 days thinking that might be causing irritation but really did not seem to help. He also tried lubricating drops for the last month without any significant improvement. He says his eyes itch until watery and feels like he may have noticed some drainage or pus at one point in time. He denies any floaters or spots and occasionally has discomfort and pain. Vision is normal with his lenses. Eye shave been red as well. He says today is a good day.    Review of Systems     Objective:   Physical Exam  Constitutional: He is oriented to person, place, and time. He appears well-developed and well-nourished.  HENT:  Head: Normocephalic and atraumatic.  Eyes: Conjunctivae and EOM are normal. Pupils are equal, round, and reactive to light. Right eye exhibits no discharge. Left eye exhibits no discharge.  Sclera is injected bilaterally.   Neurological: He is alert and oriented to person, place, and time.  Skin: Skin is warm and dry.  Psychiatric: He has a normal mood and affect. His behavior is normal.          Assessment & Plan:  Red eye - possible allergic conjunctivitis. Will treat with Pataday.  No significant improvement in one week and encouraged him to call me back and I would like for him to get a 2nd opinion with an ophthalmologist for his redeye. He's been using his current glaucoma eyedrops reverse 3 years. Certainly he could develop a reaction to them but this would be very unusual. Plus he actually held them for about 4 days just to see if you notice any improvement and he did not.

## 2014-09-20 ENCOUNTER — Telehealth (HOSPITAL_COMMUNITY): Payer: Self-pay | Admitting: Psychiatry

## 2014-09-21 ENCOUNTER — Other Ambulatory Visit (HOSPITAL_COMMUNITY): Payer: Self-pay | Admitting: *Deleted

## 2014-09-21 DIAGNOSIS — F331 Major depressive disorder, recurrent, moderate: Secondary | ICD-10-CM

## 2014-09-21 NOTE — Telephone Encounter (Signed)
[facility] called for refill for Buspar 10mg . Per Dr. De Nurse, pt is authorized for 1 refill Buspar 10mg , Qty 30. Pt has appt on 1/5.

## 2014-09-23 MED ORDER — BUSPIRONE HCL 10 MG PO TABS: 10.0000 mg | ORAL_TABLET | Freq: Every day | ORAL | Status: DC

## 2014-09-23 MED ORDER — SERTRALINE HCL 100 MG PO TABS: 100.0000 mg | ORAL_TABLET | Freq: Every day | ORAL | Status: DC

## 2014-09-23 NOTE — Telephone Encounter (Signed)
[facility] called for refill for Sertraline 100mg . Per Dr. De Nurse, pt is authorized for 1 refill Sertraline 10mg , Qty 30. Pt has follow up apt 1/5.

## 2014-09-23 NOTE — Telephone Encounter (Signed)
Rx called in for Zoloft 100mg  1 po qd for depression.  Marlane Hatcher. Larri Yehle RPAC 6:43 PM 09/23/2014

## 2014-09-23 NOTE — Telephone Encounter (Signed)
PT needs a script for Sertraline

## 2014-09-24 ENCOUNTER — Ambulatory Visit (HOSPITAL_COMMUNITY): Payer: Self-pay | Admitting: Psychiatry

## 2014-09-29 ENCOUNTER — Encounter (HOSPITAL_COMMUNITY): Payer: Self-pay | Admitting: Psychiatry

## 2014-09-29 ENCOUNTER — Ambulatory Visit (INDEPENDENT_AMBULATORY_CARE_PROVIDER_SITE_OTHER): Payer: Medicare Other | Admitting: Psychiatry

## 2014-09-29 VITALS — HR 88 | Ht 64.0 in | Wt 182.0 lb

## 2014-09-29 DIAGNOSIS — F331 Major depressive disorder, recurrent, moderate: Secondary | ICD-10-CM

## 2014-09-29 MED ORDER — SERTRALINE HCL 100 MG PO TABS: 100.0000 mg | ORAL_TABLET | Freq: Every day | ORAL | Status: DC

## 2014-09-29 MED ORDER — BUSPIRONE HCL 10 MG PO TABS: 10.0000 mg | ORAL_TABLET | Freq: Every morning | ORAL | Status: DC

## 2014-09-29 NOTE — Progress Notes (Signed)
Patient ID: Nathan [name], male   DOB: [birth year], 64 y.o.   MRN: [id number]   [facility] Follow-up Outpatient Visit  Nathan [name] 27-Jul-1951  Date: 05/18/2014  History of Chief Complaint:   HPI Comments: Nathan [name] is a 64 y/o male with a past psychiatric history significant for Major Depressive Disorder. The patient is referred for psychiatric services for medication management.   Continue to take meds and is working. Some concern of anxiety at times. Not on klonopine any more. . Location: The patient reports a few episodes of depression related to his physical disability.  Says he would take some non addicting med for prn anxiety.  . Quality: Zoloft was increased to 100mg  last time and buspirone was added. Both things have also contributed to improve his mood and anxiety. The patient continues to  report some frustration about the limitations to physical activity due to a left shoulder injury which occurred 7 years ago at work.  He continues to go to school. His marital problems have resolved. His case regarding shoulder injury with the employer has settled down and he has got his compensation he is doing much better regarding his mood because he was worried about his finances. He also has got free medical now that has to be related with his shoulder injury expenses . He wants to cut down zoloft back to 50mg  soon or in next few visit.  Keeps busy with his 53 yr old son. Other 3 kids are grown.  In the area of affective symptoms, patient appears euthymic. Patient denies current suicidal ideation, intent, or plan. Patient denies current homicidal ideation, intent, or plan. Patient denies auditory hallucinations. Patient denies visual hallucinations. Patient denies symptoms of paranoia. Patient states sleep is good. Appetite is good. Energy level is fair. Patient denies symptoms of anhedonia. Patient denies hopelessness, helplessness, or guilt.   . Severity: Depression: 7/10  (0=Very depressed; 5=Neutral; 10=Very Happy) . Mood has improved considering case has been settled.  Anxiety- 4/10 (0=no anxiety; 5= moderate/tolerable anxiety; 10= panic attacks)   . Duration: Patient reports he has suffered from depression for the past 7 years.  . Timing: Mood is worse in the evening. Waxing and waning throughout the years.  . Context: Ongoing court case.  . Modifying factors: Mood improves with spending time with family.  . Associated signs and symptoms: Denies any recent episodes const with mania, particularly decreased need for sleep with increased energy, grandiosity, impulsivity, hyperverbal and pressured speech, or increased productivity. Denies any recent symptoms consistent with psychosis, particularly auditory or visual hallucinations, thought broadcasting/insertion/withdrawal, or ideas of reference. Also denies excessive worry to the point of physical symptoms as well as any panic attacks. Denies any history of trauma or symptoms consistent with PTSD such as flashbacks, nightmares, hypervigilance, feelings of numbness or inability to connect with others.   Review of Systems  Constitutional: Negative for chills.  Cardiovascular: Negative for chest pain.  Gastrointestinal: Negative for diarrhea.  Musculoskeletal: Positive for neck pain. Joint pain: Right knee and left shoulder.  Neurological: Negative for tremors and seizures.  Psychiatric/Behavioral: Negative for depression, suicidal ideas, hallucinations and substance abuse.      Filed Vitals:   09/29/14 1609  Pulse: 88  Height: 5\' 4"  (1.626 m)  Weight: 182 lb (82.555 kg)   Physical Exam  Vitals reviewed.  Constitutional: He appears well-developed and well-nourished. No distress.  Skin: He is not diaphoretic.  Musculoskeletal: Strength & Muscle Tone: within normal limits Gait & Station: normal Patient  leans: N/A   Traumatic Brain Injury: No   Past Psychiatric History: Reviewed  Diagnosis: Major  Depression Disorder.   Hospitalizations: Patient denies.   Outpatient Care: Patient denies.   Substance Abuse Care: Patient denies.   Self-Mutilation:Patient denies.   Suicidal Attempts: Patient denies.   Violent Behaviors: *Patient denies.    Past Medical History: Reviewed  Past Medical History  Diagnosis Date  . Depression   . Renal disorder   . Renal calculi   . Ligament tear of upper extremity April 2008    Left elbow  . Diverticulitis 2013  . CRPS (complex regional pain syndrome), upper limb   . Nephrolithiasis   . Allergy   . Anxiety   . Glaucoma   . GERD (gastroesophageal reflux disease)   . Hypertension     no per pt    History of Loss of Consciousness: Yes-Had a panic attack in 1988  Seizure History: No  Cardiac History: No   Allergies: Reviewed  Allergies  Allergen Reactions  . Adhesive [Tape]     Hives on area wear tape applied  . Hydrocodone-Acetaminophen Itching  . Hydrocodone Rash   Current Medications: Reviewed  Current Outpatient Prescriptions on File Prior to Visit  Medication Sig Dispense Refill  . AMBULATORY NON FORMULARY MEDICATION Medication Name: Zostavax IM x 1 1 vial 0  . atorvastatin (LIPITOR) 40 MG tablet Take 1 tablet (40 mg total) by mouth daily. 90 tablet 3  . brimonidine (ALPHAGAN P) 0.1 % SOLN 1 drop.    [name] gabapentin (NEURONTIN) 600 MG tablet     . LUMIGAN 0.01 % SOLN     . Olopatadine HCl 0.2 % SOLN Apply 1 drop to eye daily. 2.5 mL 0  . Omega-3 Fatty Acids (FISH OIL PO) Take by mouth daily.     No current facility-administered medications on file prior to visit.      Family History: Reviewed  Family History  Problem Relation Age of Onset  . Lung cancer Father   . Kidney disease Mother   . Colon cancer Neg Hx   . Esophageal cancer Neg Hx   . Rectal cancer Neg Hx   . Stomach cancer Neg Hx     Psychiatric specialty examination:  Objective: Appearance: Casual and Well Groomed   Eye Contact:: Good   Speech: Clear and  Coherent and Normal Rate   Volume: Normal   Mood: improved and better then last time.   Affect: Appropriate and Congruent   Thought Process: Coherent, Logical and Loose   Orientation: Full   Thought Content: WDL   Suicidal Thoughts: No   Homicidal Thoughts: No   Judgement: Fair   Insight: Fair   Psychomotor Activity: Normal   Akathisia: No   Handed: Right   Memory: Immediate 3/3, recent 3/3   Concentration: Good   AIMS (if indicated): Not Indicated   Assets: Surveyor, mining  Housing  Social Support    Assessment:  AXIS I  Major Depressive Disorder, recurrent, moderate   Treatment Plan/Recommendations:  Plan of Care:  PLAN:  1. Affirm with the patient that the medications are taken as ordered. Patient  expressed understanding of how their medications were to be used.    Laboratory:  No labs warranted at this time.    Psychotherapy: Therapy: brief supportive therapy provided.  Discussed psychosocial stressors in detail.    Medications:  Continue  the following psychiatric medications as written prior to this appointment with the following changes::  A)continue zoloft 100mg   for depression and anxiety.  Continue  buspirone for anxiety and worries. 10mg  qd.  May consider to lower dose of zoloft in next few visits as it was recently increased under stress of case which has settled down.  -Risks and benefits, side effects and alternatives discussed with patient, he was given an opportunity to ask questions about his medication, illness, and treatment. All current psychiatric medications have been reviewed and discussed with the patient and adjusted as clinically appropriate. The patient has been provided an accurate and updated list of the medications being now prescribed.  Also to consider therapy if needed for anxiety.  Routine PRN Medications:  Negative  Consultations: The patient was encouraged to keep all PCP and specialty clinic  appointments.   Safety Concerns:   Patient told to call clinic if any problems occur. Patient advised to go to  ER  if he should develop SI/HI, side effects, or if symptoms worsen. Has crisis numbers to call if needed.    Other:   8. Patient was instructed to return to clinic in 2  months.  9. The patient was advised to call and cancel their mental health appointment within 24 hours of the appointment, if they are unable to keep the appointment, as well as the three no show and termination from clinic policy. 10. The patient expressed understanding of the plan and agrees with the above.  Time Spent: 25 minutes Eliberto Sole, MD  09/29/2014 4:15 PM

## 2014-10-06 ENCOUNTER — Ambulatory Visit (INDEPENDENT_AMBULATORY_CARE_PROVIDER_SITE_OTHER): Payer: Medicare Other | Admitting: Physician Assistant

## 2014-10-06 ENCOUNTER — Encounter: Payer: Self-pay | Admitting: Physician Assistant

## 2014-10-06 VITALS — BP 141/89 | HR 74 | Wt 173.0 lb

## 2014-10-06 DIAGNOSIS — L720 Epidermal cyst: Secondary | ICD-10-CM | POA: Diagnosis not present

## 2014-10-06 DIAGNOSIS — E785 Hyperlipidemia, unspecified: Secondary | ICD-10-CM

## 2014-10-06 NOTE — Progress Notes (Addendum)
   Subjective:    Patient ID: Nathan [name], male    DOB: [birth year], 64 y.o.   MRN: [id number]  HPI  Pt presents to the clinic with harden lump on side of right neck. Start out kinda like a black head and a little bit of stuff came out. Then harden and will not go away. No pain. Been present for over 1 year.   He would also like cholesterol checked. Started medication in September wants recheck.     Review of Systems  All other systems reviewed and are negative.      Objective:   Physical Exam  Constitutional: He appears well-developed and well-nourished.  HENT:  Head:            Assessment & Plan:  Hyperlipidemia- will recheck lipid and given lab slip. Pt aware to be fasting.   Epidermal cyst- Sebaceous Cyst Excision Procedure Note  Pre-operative Diagnosis: epidermal cyst of right side of neck  Post-operative Diagnosis: same  Locations:right lower neck  Indications: irritation  Anesthesia: Lidocaine 1% without epinephrine without added sodium bicarbonate  Procedure Details  History of allergy to iodine: no  Patient informed of the risks (including bleeding and infection) and benefits of the  procedure and Verbal informed consent obtained.  The lesion and surrounding area was given a sterile prep using betadyne and draped in the usual sterile fashion. An incision was made over the cyst, which was dissected free of the surrounding tissue and removed.  The cyst was filled with typical sebaceous material.  The wound was closed with 4-0 ethlion using simple interrupted stitches. Antibiotic ointment and a sterile dressing applied.  The specimen was not sent for pathologic examination. The patient tolerated the procedure well.  EBL: scant  Findings:  Epidermal cyst with sac  Condition: Stable  Complications: none.  Plan: 1. Instructed to keep the wound dry and covered for 24-48h and clean thereafter. 2. Warning signs of infection were reviewed.   3.  Recommended that the patient use OTC acetaminophen as needed for pain.  4. Return for suture removal in 7 days.

## 2014-10-06 NOTE — Patient Instructions (Signed)
Epidermal Cyst An epidermal cyst is sometimes called a sebaceous cyst, epidermal inclusion cyst, or infundibular cyst. These cysts usually contain a substance that looks "pasty" or "cheesy" and may have a bad smell. This substance is a protein called keratin. Epidermal cysts are usually found on the face, neck, or trunk. They may also occur in the vaginal area or other parts of the genitalia of both men and women. Epidermal cysts are usually small, painless, slow-growing bumps or lumps that move freely under the skin. It is important not to try to pop them. This may cause an infection and lead to tenderness and swelling. CAUSES  Epidermal cysts may be caused by a deep penetrating injury to the skin or a plugged hair follicle, often associated with acne. SYMPTOMS  Epidermal cysts can become inflamed and cause:  Redness.  Tenderness.  Increased temperature of the skin over the bumps or lumps.  Grayish-white, bad smelling material that drains from the bump or lump. DIAGNOSIS  Epidermal cysts are easily diagnosed by your caregiver during an exam. Rarely, a tissue sample (biopsy) may be taken to rule out other conditions that may resemble epidermal cysts. TREATMENT   Epidermal cysts often get better and disappear on their own. They are rarely ever cancerous.  If a cyst becomes infected, it may become inflamed and tender. This may require opening and draining the cyst. Treatment with antibiotics may be necessary. When the infection is gone, the cyst may be removed with minor surgery.  Small, inflamed cysts can often be treated with antibiotics or by injecting steroid medicines.  Sometimes, epidermal cysts become large and bothersome. If this happens, surgical removal in your caregiver's office may be necessary. HOME CARE INSTRUCTIONS  Only take over-the-counter or prescription medicines as directed by your caregiver.  Take your antibiotics as directed. Finish them even if you start to feel  better. SEEK MEDICAL CARE IF:   Your cyst becomes tender, red, or swollen.  Your condition is not improving or is getting worse.  You have any other questions or concerns. MAKE SURE YOU:  Understand these instructions.  Will watch your condition.  Will get help right away if you are not doing well or get worse. Document Released: 08/12/2004 Document Revised: 12/04/2011 Document Reviewed: 03/20/2011 Exit[employer] Patient Information 2015 [employer]. This information is not intended to replace advice given to you by your health care provider. Make sure you discuss any questions you have with your health care provider.  

## 2014-10-07 DIAGNOSIS — E785 Hyperlipidemia, unspecified: Secondary | ICD-10-CM | POA: Diagnosis not present

## 2014-10-07 LAB — LIPID PANEL
Cholesterol: 117 mg/dL (ref 0–200)
HDL: 40 mg/dL (ref >39)
LDL Cholesterol: 40 mg/dL (ref 0–99)
Total CHOL/HDL Ratio: 2.9 Ratio
Triglycerides: 183 mg/dL — ABNORMAL HIGH (ref <150)
VLDL: 37 mg/dL (ref 0–40)

## 2014-10-13 ENCOUNTER — Ambulatory Visit (INDEPENDENT_AMBULATORY_CARE_PROVIDER_SITE_OTHER): Payer: Medicare Other | Admitting: Physician Assistant

## 2014-10-13 VITALS — BP 114/62 | HR 79 | Temp 98.0°F | Wt 188.0 lb

## 2014-10-13 DIAGNOSIS — Z5189 Encounter for other specified aftercare: Secondary | ICD-10-CM

## 2014-10-13 DIAGNOSIS — Z4802 Encounter for removal of sutures: Secondary | ICD-10-CM

## 2014-10-13 MED ORDER — DOXYCYCLINE HYCLATE 100 MG PO TABS: 100.0000 mg | ORAL_TABLET | Freq: Two times a day (BID) | ORAL | Status: DC

## 2014-10-13 NOTE — Progress Notes (Signed)
   Subjective:    Patient ID: Nathan [name], male    DOB: January 18, 1951, 64 y.o.   MRN: [id number]  HPI  Nathan [name] is here for a suture removal and wound check. He reports the size of the area has increased. The area is slightly swollen. Denies fever chills or sweats.     Review of Systems     Objective:   Physical Exam        Assessment & Plan:  Wound check- doxycycline 100mg  1po bid for 10 days. Cool compresses. Vitamin E cream to help with scar formation. Appears that there could be keloid formation over scar that causes the area to increase. Advise patient to return if symptoms persist or worsen. Iran Planas PA-C

## 2014-10-19 DIAGNOSIS — H40013 Open angle with borderline findings, low risk, bilateral: Secondary | ICD-10-CM | POA: Diagnosis not present

## 2014-10-26 DIAGNOSIS — M25512 Pain in left shoulder: Secondary | ICD-10-CM | POA: Diagnosis not present

## 2014-11-27 ENCOUNTER — Encounter: Payer: Self-pay | Admitting: Internal Medicine

## 2014-11-30 ENCOUNTER — Ambulatory Visit (HOSPITAL_COMMUNITY): Payer: Self-pay | Admitting: Psychiatry

## 2014-12-01 ENCOUNTER — Encounter: Payer: Self-pay | Admitting: Internal Medicine

## 2014-12-03 ENCOUNTER — Ambulatory Visit (INDEPENDENT_AMBULATORY_CARE_PROVIDER_SITE_OTHER): Payer: Medicare Other | Admitting: Psychiatry

## 2014-12-03 ENCOUNTER — Encounter (HOSPITAL_COMMUNITY): Payer: Self-pay | Admitting: Psychiatry

## 2014-12-03 VITALS — Ht 64.0 in | Wt 182.0 lb

## 2014-12-03 DIAGNOSIS — F331 Major depressive disorder, recurrent, moderate: Secondary | ICD-10-CM | POA: Diagnosis not present

## 2014-12-03 MED ORDER — BUSPIRONE HCL 10 MG PO TABS
10.0000 mg | ORAL_TABLET | Freq: Every morning | ORAL | Status: DC
Start: 2014-12-03 — End: 2015-02-11

## 2014-12-03 MED ORDER — SERTRALINE HCL 100 MG PO TABS: 100.0000 mg | ORAL_TABLET | Freq: Every day | ORAL | Status: DC

## 2014-12-03 NOTE — Progress Notes (Signed)
Patient ID: Nathan [name], male   DOB: 1950-10-24, 64 y.o.   MRN: [id number]   [facility] Follow-up Outpatient Visit  Nathan [name] October 25, 1950  Date: 12/02/2014  History of Chief Complaint:   HPI Comments: Nathan [name] is a 64 y/o male with a past psychiatric history significant for Major Depressive Disorder. The patient is referred for psychiatric services for medication management.   Last increase his Zoloft to 100 mg he tolerated well. Considering what his wife and family at times. Interventional well he is doing better regarding depression there is no reported side effects. He continues to have pain on his left shoulder related to the injury. Most times at home or tries to keep himself busy with other activities. Keeps busy with his 36 yr old son. Other 3 kids are grown.  In the area of affective symptoms, patient appears euthymic. Patient denies current suicidal ideation, intent, or plan. Patient denies current homicidal ideation, intent, or plan. Patient denies auditory hallucinations. Patient denies visual hallucinations. Patient denies symptoms of paranoia. Patient states sleep is good. Appetite is good. Energy level is fair. Patient denies symptoms of anhedonia. Patient denies hopelessness, helplessness, or guilt.   . Severity: Depression: 7/10 (0=Very depressed; 5=Neutral; 10=Very Happy) . Mood has improved considering case has been settled.  Anxiety- 4/10 (0=no anxiety; 5= moderate/tolerable anxiety; 10= panic attacks)   . Duration: Patient reports he has suffered from depression for the past 7 years.  . Timing: Mood is worse in the evening. Waxing and waning throughout the years.  . Context: Ongoing court case.  . Modifying factors: Mood improves with spending time with family.    Review of Systems  Constitutional: Negative.   Cardiovascular: Negative for chest pain.  Neurological: Negative for tremors.  Psychiatric/Behavioral: Negative for depression.       Filed Vitals:   12/03/14 0917  Height: 5\' 4"  (1.626 m)  Weight: 182 lb (82.555 kg)   Physical Exam  Vitals reviewed.  Constitutional: He appears well-developed and well-nourished. No distress.  Skin: He is not diaphoretic.  Musculoskeletal: Strength & Muscle Tone: within normal limits Gait & Station: normal Patient leans: N/A     Past Medical History: Reviewed  Past Medical History  Diagnosis Date  . Depression   . Renal disorder   . Renal calculi   . Ligament tear of upper extremity April 2008    Left elbow  . Diverticulitis 2013  . CRPS (complex regional pain syndrome), upper limb   . Nephrolithiasis   . Allergy   . Anxiety   . Glaucoma   . GERD (gastroesophageal reflux disease)   . Hypertension     no per pt    History of Loss of Consciousness: Yes-Had a panic attack in 1988  Seizure History: No  Cardiac History: No   Allergies: Reviewed  Allergies  Allergen Reactions  . Adhesive [Tape]     Hives on area wear tape applied  . Hydrocodone-Acetaminophen Itching  . Hydrocodone Rash   Current Medications: Reviewed  Current Outpatient Prescriptions on File Prior to Visit  Medication Sig Dispense Refill  . atorvastatin (LIPITOR) 40 MG tablet Take 1 tablet (40 mg total) by mouth daily. 90 tablet 3  . doxycycline (VIBRA-TABS) 100 MG tablet Take 1 tablet (100 mg total) by mouth 2 (two) times daily. 20 tablet 0  . gabapentin (NEURONTIN) 600 MG tablet     . LUMIGAN 0.01 % SOLN     . Olopatadine HCl 0.2 % SOLN Apply 1 drop  to eye daily. 2.5 mL 0  . Omega-3 Fatty Acids (FISH OIL PO) Take by mouth daily.     No current facility-administered medications on file prior to visit.      Family History: Reviewed  Family History  Problem Relation Age of Onset  . Lung cancer Father   . Kidney disease Mother   . Colon cancer Neg Hx   . Esophageal cancer Neg Hx   . Rectal cancer Neg Hx   . Stomach cancer Neg Hx     Psychiatric specialty examination:   Objective: Appearance: Casual and Well Groomed   Eye Contact:: Good   Speech: Clear and Coherent and Normal Rate   Volume: Normal   Mood: euthymic and reactive  Affect: Appropriate and Congruent   Thought Process: Coherent, Logical and Loose   Orientation: Full   Thought Content: WDL   Suicidal Thoughts: No   Homicidal Thoughts: No   Judgement: Fair   Insight: Fair   Psychomotor Activity: Normal   Akathisia: No   Handed: Right   Memory: Immediate 3/3, recent 3/3   Concentration: Good   AIMS (if indicated): Not Indicated   Assets: Surveyor, mining  Housing  Social Support    Assessment:  AXIS I  Major Depressive Disorder, recurrent, moderate   Treatment Plan/Recommendations:  Plan of Care:  PLAN:  1. Affirm with the patient that the medications are taken as ordered. Patient  expressed understanding of how their medications were to be used.    Laboratory:  No labs warranted at this time.    Psychotherapy: Therapy: brief supportive therapy provided.  Discussed psychosocial stressors in detail.    Medications:  Continue  the following psychiatric medications as written prior to this appointment with the following changes: Continue Zoloft 100 mg. Continue buspirone 15 mg. He was thinking of cutting down the Zoloft but I recommend keeping at the same dose. -Risks and benefits, side effects and alternatives discussed with patient, he was given an opportunity to ask questions about his medication, illness, and treatment. All current psychiatric medications have been reviewed and discussed with the patient and adjusted as clinically appropriate. The patient has been provided an accurate and updated list of the medications being now prescribed.  Also to consider therapy if needed for anxiety.  Routine PRN Medications:  Negative  Consultations: The patient was encouraged to keep all PCP and specialty clinic appointments.   Safety Concerns:    Patient told to call clinic if any problems occur. Patient advised to go to  ER  if he should develop SI/HI, side effects, or if symptoms worsen. Has crisis numbers to call if needed.    Other:   8. Patient was instructed to return to clinic in 2  months.  9. The patient was advised to call and cancel their mental health appointment within 24 hours of the appointment, if they are unable to keep the appointment, as well as the three no show and termination from clinic policy. 10. The patient expressed understanding of the plan and agrees with the above.  Time Spent: 25 minutes Adrien Dietzman, MD  12/03/2014 9:41 AM

## 2014-12-07 DIAGNOSIS — H4011X1 Primary open-angle glaucoma, mild stage: Secondary | ICD-10-CM | POA: Diagnosis not present

## 2014-12-31 DIAGNOSIS — H04123 Dry eye syndrome of bilateral lacrimal glands: Secondary | ICD-10-CM | POA: Diagnosis not present

## 2014-12-31 DIAGNOSIS — H2513 Age-related nuclear cataract, bilateral: Secondary | ICD-10-CM | POA: Diagnosis not present

## 2014-12-31 DIAGNOSIS — H35373 Puckering of macula, bilateral: Secondary | ICD-10-CM | POA: Diagnosis not present

## 2014-12-31 DIAGNOSIS — H4011X2 Primary open-angle glaucoma, moderate stage: Secondary | ICD-10-CM | POA: Diagnosis not present

## 2015-01-15 DIAGNOSIS — H4011X2 Primary open-angle glaucoma, moderate stage: Secondary | ICD-10-CM | POA: Diagnosis not present

## 2015-01-18 ENCOUNTER — Ambulatory Visit (AMBULATORY_SURGERY_CENTER): Payer: Self-pay | Admitting: *Deleted

## 2015-01-18 VITALS — Ht 66.0 in | Wt 189.8 lb

## 2015-01-18 DIAGNOSIS — K3189 Other diseases of stomach and duodenum: Secondary | ICD-10-CM

## 2015-01-18 DIAGNOSIS — K31A0 Gastric intestinal metaplasia, unspecified: Secondary | ICD-10-CM

## 2015-01-18 DIAGNOSIS — Z86010 Personal history of colonic polyps: Secondary | ICD-10-CM

## 2015-01-18 MED ORDER — NA SULFATE-K SULFATE-MG SULF 17.5-3.13-1.6 GM/177ML PO SOLN: ORAL | Status: DC

## 2015-01-18 NOTE — Progress Notes (Signed)
No allergies to eggs or soy. No problems with anesthesia.  Pt given Emmi instructions for colonoscopy  No oxygen use  No diet drug use  

## 2015-01-29 DIAGNOSIS — H4011X2 Primary open-angle glaucoma, moderate stage: Secondary | ICD-10-CM | POA: Diagnosis not present

## 2015-02-01 ENCOUNTER — Other Ambulatory Visit: Payer: Self-pay | Admitting: Internal Medicine

## 2015-02-01 ENCOUNTER — Encounter: Payer: Self-pay | Admitting: Internal Medicine

## 2015-02-01 ENCOUNTER — Ambulatory Visit (AMBULATORY_SURGERY_CENTER): Payer: Medicare Other | Admitting: Internal Medicine

## 2015-02-01 VITALS — BP 104/77 | HR 72 | Temp 97.3°F | Resp 14 | Ht 66.0 in | Wt 189.0 lb

## 2015-02-01 DIAGNOSIS — J45909 Unspecified asthma, uncomplicated: Secondary | ICD-10-CM | POA: Diagnosis not present

## 2015-02-01 DIAGNOSIS — K3189 Other diseases of stomach and duodenum: Secondary | ICD-10-CM

## 2015-02-01 DIAGNOSIS — Z86010 Personal history of colonic polyps: Secondary | ICD-10-CM

## 2015-02-01 DIAGNOSIS — D120 Benign neoplasm of cecum: Secondary | ICD-10-CM | POA: Diagnosis not present

## 2015-02-01 DIAGNOSIS — K2950 Unspecified chronic gastritis without bleeding: Secondary | ICD-10-CM | POA: Diagnosis not present

## 2015-02-01 DIAGNOSIS — K31A0 Gastric intestinal metaplasia, unspecified: Secondary | ICD-10-CM

## 2015-02-01 DIAGNOSIS — Z1211 Encounter for screening for malignant neoplasm of colon: Secondary | ICD-10-CM | POA: Diagnosis not present

## 2015-02-01 MED ORDER — SODIUM CHLORIDE 0.9 % IV SOLN: 500.0000 mL | INTRAVENOUS | Status: DC

## 2015-02-01 NOTE — Op Note (Signed)
[facility]  [address]. [place], [address]   ENDOSCOPY PROCEDURE REPORT  PATIENT: Nathan [name], Nathan [name]  MR#: [id number] BIRTHDATE: 17-Jul-1951 , 63  yrs. old GENDER: male ENDOSCOPIST: Jerene Bears, MD PROCEDURE DATE:  02/01/2015 PROCEDURE:  EGD w/ biopsy ASA CLASS:     Class II INDICATIONS:  Surveillance for gastric metaplasia, last endoscopy March 2015. MEDICATIONS: Monitored anesthesia care, Propofol 400 mg IV, and this was the total dose used for all procedures at this session TOPICAL ANESTHETIC: none  DESCRIPTION OF PROCEDURE: After the risks benefits and alternatives of the procedure were thoroughly explained, informed consent was obtained.  The LB [id number] endoscope was introduced through the mouth and advanced to the second portion of the duodenum , Without limitations.  The instrument was slowly withdrawn as the mucosa was fully examined.   ESOPHAGUS: The mucosa of the esophagus appeared normal.   Z line regular  STOMACH: There was mild antral gastropathy noted.  Cold forcep biopsies were taken at the antrum and angularis.  Additional biopsies were taken in a separate jar in the gastric body, cardia and fundus. Biopsies obtained to rule out gastric metaplasia and H. pylori.  DUODENUM: Mild duodenal inflammation was found in the duodenal bulb. Normal-appearing second portion of duodenum.  Retroflexed views revealed a hiatal hernia.     The scope was then withdrawn from the patient and the procedure completed.  COMPLICATIONS: There were no immediate complications.  ENDOSCOPIC IMPRESSION: 1.   The mucosa of the esophagus appeared normal 2.   There was mild antral gastropathy noted; Multiple gastric biopsies obtained given history of gastric metaplasia 3.   Duodenal inflammation was found in the duodenal bulb  RECOMMENDATIONS: Await biopsy results Avoid NSAIDs  eSigned:  Jerene Bears, MD 02/01/2015 12:27 PM    [id number]  Madilyn Fireman, MD and The Patient

## 2015-02-01 NOTE — Progress Notes (Signed)
Called to room to assist during endoscopic procedure.  Patient ID and intended procedure confirmed with present staff. Received instructions for my participation in the procedure from the performing physician.  

## 2015-02-01 NOTE — Op Note (Signed)
[facility]  [address]. [place], [address]   COLONOSCOPY PROCEDURE REPORT  PATIENT: Nathan [name], Nathan [name]  MR#: [id number] BIRTHDATE: August 25, 1951 , 63  yrs. old GENDER: male ENDOSCOPIST: Jerene Bears, MD PROCEDURE DATE:  02/01/2015 PROCEDURE:   Colonoscopy, surveillance and Colonoscopy with snare polypectomy First Screening Colonoscopy - Avg.  risk and is 50 yrs.  old or older - No.  Prior Negative Screening - Now for repeat screening. N/A  History of Adenoma - Now for follow-up colonoscopy & has been > or = to 3 yrs.  Yes hx of adenoma.  Has been 3 or more years since last colonoscopy.  Polyps Removed Today ASA CLASS:   Class II INDICATIONS:Surveillance due to prior colonic neoplasia and PH Colon Adenoma in colonoscopy from 2011. MEDICATIONS: Monitored anesthesia care, Propofol 400 mg IV, and this was the total dose used for all procedures at this session  DESCRIPTION OF PROCEDURE:   After the risks benefits and alternatives of the procedure were thoroughly explained, informed consent was obtained.  The digital rectal exam revealed no rectal mass.   The LB CF-H180AL Loaner [id number]  endoscope was introduced through the anus and advanced to the cecum, which was identified by both the appendix and ileocecal valve. No adverse events experienced.   The quality of the prep was good.  (MoviPrep was used)  The instrument was then slowly withdrawn as the colon was fully examined.  COLON FINDINGS: A sessile polyp measuring 6 mm in size was found at the cecum.  A polypectomy was performed with a cold snare.  The resection was complete, the polyp tissue was completely retrieved and sent to histology.   There was moderate diverticulosis noted in the ascending colon, descending colon, and sigmoid colon. Retroflexed views revealed internal hemorrhoids. The time to cecum = 1.9 Withdrawal time = 12.2   The scope was withdrawn and the procedure completed. COMPLICATIONS: There  were no immediate complications.  ENDOSCOPIC IMPRESSION: 1.   Sessile polyp was found at the cecum; polypectomy was performed with a cold snare 2.   Moderate diverticulosis was noted in the ascending colon, descending colon, and sigmoid colon  RECOMMENDATIONS: 1.  Await pathology results 2.  High fiber diet 3.  Repeat Colonoscopy in 5 years. 4.  You will receive a letter within 1-2 weeks with the results of your biopsy as well as final recommendations.  Please call my office if you have not received a letter after 3 weeks.  eSigned:  Jerene Bears, MD 02/01/2015 12:29 PM   cc: Beatrice Lecher, MD and The Patient

## 2015-02-01 NOTE — Progress Notes (Signed)
A/ox3 pleased with MAC, report April RN

## 2015-02-01 NOTE — Patient Instructions (Signed)

## 2015-02-02 ENCOUNTER — Telehealth: Payer: Self-pay

## 2015-02-02 NOTE — Telephone Encounter (Signed)
  Follow up Call-  Call back number 02/01/2015 11/28/2013  Post procedure Call Back phone  # [phone]  Permission to leave phone message Yes Yes     Patient questions:  Do you have a fever, pain , or abdominal swelling? No. Pain Score  0 *  Have you tolerated food without any problems? Yes.    Have you been able to return to your normal activities? Yes.    Do you have any questions about your discharge instructions: Diet   No. Medications  No. Follow up visit  No.  Do you have questions or concerns about your Care? No.  Actions: * If pain score is 4 or above: No action needed, pain <4.  No problems per the pt. maw

## 2015-02-04 ENCOUNTER — Encounter: Payer: Self-pay | Admitting: Internal Medicine

## 2015-02-11 ENCOUNTER — Other Ambulatory Visit (HOSPITAL_COMMUNITY): Payer: Self-pay | Admitting: Psychiatry

## 2015-02-12 DIAGNOSIS — H4011X1 Primary open-angle glaucoma, mild stage: Secondary | ICD-10-CM | POA: Diagnosis not present

## 2015-02-12 NOTE — Telephone Encounter (Signed)
Received fax from [facility] for a refill for Buspar 10mg . Per Dr. De Nurse, pt is authorized for a refill for Buspar 10mg , Qty 30. Prescription was sent to pharmacy. Pt has a follow up appt on 6/10. Called and informed to pt of prescription status.

## 2015-02-18 ENCOUNTER — Encounter: Payer: Self-pay | Admitting: *Deleted

## 2015-02-25 ENCOUNTER — Telehealth: Payer: Self-pay | Admitting: Internal Medicine

## 2015-02-25 NOTE — Telephone Encounter (Signed)
Pt scheduled to see Lori Hvozdovic, PA-C for left upper quadrant abdominal pain, pt wants to be seen sooner than 1st available. Pt scheduled for OV 03/03/15@2pm . Pt aware of appt.

## 2015-03-01 ENCOUNTER — Other Ambulatory Visit (HOSPITAL_COMMUNITY): Payer: Self-pay | Admitting: Psychiatry

## 2015-03-03 ENCOUNTER — Ambulatory Visit: Payer: Self-pay | Admitting: Physician Assistant

## 2015-03-05 ENCOUNTER — Ambulatory Visit (HOSPITAL_COMMUNITY): Payer: Self-pay | Admitting: Psychiatry

## 2015-03-08 NOTE — Telephone Encounter (Signed)
Received fax from [facility] for a refill for Zoloft 100mg . Per Dr. De Nurse, pt is authorized for a refill for Zoloft 100mg , Qty 30. Prescription was sent to pharmacy. Pt has a f/u appt on 6/16. Called and informed pt of prescription status. Pt states and shows understanding.

## 2015-03-11 ENCOUNTER — Ambulatory Visit (INDEPENDENT_AMBULATORY_CARE_PROVIDER_SITE_OTHER): Payer: Medicare Other | Admitting: Psychiatry

## 2015-03-11 ENCOUNTER — Encounter (HOSPITAL_COMMUNITY): Payer: Self-pay | Admitting: Psychiatry

## 2015-03-11 VITALS — BP 118/80 | HR 88 | Ht 66.0 in | Wt 182.0 lb

## 2015-03-11 DIAGNOSIS — F411 Generalized anxiety disorder: Secondary | ICD-10-CM | POA: Diagnosis not present

## 2015-03-11 DIAGNOSIS — F5102 Adjustment insomnia: Secondary | ICD-10-CM | POA: Diagnosis not present

## 2015-03-11 DIAGNOSIS — F331 Major depressive disorder, recurrent, moderate: Secondary | ICD-10-CM

## 2015-03-11 MED ORDER — SERTRALINE HCL 100 MG PO TABS: 100.0000 mg | ORAL_TABLET | Freq: Every day | ORAL | Status: DC

## 2015-03-11 MED ORDER — BUSPIRONE HCL 10 MG PO TABS: 10.0000 mg | ORAL_TABLET | Freq: Two times a day (BID) | ORAL | Status: DC

## 2015-03-11 MED ORDER — TRAZODONE HCL 50 MG PO TABS: 50.0000 mg | ORAL_TABLET | Freq: Every day | ORAL | Status: DC

## 2015-03-11 NOTE — Progress Notes (Signed)
Patient ID: Nathan [name], male   DOB: 11/19/1950, 64 y.o.   MRN: [id number]   [facility] Follow-up Outpatient Visit  Nathan [name] [birth year]  Date: 12/02/2014  History of Chief Complaint:   HPI Comments: Nathan [name] is a 64 y/o male with a past psychiatric history significant for Major Depressive Disorder. GAD, insomnia.  The patient is referred for psychiatric services for medication management.   Last visit adding BuSpar didn't help anxiety but he is suffering from anxiety excessive worries. He is worried about having the granddaughter at home because daughter is involved in drug addiction. [name] wife is involved with taking care of the granddaughter and text angry and trying to focus on her daughter that upsets the patient.  Keeps busy with his 18 yr old son. Other 3 kids are grown. Addition of another grand daughter twin daughters one at a time has effected family dynameics. He standing to start counseling to work on family dynamics In the area of affective symptoms, patient appears somewhat dysthymic and worriful. Patient denies current suicidal ideation, intent, or plan. Patient denies current homicidal ideation, intent, or plan. Patient denies auditory hallucinations. Patient denies visual hallucinations. Patient denies symptoms of paranoia. Patient states sleep has been effected and is poor.[name] Appetite is good. Energy level is fair. Patient denies symptoms of anhedonia. Patient denies hopelessness, helplessness, or guilt.   . Severity: Depression: 6/10 (0=Very depressed; 5=Neutral; 10=Very Happy) . Mood has improved considering case has been settled.  Anxiety- 4/10 (0=no anxiety; 5= moderate/tolerable anxiety; 10= panic attacks)  Sleep disturbed due to worries at night . Duration: Patient reports he has suffered from depression for the past 7 years.  . Timing: Mood is worse in the evening. Waxing and waning throughout the years.  . Context: Ongoing court  case.  . Modifying factors: Mood improves with spending time with family. Aggravating factor; recent change in family dyanmics.    Review of Systems  Constitutional: Negative.   Cardiovascular: Negative for chest pain.  Skin: Negative for rash.  Neurological: Negative for tremors.  Psychiatric/Behavioral: The patient is nervous/anxious and has insomnia.       Filed Vitals:   03/11/15 1124  BP: 118/80  Pulse: 88  Height: 5\' 6"  (1.676 m)  Weight: 182 lb (82.555 kg)   Physical Exam  Vitals reviewed.  Constitutional: He appears well-developed and well-nourished. No distress.  Skin: He is not diaphoretic.  Musculoskeletal: Strength & Muscle Tone: within normal limits Gait & Station: normal Patient leans: N/A     Past Medical History: Reviewed  Past Medical History  Diagnosis Date  . Depression   . Renal disorder   . Renal calculi   . Ligament tear of upper extremity April 2008    Left elbow  . Diverticulitis 2013  . CRPS (complex regional pain syndrome), upper limb   . Nephrolithiasis   . Allergy   . Anxiety   . Glaucoma   . GERD (gastroesophageal reflux disease)   . Hypertension     no per pt  . Hyperlipidemia   . Intestinal metaplasia of gastric mucosa   . History of Helicobacter pylori infection   . Adenomatous colon polyp   . Diverticulosis     History of Loss of Consciousness: Nathan [name] a panic attack in 1988  Seizure History: No  Cardiac History: No   Allergies: Reviewed  Allergies  Allergen Reactions  . Adhesive [Tape]     Hives on area wear tape applied  . Hydrocodone-Acetaminophen Itching  .  Hydrocodone Rash   Current Medications: Reviewed  Current Outpatient Prescriptions on File Prior to Visit  Medication Sig Dispense Refill  . atorvastatin (LIPITOR) 40 MG tablet Take 1 tablet (40 mg total) by mouth daily. 90 tablet 3  . b complex vitamins tablet Take 1 tablet by mouth daily.    [name] gabapentin (NEURONTIN) 600 MG tablet 600 mg 3 (three)  times daily.     [name] LUMIGAN 0.01 % SOLN     . Omega-3 Fatty Acids (FISH OIL PO) Take by mouth daily.    . sertraline (ZOLOFT) 100 MG tablet TAKE 1 TABLET (100 MG TOTAL) BY MOUTH DAILY. 30 tablet 0  . VOLTAREN 1 % GEL   5   No current facility-administered medications on file prior to visit.      Family History: Reviewed  Family History  Problem Relation Age of Onset  . Lung cancer Father   . Kidney disease Mother   . Colon cancer Neg Hx   . Esophageal cancer Neg Hx   . Rectal cancer Neg Hx   . Stomach cancer Neg Hx     Psychiatric specialty examination:  Objective: Appearance: Casual and Well Groomed   Eye Contact:: Good   Speech: Clear and Coherent and Normal Rate   Volume: Normal   Mood: somewhat worriful   Affect: Appropriate and Congruent   Thought Process: Coherent, Logical and Loose   Orientation: Full   Thought Content: WDL   Suicidal Thoughts: No   Homicidal Thoughts: No   Judgement: Fair   Insight: Fair   Psychomotor Activity: Normal   Akathisia: No   Handed: Right   Memory: Immediate 3/3, recent 3/3   Concentration: Good   AIMS (if indicated): Not Indicated   Assets: Surveyor, mining  Housing  Social Support    Assessment:  AXIS I  Major Depressive Disorder, recurrent, moderate. GAD. Insomnia   Treatment Plan/Recommendations:  Plan of Care:  PLAN:  1. Affirm with the patient that the medications are taken as ordered. Patient  expressed understanding of how their medications were to be used.    Laboratory:  No labs warranted at this time.    Psychotherapy: Therapy: brief supportive therapy provided.  Discussed psychosocial stressors in detail.    Medications:  Continue  the following psychiatric medications as written prior to this appointment with the following changes: Continue Zoloft 100 mg increse buspirone 10mg  bid for anxiety Add trazadone 50mg  for sleep.  -Risks and benefits, side effects and  alternatives discussed with patient, he was given an opportunity to ask questions about his medication, illness, and treatment. All current psychiatric medications have been reviewed and discussed with the patient and adjusted as clinically appropriate. The patient has been provided an accurate and updated list of the medications being now prescribed.  Also to consider therapy if needed for anxiety.  Routine PRN Medications:  Negative  Consultations: The patient was encouraged to keep all PCP and specialty clinic appointments.   Safety Concerns:   Patient told to call clinic if any problems occur. Patient advised to go to  ER  if he should develop SI/HI, side effects, or if symptoms worsen. Has crisis numbers to call if needed.    Other:   8. Patient was instructed to return to clinic in 2  months.  9. The patient was advised to call and cancel their mental health appointment within 24 hours of the appointment, if they are unable to keep the appointment, as well  as the three no show and termination from clinic policy. 10. The patient expressed understanding of the plan and agrees with the above.   Nathan Capron, MD  03/11/2015 11:35 AM

## 2015-03-22 ENCOUNTER — Encounter: Payer: Self-pay | Admitting: Internal Medicine

## 2015-03-22 ENCOUNTER — Ambulatory Visit (INDEPENDENT_AMBULATORY_CARE_PROVIDER_SITE_OTHER): Payer: Medicare Other | Admitting: Internal Medicine

## 2015-03-22 VITALS — BP 124/70 | HR 56 | Ht 65.25 in | Wt 183.5 lb

## 2015-03-22 DIAGNOSIS — D126 Benign neoplasm of colon, unspecified: Secondary | ICD-10-CM

## 2015-03-22 DIAGNOSIS — K3189 Other diseases of stomach and duodenum: Secondary | ICD-10-CM

## 2015-03-22 DIAGNOSIS — Z8619 Personal history of other infectious and parasitic diseases: Secondary | ICD-10-CM

## 2015-03-22 DIAGNOSIS — R109 Unspecified abdominal pain: Secondary | ICD-10-CM | POA: Diagnosis not present

## 2015-03-22 DIAGNOSIS — R143 Flatulence: Secondary | ICD-10-CM

## 2015-03-22 DIAGNOSIS — IMO0001 Reserved for inherently not codable concepts without codable children: Secondary | ICD-10-CM

## 2015-03-22 DIAGNOSIS — R140 Abdominal distension (gaseous): Secondary | ICD-10-CM

## 2015-03-22 DIAGNOSIS — K31A0 Gastric intestinal metaplasia, unspecified: Secondary | ICD-10-CM

## 2015-03-22 MED ORDER — METRONIDAZOLE 500 MG PO TABS: 500.0000 mg | ORAL_TABLET | Freq: Three times a day (TID) | ORAL | Status: DC

## 2015-03-22 NOTE — Patient Instructions (Signed)
We have sent medications to your pharmacy for you to pick up at your convenience.   Follow up as needed.

## 2015-03-22 NOTE — Progress Notes (Signed)
   Subjective:    Patient ID: Nathan [name], male    DOB: [birth year], 64 y.o.   MRN: [id number]  HPI Nathan [name] is a 64 yo male with PMH of diverticulitis, adenomatous colon polyp, gastric metaplasia, history of H. pylori status post treatment, GERD who is seen in follow-up. He had a surveillance colonoscopy on 02/01/2015 which revealed a 6 mm sessile polyp in the cecum removed with cold snare. This was found to be a tubular adenoma. Exam also notable for moderate diverticulosis in the ascending, descending and sigmoid colon. Upper endoscopy performed on the same day for surveillance of gastric metaplasia seen at endoscopy in March 2015. This revealed mild antral gastropathy. Biopsies were taken from the rock small and distal stomach and placed in separate jars. Gastric biopsy showed mild chronic gastritis negative for H. pylori, no further metaplasia and no dysplasia was seen.  He reports he is having a dull intermittent left middle quadrant abdominal pain. Doesn't relate to bowel movement or eating as far as he knows. History of kidney stones but this feels like a different pain. His kidney stone pain was more sharp. He's eating a high-fiber diet. Bowel movements have been regular without blood in his stool or melena. Denies diarrhea or constipation. Good appetite. No trouble swallowing. No nausea or vomiting   Review of Systems As per history of present illness, otherwise negative  Current Medications, Allergies, Past Medical History, Past Surgical History, Family History and Social History were reviewed in [employer] record.     Objective:   Physical Exam BP 124/70 mmHg  Pulse 56  Ht 5' 5.25" (1.657 m)  Wt 183 lb 8 oz (83.235 kg)  BMI 30.32 kg/m2 Constitutional: Well-developed and well-nourished. No distress. HEENT: Normocephalic and atraumatic. Oropharynx is clear and moist. No oropharyngeal exudate. Conjunctivae are normal.  No scleral icterus. Neck:  Neck supple. Trachea midline. Cardiovascular: Normal rate, regular rhythm and intact distal pulses. No M/R/G Pulmonary/chest: Effort normal and breath sounds normal. No wheezing, rales or rhonchi. Abdominal: Soft, nontender, nondistended. Bowel sounds active throughout. There are no masses palpable. No hepatosplenomegaly. Extremities: no clubbing, cyanosis, or edema Lymphadenopathy: No cervical adenopathy noted. Neurological: Alert and oriented to person place and time. Skin: Skin is warm and dry. No rashes noted. Psychiatric: Normal mood and affect. Behavior is normal.     Assessment & Plan:   64 yo male with PMH of diverticulitis, adenomatous colon polyp, gastric metaplasia, GERD who is seen in follow-up.   1. Left-sided abd pain -- nontender on exam today and nontoxic appearing. Suspicion for diverticulitis very low. He does report at times gas and bloating. Will treat for bacterial overgrowth and possibly mild colonic inflammation with metronidazole 500 mg 3 times a day 7 days. This may improve gas, bloating and hopefully his left-sided discomfort. I asked that he notify me if pain worsens or continues after therapy. He voices understanding. High-fiber diet is recommended  2. Gastric metaplasia -- resolved as of last endoscopy. History of H. pylori status post treatment. Mild chronic gastritis. No plans for repeat endoscopic surveillance given lack of metaplasia seen recently. Hopefully having treated H. pylori adequately his risk of metaplasia returning is quite low  3. History of adenomatous colon polyps -- repeat colonoscopy in 5 years for surveillance.

## 2015-03-26 ENCOUNTER — Ambulatory Visit (INDEPENDENT_AMBULATORY_CARE_PROVIDER_SITE_OTHER): Payer: Medicare Other | Admitting: Licensed Clinical Social Worker

## 2015-03-26 ENCOUNTER — Telehealth: Payer: Self-pay | Admitting: Internal Medicine

## 2015-03-26 DIAGNOSIS — F331 Major depressive disorder, recurrent, moderate: Secondary | ICD-10-CM | POA: Diagnosis not present

## 2015-03-26 NOTE — Telephone Encounter (Signed)
Spoke with patient and he states he has nausea and a headache after taking 2 days of Flagyl. He states he is taking the Flagyl with food. Dr. Hilarie Fredrickson out of office. DOD- Dr. Olevia Perches Please, advise.

## 2015-03-26 NOTE — Progress Notes (Signed)
Patient:   Nathan [name]   DOB:   November 02, 1950  MR Number:  [id number]  Location:  [facility] [address] [place] [address] Dept: [phone]           Date of Service:   03/26/15  Start Time:   9:00am End Time:   10:10am  Provider/Observer:  Coqui Work       Billing Code/Service: [phone]  Comprehensive Clinical Assessment  Information for assessment provided by: patient   Chief Complaint:    Family problems     Presenting Problem/Symptoms: "unresolved issues" in his marriage       Previous MH/SA diagnoses: depression and anxiety      Mental Health Symptoms:    Depression:  PHQ-9= 11 (moderate)  Current symptoms include depressed mood, anhedonia, insomnia, fatigue, feelings of worthlessness/guilt, difficulty concentrating,  Difficulty with sleep for past few months.    Anxiety:  Excessive worry (finances, health, marriage), trouble relaxing, fear of something awful happening  GAD-7= 11 (moderately severe)  Panic Attacks: none recent  Self-Harm Potential: Thoughts of Self-Harm: none Previous attempts? no Preoccupation with death? no History of acts of self-harm? no  Dangerousness to Others Potential: Denies Previous attempts? no    Mania/hypomania:  denies     Psychosis:  denies    Abuse/Trauma History:  denies  PTSD symptoms: na      Mental Status  Interactions:    Active   Attention:   Good  Memory:   Intact  Speech:   Normal   Flow of Thought:  Normal  Thought Content:  WNL  Orientation:   person, place and situation  Judgment:   Good  Affect/Mood:   Appropriate  Insight:   Good        Medical History:    Past Medical History  Diagnosis Date  . Depression   . Renal disorder   . Renal calculi   . Ligament tear of upper extremity April 2008    Left elbow  . Diverticulitis 2013  .  CRPS (complex regional pain syndrome), upper limb   . Nephrolithiasis   . Allergy   . Anxiety   . Glaucoma   . GERD (gastroesophageal reflux disease)   . Hypertension     no per pt  . Hyperlipidemia   . Intestinal metaplasia of gastric mucosa   . History of Helicobacter pylori infection   . Adenomatous colon polyp   . Diverticulosis      Current medications:         Outpatient Encounter Prescriptions as of 03/26/2015  Medication Sig  . busPIRone (BUSPAR) 10 MG tablet Take 1 tablet (10 mg total) by mouth 2 (two) times daily.  [name] gabapentin (NEURONTIN) 600 MG tablet 600 mg 3 (three) times daily.   [name] LUMIGAN 0.01 % SOLN   . metroNIDAZOLE (FLAGYL) 500 MG tablet Take 1 tablet (500 mg total) by mouth 3 (three) times daily.  . Omega-3 Fatty Acids (FISH OIL PO) Take by mouth daily.  . sertraline (ZOLOFT) 100 MG tablet TAKE 1 TABLET (100 MG TOTAL) BY MOUTH DAILY.  . traZODone (DESYREL) 50 MG tablet Take 1 tablet (50 mg total) by mouth at bedtime.  . VOLTAREN 1 % GEL    No facility-administered encounter medications on file as of 03/26/2015.              Mental Health/Substance Use Treatment History:  Saw a therapist a few years ago Did about 4 sessions of marriage counseling First saw a psychiatrist 8-10 years ago     Family Med/Psych History:  Family History  Problem Relation Age of Onset  . Lung cancer Father   . Kidney disease Mother   . Colon cancer Neg Hx   . Esophageal cancer Neg Hx   . Rectal cancer Neg Hx   . Stomach cancer Neg Hx        Substance Use History:   Alcohol- occassional use, a couple beers per week Tobacco- quit smoking "a long time ago"     Marital Status: Married for 12 years to [place].  They have been together for over 20 years.  She has 3 children from a previous marriage.  Lost one son in a car accident 4 years ago.  He was 22.   She works for an Proof[employer]. She has bipolar disorder and OCD.    Married to first  wife for 25 years.  Separated for 5 years before divorcing.     Lives with: son and wife  One week with one twin and next week with the other twin granddaughters (6)  When not with them they are with their aunt, Nira Conn ([place] Etienne daughter).   Wife stays attached to the girls.    Family Relationships:   Together they have a son, Marcello Moores (11)   She has 2 daughters who live in [place] (30)   He believes she has a lot of influence on his wife. [place] (27)   Not in a relationship but has 4 children.  Wife often ends up doing things for her.  Moved in with the family when she was pregnant with twins.  They moved her into an apartment.  Wife moved out with son.  He didn't see Marcello Moores for a month.  Wife returned after 5 months.  This was about 3 years ago.  Found out [place] was using drugs.  Reported to CPS.  The children were taken away from her.  [place] is angry with mom and Heather.       He has 2 daughters and a son- live in [place], [place] and all are married with children.  Come to visit about twice a year.  They talk on the phone frequently.   Lourdes [phone]) Tony (65) Yomira (28)    Sister lives in [place].   Brother lives in [place].  Grew up in [place].  Dad died from lung cancer when he was 3.  Mom died from a kidney disease when he was 32.  Raised by aunt and uncle. Came to the [place] in 1976 with his first wife.     Other Social Supports: Has some friends.  They hang out occasionally.  They are not aware of his family problems.    Current Employment: On disability for 8 years.  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  Experienced nerve damage.    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    Education:   Some college   Legal History:  none  Military Involvement: none  Religion/Spirituality:  Has become more involved in church in past 3-4 years  [place]   Wife does not attend.  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  Hobbies:  Painting  Strengths/Protective Factors: "People say I'm intelligent, smart"        Impression/DX: F33.1 Major Depressive Disorder, recurrent, moderate with anxious  distress   Disposition/Plan: Recommending individual therapy with a focus on decreasing symptoms of depression and anxiety.  Interventions will include helping patient identify and change negative or irrational thinking patterns and teaching skills for interpersonal effectiveness and stress management.

## 2015-04-15 ENCOUNTER — Ambulatory Visit (HOSPITAL_COMMUNITY): Payer: Self-pay | Admitting: Licensed Clinical Social Worker

## 2015-04-30 ENCOUNTER — Ambulatory Visit (INDEPENDENT_AMBULATORY_CARE_PROVIDER_SITE_OTHER): Payer: Medicare Other | Admitting: Licensed Clinical Social Worker

## 2015-04-30 DIAGNOSIS — F331 Major depressive disorder, recurrent, moderate: Secondary | ICD-10-CM | POA: Diagnosis not present

## 2015-04-30 NOTE — Progress Notes (Signed)
   THERAPIST PROGRESS NOTE  Session Time: 10:55am-11:50am  Participation Level: Active  Behavioral Response: CasualAlertDysphoric  Type of Therapy: Individual Therapy  Treatment Goals addressed: coping with family stress  Interventions: CBT and mindfulness  Suicidal/Homicidal: Denied both  Therapist Interventions: Had patient complete a PHQ-9 and GAD-7 to assess for severity of depression and anxiety.  Reviewed results.   Discussed the idea that getting stuck on unhelpful thoughts contributes to experiencing chronic feelings of depression and/or anxiety.  Noted that one method for coping with having unhelpful thoughts is to work on changing those messages to be more positive or realistic. Introduced the concept of mindfulness.  Emphasized how learning to focus on the present can help you to feel more in control of your emotions.  Explained how it can be useful to practice at times when you catch yourself having unhelpful thoughts.   Explored feelings of resentment patient has towards his wife's daughters ever since a particular incident when he felt disrespected. Provided patient with some material to read about mindfulness for homework.  Summary: PHQ-9 score was 11 (moderate).  This is the same score as last month. GAD-7 score was 14 (moderately severe).  This is an increase in severity/frequency of symptoms.   Continues to have concerns about his wife acting in ways they reinforce dependency with her daughters.  Indicated that he has communicated these concerns but his wife has not validated those concerns or made any attempts to change her behavior.  She has told him she "doesn't know how to do any different."   Recounted an interaction he had with his wife's daughters approximately 3 years ago.  Interpreted their behavior as disrespectful.  Said he had felt humiliated.   Acknowledged he spends a lot of time dwelling on the past or worrying about the future.  Tries to distract himself  from these thoughts by watching TV, painting, or going to a friend's house to help out.   Agreed to read for homework.   Plan: Return again in 2-3 weeks.    Diagnosis: F33.1 Major Depressive Disorder, recurrent, moderate with anxious distress   Armandina Stammer 04/30/2015

## 2015-05-06 ENCOUNTER — Encounter (HOSPITAL_COMMUNITY): Payer: Self-pay | Admitting: Psychiatry

## 2015-05-06 ENCOUNTER — Ambulatory Visit (INDEPENDENT_AMBULATORY_CARE_PROVIDER_SITE_OTHER): Payer: Medicare Other | Admitting: Psychiatry

## 2015-05-06 VITALS — HR 80 | Ht 66.0 in | Wt 182.0 lb

## 2015-05-06 DIAGNOSIS — F411 Generalized anxiety disorder: Secondary | ICD-10-CM | POA: Diagnosis not present

## 2015-05-06 DIAGNOSIS — F331 Major depressive disorder, recurrent, moderate: Secondary | ICD-10-CM

## 2015-05-06 DIAGNOSIS — F5102 Adjustment insomnia: Secondary | ICD-10-CM | POA: Diagnosis not present

## 2015-05-06 MED ORDER — SERTRALINE HCL 100 MG PO TABS: ORAL_TABLET | ORAL | Status: DC

## 2015-05-06 MED ORDER — TRAZODONE HCL 50 MG PO TABS: 50.0000 mg | ORAL_TABLET | Freq: Every day | ORAL | Status: DC

## 2015-05-06 MED ORDER — BUSPIRONE HCL 10 MG PO TABS: 10.0000 mg | ORAL_TABLET | Freq: Two times a day (BID) | ORAL | Status: DC

## 2015-05-06 NOTE — Progress Notes (Signed)
Patient ID: Nathan [name], male   DOB: [birth year], 64 y.o.   MRN: [id number]   [place] Follow-up Outpatient Visit  Avante Carneiro 06/22/1951  Date: 12/02/2014  History of Chief Complaint:   HPI Comments: Mr. Ferrone is a 64 y/o male with a past psychiatric history significant for Major Depressive Disorder. GAD, insomnia.  The patient is referred for psychiatric services for medication management.   GAD: still worries mostly of his wife and her daughter. buspar helps some . He takes irregularly   Keeps busy with his 48 yr old son. Other 3 kids are grown. Addition of another grand daughter twin daughters one at a time has effected family dynameics. He standing to start counseling to work on family dynamics In the area of affective symptoms, patient appears somewhat dysthymic and worriful. Patient denies current suicidal ideation, intent, or plan. Patient denies current homicidal ideation, intent, or plan. Patient denies auditory hallucinations. Patient denies visual hallucinations. Patient denies symptoms of paranoia. Patient states sleep has been effected and is poor.[name] Appetite is good. Energy level is fair. Patient denies symptoms of anhedonia. Patient denies hopelessness, helplessness, or guilt.  Adding zoloft last visit for insomnia has helped.  . Severity: Depression: 6/10 (0=Very depressed; 5=Neutral; 10=Very Happy) . Mood has improved considering case has been settled.  Anxiety- 5/10 (0=no anxiety; 5= moderate/tolerable anxiety; 10= panic attacks)   . Duration: Patient reports he has suffered from depression for the past 7 years.  . Timing: Mood is worse in the evening. Waxing and waning throughout the years.  . Context: Ongoing court case.  . Modifying factors: Mood improves with spending time with family. Aggravating factor; recent change in family dyanmics.    Review of Systems  Constitutional: Negative.   Cardiovascular: Negative for chest pain.  Skin:  Negative for rash.  Neurological: Negative for tingling and tremors.  Psychiatric/Behavioral: The patient is nervous/anxious.       Filed Vitals:   05/06/15 1038  Pulse: 80  Height: 5\' 6"  (1.676 m)  Weight: 182 lb (82.555 kg)   Physical Exam  Vitals reviewed.  Constitutional: He appears well-developed and well-nourished. No distress.  Skin: He is not diaphoretic.  Musculoskeletal: Strength & Muscle Tone: within normal limits Gait & Station: normal Patient leans: N/A     Past Medical History: Reviewed  Past Medical History  Diagnosis Date  . Depression   . Renal disorder   . Renal calculi   . Ligament tear of upper extremity April 2008    Left elbow  . Diverticulitis 2013  . CRPS (complex regional pain syndrome), upper limb   . Nephrolithiasis   . Allergy   . Anxiety   . Glaucoma   . GERD (gastroesophageal reflux disease)   . Hypertension     no per pt  . Hyperlipidemia   . Intestinal metaplasia of gastric mucosa   . History of Helicobacter pylori infection   . Adenomatous colon polyp   . Diverticulosis     History of Loss of Consciousness: [name] a panic attack in 1988  Seizure History: No  Cardiac History: No   Allergies: Reviewed  Allergies  Allergen Reactions  . Adhesive [Tape]     Hives on area wear tape applied  . Hydrocodone-Acetaminophen Itching  . Hydrocodone Rash   Current Medications: Reviewed  Current Outpatient Prescriptions on File Prior to Visit  Medication Sig Dispense Refill  . gabapentin (NEURONTIN) 600 MG tablet 600 mg 3 (three) times daily.     [name]  LUMIGAN 0.01 % SOLN     . metroNIDAZOLE (FLAGYL) 500 MG tablet Take 1 tablet (500 mg total) by mouth 3 (three) times daily. 21 tablet 0  . Omega-3 Fatty Acids (FISH OIL PO) Take by mouth daily.    . VOLTAREN 1 % GEL   5   No current facility-administered medications on file prior to visit.      Family History: Reviewed  Family History  Problem Relation Age of Onset  . Lung  cancer Father   . Kidney disease Mother   . Colon cancer Neg Hx   . Esophageal cancer Neg Hx   . Rectal cancer Neg Hx   . Stomach cancer Neg Hx     Psychiatric specialty examination:  Objective: Appearance: Casual and Well Groomed   Eye Contact:: Good   Speech: Clear and Coherent and Normal Rate   Volume: Normal   Mood: somewhat worriful   Affect: Appropriate and Congruent   Thought Process: Coherent, Logical and Loose   Orientation: Full   Thought Content: WDL   Suicidal Thoughts: No   Homicidal Thoughts: No   Judgement: Fair   Insight: Fair   Psychomotor Activity: Normal   Akathisia: No   Handed: Right   Memory: Immediate 3/3, recent 3/3   Concentration: Good   AIMS (if indicated): Not Indicated   Assets: Surveyor, mining  Housing  Social Support    Assessment:  AXIS I  Major Depressive Disorder, recurrent, moderate. GAD. Insomnia   Treatment Plan/Recommendations:  Plan of Care:  PLAN:  1. Affirm with the patient that the medications are taken as ordered. Patient  expressed understanding of how their medications were to be used.    Laboratory:  No labs warranted at this time.    Psychotherapy: Therapy: brief supportive therapy provided.  Discussed psychosocial stressors in detail.    Medications:  Continue  the following psychiatric medications as written prior to this appointment with the following changes: Continue Zoloft 100 mg Continue buspirone for anxiety  trazadone 50mg  for sleep. Prescriptions written.  -Risks and benefits, side effects and alternatives discussed with patient, he was given an opportunity to ask questions about his medication, illness, and treatment. All current psychiatric medications have been reviewed and discussed with the patient and adjusted as clinically appropriate. The patient has been provided an accurate and updated list of the medications being now prescribed.  Also to consider therapy if  needed for anxiety.  Routine PRN Medications:  Negative  Consultations: The patient was encouraged to keep all PCP and specialty clinic appointments.   Safety Concerns:   Patient told to call clinic if any problems occur. Patient advised to go to  ER  if he should develop SI/HI, side effects, or if symptoms worsen. Has crisis numbers to call if needed.    Other:   8. Patient was instructed to return to clinic in 2  months.  9. The patient was advised to call and cancel their mental health appointment within 24 hours of the appointment, if they are unable to keep the appointment, as well as the three no show and termination from clinic policy. 10. The patient expressed understanding of the plan and agrees with the above.   [name], MD  05/06/2015 10:58 AM

## 2015-05-14 DIAGNOSIS — Z96651 Presence of right artificial knee joint: Secondary | ICD-10-CM | POA: Diagnosis not present

## 2015-05-21 ENCOUNTER — Ambulatory Visit (INDEPENDENT_AMBULATORY_CARE_PROVIDER_SITE_OTHER): Payer: Medicare Other | Admitting: Licensed Clinical Social Worker

## 2015-05-21 DIAGNOSIS — F331 Major depressive disorder, recurrent, moderate: Secondary | ICD-10-CM | POA: Diagnosis not present

## 2015-05-21 NOTE — Progress Notes (Signed)
   THERAPIST PROGRESS NOTE  Session Time: 9:00am-9:55am  Participation Level: Active  Behavioral Response: Casual Alert Dysphoric  Type of Therapy: Individual Therapy  Treatment Goals addressed: coping with family stress  Interventions: mindfulness and solution focused  Suicidal/Homicidal: Denied both  Therapist Interventions:  Explored how patient has started to practice mindfulness thinking.   Discussed how wife has continued to demonstrate codependent behavior.  Outlined some characteristics of individuals who are codependent.   Asked patient to describe how family stress has been affecting their son.       .  Summary:  Reported that he did complete the reading about mindfulness.  Noted that it is something he will need to continue to work on.  Described how it was difficult to implement when he got stuck on thoughts about his wife encouraging dependence with her adult daughter. Reported he was familiar with the term codependent and that it was an accurate label for his wife.  Wishes she would be willing to see a therapist.  Noted they had seen one together a few years ago, but wife wanted to quit after about 4 sessions after the therapist had pointed out her enabling. Says he is considering having his son see a therapist.  Concerned that he spends most of his time in his room, hasn't been playing with friends, and he seems to have lost his appetite.            Plan: Return again in 2-3 weeks.    Diagnosis: F33.1 Major Depressive Disorder, recurrent, moderate with anxious distress   Armandina Stammer 05/21/2015

## 2015-06-01 ENCOUNTER — Ambulatory Visit (INDEPENDENT_AMBULATORY_CARE_PROVIDER_SITE_OTHER): Payer: Medicare Other

## 2015-06-01 ENCOUNTER — Ambulatory Visit (INDEPENDENT_AMBULATORY_CARE_PROVIDER_SITE_OTHER): Payer: Medicare Other | Admitting: Family Medicine

## 2015-06-01 ENCOUNTER — Encounter: Payer: Self-pay | Admitting: Family Medicine

## 2015-06-01 VITALS — BP 115/66 | HR 78 | Resp 98 | Ht 65.25 in | Wt 184.0 lb

## 2015-06-01 DIAGNOSIS — R7301 Impaired fasting glucose: Secondary | ICD-10-CM | POA: Diagnosis not present

## 2015-06-01 DIAGNOSIS — Z0000 Encounter for general adult medical examination without abnormal findings: Secondary | ICD-10-CM | POA: Diagnosis not present

## 2015-06-01 DIAGNOSIS — Z114 Encounter for screening for human immunodeficiency virus [HIV]: Secondary | ICD-10-CM | POA: Diagnosis not present

## 2015-06-01 DIAGNOSIS — Z1159 Encounter for screening for other viral diseases: Secondary | ICD-10-CM | POA: Diagnosis not present

## 2015-06-01 DIAGNOSIS — E785 Hyperlipidemia, unspecified: Secondary | ICD-10-CM | POA: Diagnosis not present

## 2015-06-01 DIAGNOSIS — M25511 Pain in right shoulder: Secondary | ICD-10-CM | POA: Diagnosis not present

## 2015-06-01 DIAGNOSIS — Z125 Encounter for screening for malignant neoplasm of prostate: Secondary | ICD-10-CM

## 2015-06-01 LAB — POCT GLYCOSYLATED HEMOGLOBIN (HGB A1C): Hemoglobin A1C: 6.1

## 2015-06-01 NOTE — Progress Notes (Signed)
Subjective:    Nathan [name] is a 64 y.o. male who presents for Medicare Annual/Subsequent preventive examination.   Preventive Screening-Counseling & Management  Tobacco History  Smoking status  . Former Smoker -- 1.00 packs/day for 10 years  . Types: Cigarettes  . Quit date: 09/25/1981  Smokeless tobacco  . Never Used    Problems Prior to Visit 1. Feel while mowing the grass and reached his arm out and felt a jarring sensation in his shoulder. Has been painful ever since them. Hard to sleep on that shoulder. Using naproxen 500mg  and helps some. Also using his volteran gel on it.  Has normal ROM. Occ feels week.    Current Problems (verified) Patient Active Problem List   Diagnosis Date Noted  . IFG (impaired fasting glucose) 05/27/2014  . Major depressive disorder, recurrent 04/12/2012  . CAUSALGIA OF UPPER LIMB 08/02/2010  . HYPERTENSION, BENIGN, MILD 08/02/2010  . ANEMIA 05/11/2010  . NEUROPATHY 08/10/2009  . NEPHROLITHIASIS 05/22/2009  . GERD 06/23/2008  . ALLERGIC RHINITIS 01/14/2008  . Hyperlipidemia 06/28/2007  . FATTY LIVER DISEASE 06/28/2007  . DISEASE, CYSTIC KIDNEY NOS, CONGENITAL 06/28/2007  . MDD (major depressive disorder) 06/17/2007    Medications Prior to Visit Current Outpatient Prescriptions on File Prior to Visit  Medication Sig Dispense Refill  . busPIRone (BUSPAR) 10 MG tablet Take 1 tablet (10 mg total) by mouth 2 (two) times daily. 60 tablet 1  . gabapentin (NEURONTIN) 600 MG tablet 600 mg 3 (three) times daily.     [name] LUMIGAN 0.01 % SOLN     . Omega-3 Fatty Acids (FISH OIL PO) Take by mouth daily.    . sertraline (ZOLOFT) 100 MG tablet TAKE 1 TABLET (100 MG TOTAL) BY MOUTH DAILY. 30 tablet 1  . traZODone (DESYREL) 50 MG tablet Take 1 tablet (50 mg total) by mouth at bedtime. 30 tablet 1  . VOLTAREN 1 % GEL   5   No current facility-administered medications on file prior to visit.    Current Medications (verified) Current Outpatient  Prescriptions  Medication Sig Dispense Refill  . busPIRone (BUSPAR) 10 MG tablet Take 1 tablet (10 mg total) by mouth 2 (two) times daily. 60 tablet 1  . gabapentin (NEURONTIN) 600 MG tablet 600 mg 3 (three) times daily.     [name] LUMIGAN 0.01 % SOLN     . Omega-3 Fatty Acids (FISH OIL PO) Take by mouth daily.    . sertraline (ZOLOFT) 100 MG tablet TAKE 1 TABLET (100 MG TOTAL) BY MOUTH DAILY. 30 tablet 1  . traZODone (DESYREL) 50 MG tablet Take 1 tablet (50 mg total) by mouth at bedtime. 30 tablet 1  . VOLTAREN 1 % GEL   5   No current facility-administered medications for this visit.     Allergies (verified) Adhesive; Hydrocodone-acetaminophen; and Hydrocodone   PAST HISTORY  Family History Family History  Problem Relation Age of Onset  . Lung cancer Father   . Kidney disease Mother   . Colon cancer Neg Hx   . Esophageal cancer Neg Hx   . Rectal cancer Neg Hx   . Stomach cancer Neg Hx     Social History Social History  Substance Use Topics  . Smoking status: Former Smoker -- 1.00 packs/day for 10 years    Types: Cigarettes    Quit date: 09/25/1981  . Smokeless tobacco: Never Used  . Alcohol Use: 0.6 oz/week    1 Cans of beer per week     Comment:  1-2 per wk    Are there smokers in your home (other than you)?  No  Risk Factors Current exercise habits: walking treadmill occ  Dietary issues discussed: None   Cardiac risk factors: advanced age (older than 67 for men, 3 for women), male gender, obesity (BMI >= 30 kg/m2) and sedentary lifestyle.  Depression Screen (Note: if answer to either of the following is "Yes", a more complete depression screening is indicated)   Q1: Over the past two weeks, have you felt down, depressed or hopeless? No  Q2: Over the past two weeks, have you felt little interest or pleasure in doing things? No  Have you lost interest or pleasure in daily life? No  Do you often feel hopeless? No  Do you cry easily over simple problems?  Yes  Activities of Daily Living In your present state of health, do you have any difficulty performing the following activities?:  Driving? No Managing money?  No Feeding yourself? No Getting from bed to chair? No Climbing a flight of stairs? No Preparing food and eating?: No Bathing or showering? No Getting dressed: No Getting to the toilet? No Using the toilet:No Moving around from place to place: No In the past year have you fallen or had a near fall?:No   Are you sexually active?  Yes  Do you have more than one partner?  No  Hearing Difficulties: No Do you often ask people to speak up or repeat themselves? No Do you experience ringing or noises in your ears? No Do you have difficulty understanding soft or whispered voices? No   Do you feel that you have a problem with memory? No  Do you often misplace items? No  Do you feel safe at home?  Yes  Cognitive Testing  Alert? Yes  Normal Appearance?Yes  Oriented to person? Yes  Place? Yes   Time? Yes  Recall of three objects?  Yes  Can perform simple calculations? Yes  Displays appropriate judgment?Yes  Can read the correct time from a watch face?Yes   Advanced Directives have been discussed with the patient? No   List the Names of Other Physician/Practitioners you currently use: 1.    Indicate any recent Medical Services you may have received from other than [facility] providers in the past year (date may be approximate).  Immunization History  Administered Date(s) Administered  . Influenza Whole 06/25/2009  . Td 06/17/2007    Screening Tests Health Maintenance  Topic Date Due  . Hepatitis C Screening  April 01, 1951  . HIV Screening  07/01/1966  . ZOSTAVAX  07/02/2011  . INFLUENZA VACCINE  05/31/2016 (Originally 04/26/2015)  . TETANUS/TDAP  06/16/2017  . COLONOSCOPY  02/01/2020    All answers were reviewed with the patient and necessary referrals were made:  Harsha Yusko, MD   06/01/2015   History reviewed:  allergies, current medications, past family history, past medical history, past social history, past surgical history and problem list  Review of Systems A comprehensive review of systems was negative except for: see note above    Objective:     Vision by Snellen chart:  Sees eye doc. Will call for recent report.    Blood pressure 115/66, pulse 78, resp. rate 98, height 5' 5.25" (1.657 m), weight 184 lb (83.462 kg).  Body mass index is 30.4 kg/(m^2).  BP 115/66 mmHg  Pulse 78  Resp 98  Ht 5' 5.25" (1.657 m)  Wt 184 lb (83.462 kg)  BMI 30.40 kg/m2 General appearance: alert, cooperative  and appears stated age Head: Normocephalic, without obvious abnormality, atraumatic Eyes: conj clear, EOMI, PEERLA Ears: normal TM's and external ear canals both ears Nose: Nares normal. Septum midline. Mucosa normal. No drainage or sinus tenderness. Throat: lips, mucosa, and tongue normal; teeth and gums normal Neck: no adenopathy, no carotid bruit, no JVD, supple, symmetrical, trachea midline and thyroid not enlarged, symmetric, no tenderness/mass/nodules Back: symmetric, no curvature. ROM normal. No CVA tenderness. Lungs: clear to auscultation bilaterally Chest wall: no tenderness Heart: regular rate and rhythm, S1, S2 normal, no murmur, click, rub or gallop Abdomen: soft, non-tender; bowel sounds normal; no masses,  no organomegaly Extremities: extremities normal, atraumatic, no cyanosis or edema Pulses: 2+ and symmetric Skin: Skin color, texture, turgor normal. No rashes or lesions Lymph nodes: Cervical adenopathy: nl and Axillary adenopathy: nl Neurologic: Alert and oriented X 3, normal strength and tone. Normal symmetric reflexes. Normal coordination and gait    Right shoulder with NROM.  Pos empty can test on the right tender over the lateral edge and postrior edge. nontender over the clavicle.  Shoulder and elbow strength is 5/5.     Assessment:     Annual Medicare Wellness Exam         Plan:     During the course of the visit the patient was educated and counseled about appropriate screening and preventive services including:    Influenza vaccine  Prostate cancer screening  Advanced directives: has NO advanced directive - not interested in additional information   Right shoulder pain - Possible rotator cuff tear.  He has NROM but pos empty can test.  Will start with xray since did have a fall/injury.  Work on exercise rotator cuff.  Consider MRI.  Has been using NSAIDs.   Feels his pain is getting worse.    Diet review for nutrition referral? Yes ____  Not Indicated _X__   Patient Instructions (the written plan) was given to the patient.  Medicare Attestation I have personally reviewed: The patient's medical and social history Their use of alcohol, tobacco or illicit drugs Their current medications and supplements The patient's functional ability including ADLs,fall risks, home safety risks, cognitive, and hearing and visual impairment Diet and physical activities Evidence for depression or mood disorders  The patient's weight, height, BMI, and visual acuity have been recorded in the chart.  I have made referrals, counseling, and provided education to the patient based on review of the above and I have provided the patient with a written personalized care plan for preventive services.     Cimone Fahey, MD   06/01/2015

## 2015-06-02 LAB — COMPLETE METABOLIC PANEL WITH GFR
ALT: 28 U/L (ref 9–46)
AST: 23 U/L (ref 10–35)
Albumin: 4.2 g/dL (ref 3.6–5.1)
Alkaline Phosphatase: 104 U/L (ref 40–115)
BUN: 15 mg/dL (ref 7–25)
CO2: 28 mmol/L (ref 20–31)
Calcium: 9.1 mg/dL (ref 8.6–10.3)
Chloride: 102 mmol/L (ref 98–110)
Creat: 0.83 mg/dL (ref 0.70–1.25)
GFR, Est African American: >89 mL/min (ref >=60)
GFR, Est Non African American: >89 mL/min (ref >=60)
Glucose, Bld: 112 mg/dL — ABNORMAL HIGH (ref 65–99)
Potassium: 4.3 mmol/L (ref 3.5–5.3)
Sodium: 140 mmol/L (ref 135–146)
Total Bilirubin: 0.9 mg/dL (ref 0.2–1.2)
Total Protein: 6.9 g/dL (ref 6.1–8.1)

## 2015-06-03 LAB — HIV ANTIBODY (ROUTINE TESTING W REFLEX): HIV 1&2 Ab, 4th Generation: NONREACTIVE

## 2015-06-03 LAB — PSA: PSA: 1.67 ng/mL (ref <=4.00)

## 2015-06-03 LAB — HEPATITIS C ANTIBODY: HCV Ab: NEGATIVE

## 2015-06-03 MED ORDER — NABUMETONE 500 MG PO TABS: 500.0000 mg | ORAL_TABLET | Freq: Two times a day (BID) | ORAL | Status: DC | PRN

## 2015-06-07 ENCOUNTER — Other Ambulatory Visit: Payer: Self-pay

## 2015-06-14 ENCOUNTER — Ambulatory Visit (INDEPENDENT_AMBULATORY_CARE_PROVIDER_SITE_OTHER): Payer: Medicare Other

## 2015-06-14 DIAGNOSIS — M25511 Pain in right shoulder: Secondary | ICD-10-CM

## 2015-06-14 DIAGNOSIS — S46011A Strain of muscle(s) and tendon(s) of the rotator cuff of right shoulder, initial encounter: Secondary | ICD-10-CM

## 2015-06-14 DIAGNOSIS — M75121 Complete rotator cuff tear or rupture of right shoulder, not specified as traumatic: Secondary | ICD-10-CM | POA: Diagnosis not present

## 2015-06-14 DIAGNOSIS — X58XXXA Exposure to other specified factors, initial encounter: Secondary | ICD-10-CM | POA: Diagnosis not present

## 2015-06-17 ENCOUNTER — Ambulatory Visit (HOSPITAL_COMMUNITY): Payer: Medicare Other | Admitting: Licensed Clinical Social Worker

## 2015-06-21 ENCOUNTER — Encounter: Payer: Self-pay | Admitting: Family Medicine

## 2015-06-21 DIAGNOSIS — M75101 Unspecified rotator cuff tear or rupture of right shoulder, not specified as traumatic: Secondary | ICD-10-CM

## 2015-07-06 DIAGNOSIS — M75121 Complete rotator cuff tear or rupture of right shoulder, not specified as traumatic: Secondary | ICD-10-CM | POA: Diagnosis not present

## 2015-07-15 ENCOUNTER — Ambulatory Visit (INDEPENDENT_AMBULATORY_CARE_PROVIDER_SITE_OTHER): Payer: Medicare Other | Admitting: Psychiatry

## 2015-07-15 ENCOUNTER — Encounter (HOSPITAL_COMMUNITY): Payer: Self-pay | Admitting: Psychiatry

## 2015-07-15 VITALS — Ht 66.0 in | Wt 179.0 lb

## 2015-07-15 DIAGNOSIS — F331 Major depressive disorder, recurrent, moderate: Secondary | ICD-10-CM

## 2015-07-15 DIAGNOSIS — F411 Generalized anxiety disorder: Secondary | ICD-10-CM

## 2015-07-15 DIAGNOSIS — F5102 Adjustment insomnia: Secondary | ICD-10-CM

## 2015-07-15 MED ORDER — BUSPIRONE HCL 10 MG PO TABS: 10.0000 mg | ORAL_TABLET | Freq: Two times a day (BID) | ORAL | Status: DC

## 2015-07-15 MED ORDER — TRAZODONE HCL 50 MG PO TABS: 50.0000 mg | ORAL_TABLET | Freq: Every day | ORAL | Status: DC

## 2015-07-15 MED ORDER — SERTRALINE HCL 100 MG PO TABS: ORAL_TABLET | ORAL | Status: DC

## 2015-07-15 NOTE — Progress Notes (Signed)
Patient ID: Nathan [name], male   DOB: 1951/04/27, 64 y.o.   MRN: [id number]   [place] Follow-up Outpatient Visit  Anes Rigel 03-Feb-1951  Date: 07/15/2015  History of Chief Complaint:   HPI Comments: Nathan [name] is a 64 y/o male with a past psychiatric history significant for Major Depressive Disorder. GAD, insomnia.  The patient is referred for psychiatric services for medication management.   GAD: still worries mostly of his wife and her daughter. buspar helps some . He takes irregularly    Mother in law passed away that has effected his wife but she is adjusting. Endorses stress at times.  Trazadone helps sleep, zoloft with depression No significant concern or dysfunction . Severity: Depression: 6/10 (0=Very depressed; 5=Neutral; 10=Very Happy) . Mood has improved considering case has been settled.  Anxiety- 5/10 (0=no anxiety; 5= moderate/tolerable anxiety; 10= panic attacks)   . Duration: Patient reports he has suffered from depression for the past 7 years.  . Timing: Mood is worse in the evening. Waxing and waning throughout the years.  . Context: Ongoing court case.  . Modifying factors: Mood improves with spending time with family. Aggravating factor; recent change in family dyanmics.    Review of Systems  Constitutional: Negative.   Cardiovascular: Negative for chest pain.  Skin: Negative for rash.  Neurological: Negative for tingling and tremors.  Psychiatric/Behavioral: Negative for suicidal ideas. The patient has insomnia.       Filed Vitals:   07/15/15 1014  Height: 5\' 6"  (1.676 m)  Weight: 179 lb (81.194 kg)   Physical Exam  Vitals reviewed.  Constitutional: He appears well-developed and well-nourished. No distress.  Skin: He is not diaphoretic.  Musculoskeletal: Strength & Muscle Tone: within normal limits Gait & Station: normal Patient leans: N/A     Past Medical History: Reviewed  Past Medical History  Diagnosis Date   . Depression   . Renal disorder   . Renal calculi   . Ligament tear of upper extremity April 2008    Left elbow  . Diverticulitis 2013  . CRPS (complex regional pain syndrome), upper limb   . Nephrolithiasis   . Allergy   . Anxiety   . Glaucoma   . GERD (gastroesophageal reflux disease)   . Hypertension     no per pt  . Hyperlipidemia   . Intestinal metaplasia of gastric mucosa   . History of Helicobacter pylori infection   . Adenomatous colon polyp   . Diverticulosis     History of Loss of Consciousness: Nathan [name] a panic attack in 1988  Seizure History: No  Cardiac History: No   Allergies: Reviewed  Allergies  Allergen Reactions  . Adhesive [Tape]     Hives on area wear tape applied  . Hydrocodone-Acetaminophen Itching  . Hydrocodone Rash   Current Medications: Reviewed  Current Outpatient Prescriptions on File Prior to Visit  Medication Sig Dispense Refill  . gabapentin (NEURONTIN) 600 MG tablet 600 mg 3 (three) times daily.     [name] LUMIGAN 0.01 % SOLN     . nabumetone (RELAFEN) 500 MG tablet Take 1 tablet (500 mg total) by mouth 2 (two) times daily as needed for moderate pain. 60 tablet 1  . Omega-3 Fatty Acids (FISH OIL PO) Take by mouth daily.    . VOLTAREN 1 % GEL   5   No current facility-administered medications on file prior to visit.      Family History: Reviewed  Family History  Problem Relation Age of  Onset  . Lung cancer Father   . Kidney disease Mother   . Colon cancer Neg Hx   . Esophageal cancer Neg Hx   . Rectal cancer Neg Hx   . Stomach cancer Neg Hx     Psychiatric specialty examination:  Objective: Appearance: Casual and Well Groomed   Eye Contact:: Good   Speech: Clear and Coherent and Normal Rate   Volume: Normal   Mood: somewhat worriful   Affect: Appropriate and Congruent   Thought Process: Coherent, Logical and Loose   Orientation: Full   Thought Content: WDL   Suicidal Thoughts: No   Homicidal Thoughts: No   Judgement:  Fair   Insight: Fair   Psychomotor Activity: Normal   Akathisia: No   Handed: Right   Memory: Immediate 3/3, recent 3/3   Concentration: Good   AIMS (if indicated): Not Indicated   Assets: Surveyor, mining  Housing  Social Support    Assessment:  AXIS I  Major Depressive Disorder, recurrent, moderate. GAD. Insomnia   Treatment Plan/Recommendations:  Plan of Care:  PLAN:  1. Affirm with the patient that the medications are taken as ordered. Patient  expressed understanding of how their medications were to be used.    Laboratory:  No labs warranted at this time.    Psychotherapy: Therapy: brief supportive therapy provided.  Discussed psychosocial stressors in detail.    Medications:  Continue  the following psychiatric medications as written prior to this appointment with the following changes: Continue Zoloft 100 mg Continue buspirone for anxiety  trazadone 50mg  for sleep. Has trazadone. Other prescriptions sent   -Risks and benefits, side effects and alternatives discussed with patient, he was given an opportunity to ask questions about his medication, illness, and treatment. All current psychiatric medications have been reviewed and discussed with the patient and adjusted as clinically appropriate. The patient has been provided an accurate and updated list of the medications being now prescribed.  Also to consider therapy if needed for anxiety.  Routine PRN Medications:  Negative  Consultations: The patient was encouraged to keep all PCP and specialty clinic appointments.   Safety Concerns:   Patient told to call clinic if any problems occur. Patient advised to go to  ER  if he should develop SI/HI, side effects, or if symptoms worsen. Has crisis numbers to call if needed.    Other:   8. Patient was instructed to return to clinic in 2 to 3 months months.  9. The patient was advised to call and cancel their mental health appointment within 24  hours of the appointment, if they are unable to keep the appointment, as well as the three no show and termination from clinic policy. 10. The patient expressed understanding of the plan and agrees with the above.   [name], MD  07/15/2015 10:18 AM

## 2015-07-20 DIAGNOSIS — H401133 Primary open-angle glaucoma, bilateral, severe stage: Secondary | ICD-10-CM | POA: Diagnosis not present

## 2015-07-21 ENCOUNTER — Ambulatory Visit (INDEPENDENT_AMBULATORY_CARE_PROVIDER_SITE_OTHER): Payer: Medicare Other | Admitting: Family Medicine

## 2015-07-21 ENCOUNTER — Encounter: Payer: Self-pay | Admitting: Family Medicine

## 2015-07-21 VITALS — BP 134/75 | HR 76 | Ht 66.0 in | Wt 182.0 lb

## 2015-07-21 DIAGNOSIS — L723 Sebaceous cyst: Secondary | ICD-10-CM | POA: Diagnosis not present

## 2015-07-21 DIAGNOSIS — L280 Lichen simplex chronicus: Secondary | ICD-10-CM | POA: Diagnosis not present

## 2015-07-21 NOTE — Progress Notes (Signed)
   Subjective:    Patient ID: Nathan [name], male    DOB: July 11, 1951, 64 y.o.   MRN: [id number]  HPI Here today to remove a bump from his neck. He noticed a bump a couple weeks ago on the right side of his neck. He says it became tender and painful and his wife squeezed on it and got some pus out of it. He has had a cyst removed before on the right side of his neck. He says is still tender when he presses on it. No fevers chills or sweats.   Review of Systems     Objective:   Physical Exam  Constitutional: He is oriented to person, place, and time. He appears well-developed and well-nourished.  HENT:  Head: Normocephalic and atraumatic.  Neurological: He is alert and oriented to person, place, and time.  Skin: Skin is warm.  He has an approximately 1 cm sebaceous cyst on the right side of his neck at the hairline.  Psychiatric: He has a normal mood and affect.          Assessment & Plan:  Sebaceous cyst-recommended removal. Patient tolerated procedure well.  Sebaceous Cyst Excision Procedure Note  Pre-operative Diagnosis: Sebaceous cyst   Post-operative Diagnosis: Same  Locations:right Hairline on the neck  Indications: Pain and tenderness  Anesthesia: Lidocaine 1% with epinephrine without added sodium bicarbonate  Procedure Details  Patient informed of the risks (including bleeding and infection) and benefits of the  procedure and Verbal informed consent obtained.  The lesion and surrounding area was given a sterile prep using chlorhexidine and draped in the usual sterile fashion. An incision was made over the cyst, which was dissected free of the surrounding tissue and removed.  The cyst was filled with typical sebaceous material.  The wound was closed with 3-0 Prolene using simple interrupted stitches. Antibiotic ointment and a sterile dressing applied.  The specimen was sent for pathologic examination. The patient tolerated the procedure well.  EBL: 0.25  ml  Findings: Await pathology  Condition: Stable  Complications: none.  Plan: 1. Instructed to keep the wound dry and covered for 24-48h and clean thereafter. 2. Warning signs of infection were reviewed.   3. Recommended that the patient use OTC acetaminophen as needed for pain.  4. Return for suture removal in 10 days.

## 2015-07-21 NOTE — Patient Instructions (Signed)
Please keep the wound covered until tomorrow morning. You can change the dressing if needed sooner than that if there is a little bit of blood on it. Please cover the suture with a waterproof Band-Aid in the shower for the next 2 days. After that he can get wet. Do not scrub the area.  Apply a small dab of Vaseline after he got out of the shower. Please return in 10 days to have the suture removed.

## 2015-07-29 DIAGNOSIS — M75121 Complete rotator cuff tear or rupture of right shoulder, not specified as traumatic: Secondary | ICD-10-CM | POA: Diagnosis not present

## 2015-07-29 DIAGNOSIS — M25511 Pain in right shoulder: Secondary | ICD-10-CM | POA: Diagnosis not present

## 2015-07-30 ENCOUNTER — Ambulatory Visit (INDEPENDENT_AMBULATORY_CARE_PROVIDER_SITE_OTHER): Payer: Medicare Other | Admitting: Family Medicine

## 2015-07-30 ENCOUNTER — Encounter: Payer: Self-pay | Admitting: Family Medicine

## 2015-07-30 VITALS — BP 129/74 | HR 72

## 2015-07-30 DIAGNOSIS — Z7189 Other specified counseling: Secondary | ICD-10-CM | POA: Diagnosis not present

## 2015-07-30 DIAGNOSIS — Z23 Encounter for immunization: Secondary | ICD-10-CM

## 2015-07-30 DIAGNOSIS — Z7184 Encounter for health counseling related to travel: Secondary | ICD-10-CM

## 2015-07-30 MED ORDER — TYPHOID VACCINE PO CPDR
1.0000 | DELAYED_RELEASE_CAPSULE | ORAL | Status: DC
Start: 2015-07-30 — End: 2016-02-25

## 2015-07-30 MED ORDER — ATOVAQUONE-PROGUANIL HCL 250-100 MG PO TABS: 1.0000 | ORAL_TABLET | Freq: Every day | ORAL | Status: DC

## 2015-07-30 NOTE — Progress Notes (Signed)
   Subjective:    Patient ID: Nathan [name], male    DOB: May 05, 1951, 64 y.o.   MRN: [id number]  HPI Today for suture removal. We removed a sebaceous cyst on his upper neck on the right side. His wife has been using peroxide on it clean it over the last couple of days. He says it's been itchy.  He is also traveling to [place] in about 2 weeks and wants to know if he needs any up-to-date vaccinations. His adult vaccinations are up-to-date including his tetanus. He will be doing some hiking and visiting family. [place]  Review of Systems     Objective:   Physical Exam  Constitutional: He appears well-developed and well-nourished.  HENT:  Head: Normocephalic.  Skin: Skin is warm and dry.  incicion on neck is haling well, wound is more wet with scab absent. No active drianage or erythema.   Psychiatric: He has a normal mood and affect. His behavior is normal.          Assessment & Plan:  Sebaceous cyst-incision over all is healing well but still not completely closed. Suture removed. Instructed patient to discontinue use of any alcohol or peroxide products just apply Vaseline after gentle cleansing with soap and water in the shower.  Travel counseling-discussed CDC recommendations for travel to [place]. Will start Twinrix injections. First one given today and he will follow-up in one week for the second injection. He will not be able to get the one at 2 weeks but will need to get it when he comes back. We'll also send a prescription for oral typhoid vaccine. He does not need yellow fever based on where he is traveling. We'll also write formulary vaccination as well. Did remind him to use bug repellent while there. In the careful about what he is eating and drinking.

## 2015-08-06 ENCOUNTER — Ambulatory Visit: Payer: Self-pay | Admitting: Family Medicine

## 2015-08-09 ENCOUNTER — Ambulatory Visit (INDEPENDENT_AMBULATORY_CARE_PROVIDER_SITE_OTHER): Payer: Medicare Other | Admitting: Family Medicine

## 2015-08-09 VITALS — Temp 98.1°F

## 2015-08-09 DIAGNOSIS — Z23 Encounter for immunization: Secondary | ICD-10-CM

## 2015-08-09 NOTE — Progress Notes (Signed)
   Subjective:    Patient ID: Nathan [name], male    DOB: 04-22-1951, 64 y.o.   MRN: [id number]  HPI Patient here for 2nd in series of Hep A/B immunizations. States no adverse reaction from first dose.   Review of Systems     Objective:   Physical Exam        Assessment & Plan:  Patient tolerated injection well without complication. Advised to return for final twinrix on or about November 31, 2016.

## 2015-08-10 NOTE — Progress Notes (Signed)
He is actually on the expedited schedule. See can return on the 31st for the third injection and then will need a booster at a year.

## 2015-08-30 ENCOUNTER — Ambulatory Visit (INDEPENDENT_AMBULATORY_CARE_PROVIDER_SITE_OTHER): Payer: Medicare Other | Admitting: Family Medicine

## 2015-08-30 DIAGNOSIS — Z23 Encounter for immunization: Secondary | ICD-10-CM | POA: Diagnosis not present

## 2015-08-30 NOTE — Progress Notes (Signed)
   Subjective:    Patient ID: Nathan [name], male    DOB: [birth year], 64 y.o.   MRN: [id number]  HPI    Review of Systems     Objective:   Physical Exam        Assessment & Plan:  Here today for Twinrix injection.

## 2015-09-02 ENCOUNTER — Telehealth: Payer: Self-pay | Admitting: Internal Medicine

## 2015-09-02 NOTE — Telephone Encounter (Signed)
Discussed with pt that there are no available appts with PA and that he should call his PCP. Pt verbalized understanding.

## 2015-09-03 ENCOUNTER — Ambulatory Visit: Payer: Self-pay | Admitting: Family Medicine

## 2015-10-15 ENCOUNTER — Ambulatory Visit (INDEPENDENT_AMBULATORY_CARE_PROVIDER_SITE_OTHER): Payer: Medicare Other | Admitting: Psychiatry

## 2015-10-15 ENCOUNTER — Encounter (HOSPITAL_COMMUNITY): Payer: Self-pay | Admitting: Psychiatry

## 2015-10-15 VITALS — BP 133/80 | HR 74 | Wt 176.0 lb

## 2015-10-15 DIAGNOSIS — F411 Generalized anxiety disorder: Secondary | ICD-10-CM | POA: Diagnosis not present

## 2015-10-15 DIAGNOSIS — F331 Major depressive disorder, recurrent, moderate: Secondary | ICD-10-CM

## 2015-10-15 DIAGNOSIS — F5102 Adjustment insomnia: Secondary | ICD-10-CM

## 2015-10-15 MED ORDER — SERTRALINE HCL 100 MG PO TABS: ORAL_TABLET | ORAL | Status: DC

## 2015-10-15 MED ORDER — BUSPIRONE HCL 10 MG PO TABS: 10.0000 mg | ORAL_TABLET | Freq: Two times a day (BID) | ORAL | Status: DC

## 2015-10-15 MED ORDER — TRAZODONE HCL 50 MG PO TABS: 50.0000 mg | ORAL_TABLET | Freq: Every day | ORAL | Status: DC

## 2015-10-15 NOTE — Progress Notes (Signed)
Patient ID: Nathan [name], male   DOB: 05-Aug-1951, 65 y.o.   MRN: [id number]   [facility] Follow-up Outpatient Visit  Nivan Mckeague Oct 17, 1950  Date: 10/15/2015  History of Chief Complaint:   HPI Comments: Mr. Ditter is a 65 y/o male with a past psychiatric history significant for Major Depressive Disorder. GAD, insomnia.  The patient returns for psychiatric services for medication management.   Patient has had left hand surgery in the past and is currently on disability. Apparently he has been trying to cut down his gabapentin and is feeling a little more anxious. He was on a high dose of gabapentin. He takes buspirone but advised him to take regularly so that he can combat anxiety  Relationship with his wife going on better but she still gets worried about her other daughter's kids and that at this stressed the conflict  Endorses worries, excessive at times.  . Severity: Depression: 6/10 (0=Very depressed; 5=Neutral; 10=Very Happy) . Mood has improved considering case has been settled.  Anxiety- 6/10 (0=no anxiety; 5= moderate/tolerable anxiety; 10= panic attacks)   . Duration: Patient reports he has suffered from depression for the past 7 years.  . Timing: Mood is worse in the evening. Waxing and waning throughout the years and in relationship with wife and her stressors.   . Context: in - laws and his disability   . Modifying factors: Mood improves with spending time with family.     Review of Systems  Constitutional: Negative.   Cardiovascular: Negative for chest pain.  Skin: Negative for rash.  Neurological: Negative for tingling and tremors.  Psychiatric/Behavioral: Negative for suicidal ideas. The patient is nervous/anxious.       Filed Vitals:   10/15/15 1037  BP: 133/80  Pulse: 74  Weight: 176 lb (79.833 kg)   Physical Exam  Vitals reviewed.  Constitutional: He appears well-developed and well-nourished. No distress.  Skin: He is not  diaphoretic.  Musculoskeletal: Strength & Muscle Tone: within normal limits Gait & Station: normal Patient leans: N/A     Past Medical History: Reviewed  Past Medical History  Diagnosis Date  . Depression   . Renal disorder   . Renal calculi   . Ligament tear of upper extremity April 2008    Left elbow  . Diverticulitis 2013  . CRPS (complex regional pain syndrome), upper limb   . Nephrolithiasis   . Allergy   . Anxiety   . Glaucoma   . GERD (gastroesophageal reflux disease)   . Hypertension     no per pt  . Hyperlipidemia   . Intestinal metaplasia of gastric mucosa   . History of Helicobacter pylori infection   . Adenomatous colon polyp   . Diverticulosis     History of Loss of Consciousness: [name] a panic attack in 1988  Seizure History: No  Cardiac History: No   Allergies: Reviewed  Allergies  Allergen Reactions  . Adhesive [Tape]     Hives on area wear tape applied  . Hydrocodone-Acetaminophen Itching  . Hydrocodone Rash   Current Medications: Reviewed  Current Outpatient Prescriptions on File Prior to Visit  Medication Sig Dispense Refill  . atovaquone-proguanil (MALARONE) 250-100 MG TABS tablet Take 1 tablet by mouth daily. Start 2 days before travel and continue for one week after arrive back in the [place] 45 tablet 0  . gabapentin (NEURONTIN) 600 MG tablet Take 600 tablets by mouth 3 (three) times daily.    [name] LUMIGAN 0.01 % SOLN     .  nabumetone (RELAFEN) 500 MG tablet Take 1 tablet (500 mg total) by mouth 2 (two) times daily as needed for moderate pain. 60 tablet 1  . Omega-3 Fatty Acids (FISH OIL PO) Take by mouth daily.    . traMADol (ULTRAM) 50 MG tablet     . typhoid (VIVOTIF) DR capsule Take 1 capsule by mouth every other day. Start taking immediately. 4 capsule 0  . VOLTAREN 1 % GEL   5   No current facility-administered medications on file prior to visit.      Family History: Reviewed  Family History  Problem Relation Age of Onset  . Lung  cancer Father   . Kidney disease Mother   . Colon cancer Neg Hx   . Esophageal cancer Neg Hx   . Rectal cancer Neg Hx   . Stomach cancer Neg Hx     Psychiatric specialty examination:  Objective: Appearance: Casual and Well Groomed   Eye Contact:: Good   Speech: Clear and Coherent and Normal Rate   Volume: Normal   Mood: somewhat worriful   Affect: Appropriate and Congruent   Thought Process: Coherent, Logical and Loose   Orientation: Full   Thought Content: WDL   Suicidal Thoughts: No   Homicidal Thoughts: No   Judgement: Fair   Insight: Fair   Psychomotor Activity: Normal   Akathisia: No   Handed: Right   Memory: Immediate 3/3, recent 3/3   Concentration: Good   AIMS (if indicated): Not Indicated   Assets: Surveyor, mining  Housing  Social Support    Assessment:  AXIS I  Major Depressive Disorder, recurrent, moderate. GAD. Insomnia   Treatment Plan/Recommendations:  Plan of Care:  PLAN:  1. Affirm with the patient that the medications are taken as ordered. Patient  expressed understanding of how their medications were to be used.    Laboratory:  No labs warranted at this time.    Psychotherapy: Therapy: brief supportive therapy provided.  Discussed psychosocial stressors in detail and spent more then 50% of time  Medications:  Continue  the following psychiatric medications as written prior to this appointment with the following changes: Continue Zoloft 100 mg for anxiety Continue buspirone for anxiety but take regularly as he is cutting down on gabapentin  trazadone 50mg  for sleep.  Prescriptions sent.   -Risks and benefits, side effects and alternatives discussed with patient, he was given an opportunity to ask questions about his medication, illness, and treatment. All current psychiatric medications have been reviewed and discussed with the patient and adjusted as clinically appropriate. The patient has been provided an  accurate and updated list of the medications being now prescribed.  Also to consider therapy if needed for anxiety.  Routine PRN Medications:  Negative  Consultations: The patient was encouraged to keep all PCP and specialty clinic appointments.   Safety Concerns:   Patient told to call clinic if any problems occur. Patient advised to go to  ER  if he should develop SI/HI, side effects, or if symptoms worsen. Has crisis numbers to call if needed.    Other:   8. Patient was instructed to return to clinic in 2 to 3 months months.  9. The patient was advised to call and cancel their mental health appointment within 24 hours of the appointment, if they are unable to keep the appointment, as well as the three no show and termination from clinic policy. 10. The patient expressed understanding of the plan and agrees with the  above.  Time spent: 25 minutes Joell Usman, MD  10/15/2015 11:46 AM

## 2015-10-18 DIAGNOSIS — H401133 Primary open-angle glaucoma, bilateral, severe stage: Secondary | ICD-10-CM | POA: Diagnosis not present

## 2015-10-18 DIAGNOSIS — H2513 Age-related nuclear cataract, bilateral: Secondary | ICD-10-CM | POA: Diagnosis not present

## 2015-12-21 ENCOUNTER — Encounter: Payer: Self-pay | Admitting: Family Medicine

## 2016-01-13 ENCOUNTER — Encounter (HOSPITAL_COMMUNITY): Payer: Self-pay | Admitting: Psychiatry

## 2016-01-13 ENCOUNTER — Ambulatory Visit (INDEPENDENT_AMBULATORY_CARE_PROVIDER_SITE_OTHER): Payer: Medicare Other | Admitting: Psychiatry

## 2016-01-13 VITALS — BP 116/70 | HR 91 | Ht 66.0 in | Wt 184.0 lb

## 2016-01-13 DIAGNOSIS — F331 Major depressive disorder, recurrent, moderate: Secondary | ICD-10-CM | POA: Diagnosis not present

## 2016-01-13 DIAGNOSIS — F411 Generalized anxiety disorder: Secondary | ICD-10-CM | POA: Diagnosis not present

## 2016-01-13 DIAGNOSIS — F5102 Adjustment insomnia: Secondary | ICD-10-CM | POA: Diagnosis not present

## 2016-01-13 MED ORDER — BUSPIRONE HCL 10 MG PO TABS: 10.0000 mg | ORAL_TABLET | Freq: Two times a day (BID) | ORAL | Status: DC

## 2016-01-13 MED ORDER — SERTRALINE HCL 100 MG PO TABS: ORAL_TABLET | ORAL | Status: DC

## 2016-01-13 MED ORDER — TRAZODONE HCL 50 MG PO TABS: 50.0000 mg | ORAL_TABLET | Freq: Every day | ORAL | Status: DC

## 2016-01-13 NOTE — Progress Notes (Signed)
Patient ID: Nathan [name], male   DOB: August 09, 1951, 65 y.o.   MRN: [id number]   [facility] Follow-up Outpatient Visit  Nathan [name] May 08, 1951  Date: 04//2017  History of Chief Complaint:   HPI Comments: [name] is a 65 y/o male with a past psychiatric history significant for Major Depressive Disorder. GAD, insomnia.  The patient returns for psychiatric services for medication management.   Situation homicidal and better bipolar wife is still taking a lot of responsibilities for her other daughters. Patient talked about weight gain and how to work on weight loss depression wise that on he wants to stop or cut down the Zoloft as of now I told him probably is not the right time    Endorses worries, excessive at times.  . Severity: Depression: 710 (0=Very depressed; 5=Neutral; 10=Very Happy) . Mood has improved considering case has been settled.  Anxiety- 6/10 (0=no anxiety; 5= moderate/tolerable anxiety; 10= panic attacks)   . Duration: Patient reports he has suffered from depression for the past 7 years.  . Timing: Mood is worse in the evening. Waxing and waning throughout the years and in relationship with wife and her stressors.   . Context: in - laws and his disability   . Modifying factors: Mood improves with spending time with family.     Review of Systems  Cardiovascular: Negative for chest pain and palpitations.  Skin: Negative for rash.  Neurological: Negative for tingling and tremors.  Psychiatric/Behavioral: Negative for suicidal ideas.      Filed Vitals:   01/13/16 1104  BP: 116/70  Pulse: 91  Height: 5\' 6"  (1.676 m)  Weight: 184 lb (83.462 kg)  SpO2: 94%   Physical Exam  Vitals reviewed.  Constitutional: He appears well-developed and well-nourished. No distress.  Skin: He is not diaphoretic.  Musculoskeletal: Strength & Muscle Tone: within normal limits Gait & Station: normal Patient leans: N/A     Past Medical History:  Reviewed  Past Medical History  Diagnosis Date  . Depression   . Renal disorder   . Renal calculi   . Ligament tear of upper extremity April 2008    Left elbow  . Diverticulitis 2013  . CRPS (complex regional pain syndrome), upper limb   . Nephrolithiasis   . Allergy   . Anxiety   . Glaucoma   . GERD (gastroesophageal reflux disease)   . Hypertension     no per pt  . Hyperlipidemia   . Intestinal metaplasia of gastric mucosa   . History of Helicobacter pylori infection   . Adenomatous colon polyp   . Diverticulosis     History of Loss of Consciousness: Nathan [name] a panic attack in 1988  Seizure History: No  Cardiac History: No   Allergies: Reviewed  Allergies  Allergen Reactions  . Adhesive [Tape]     Hives on area wear tape applied  . Hydrocodone-Acetaminophen Itching  . Hydrocodone Rash   Current Medications: Reviewed  Current Outpatient Prescriptions on File Prior to Visit  Medication Sig Dispense Refill  . atovaquone-proguanil (MALARONE) 250-100 MG TABS tablet Take 1 tablet by mouth daily. Start 2 days before travel and continue for one week after arrive back in the [place] 45 tablet 0  . gabapentin (NEURONTIN) 600 MG tablet Take 600 tablets by mouth 3 (three) times daily.    [name] LUMIGAN 0.01 % SOLN     . nabumetone (RELAFEN) 500 MG tablet Take 1 tablet (500 mg total) by mouth 2 (two) times daily as needed  for moderate pain. 60 tablet 1  . Omega-3 Fatty Acids (FISH OIL PO) Take by mouth daily.    . traMADol (ULTRAM) 50 MG tablet     . typhoid (VIVOTIF) DR capsule Take 1 capsule by mouth every other day. Start taking immediately. 4 capsule 0  . VOLTAREN 1 % GEL   5   No current facility-administered medications on file prior to visit.      Family History: Reviewed  Family History  Problem Relation Age of Onset  . Lung cancer Father   . Kidney disease Mother   . Colon cancer Neg Hx   . Esophageal cancer Neg Hx   . Rectal cancer Neg Hx   . Stomach cancer Neg Hx      Psychiatric specialty examination:  Objective: Appearance: Casual and Well Groomed   Eye Contact:: Good   Speech: Clear and Coherent and Normal Rate   Volume: Normal   Mood: less worriful  Affect: Appropriate and Congruent   Thought Process: Coherent, Logical and Loose   Orientation: Full   Thought Content: WDL   Suicidal Thoughts: No   Homicidal Thoughts: No   Judgement: Fair   Insight: Fair   Psychomotor Activity: Normal   Akathisia: No   Handed: Right   Memory: Immediate 3/3, recent 3/3   Concentration: Good   AIMS (if indicated): Not Indicated   Assets: Surveyor, mining  Housing  Social Support    Assessment:  AXIS I  Major Depressive Disorder, recurrent, moderate. GAD. Insomnia   Treatment Plan/Recommendations:  Plan of Care:  PLAN:  1. Affirm with the patient that the medications are taken as ordered. Patient  expressed understanding of how their medications were to be used.    Laboratory:  No labs warranted at this time.    Psychotherapy: Therapy: brief supportive therapy provided.  Discussed psychosocial stressors in detail and spent more then 50% of time  Medications:  Continue  the following psychiatric medications as written prior to this appointment with the following changes: Continue Zoloft 100 mg for anxiety He takes buspar and trazadone prn only. Prescriptions sent.   May cut down dose next visit or next year on zoloft.   Routine PRN Medications:  Negative  Consultations: The patient was encouraged to keep all PCP and specialty clinic appointments.   Safety Concerns:   Patient told to call clinic if any problems occur. Patient advised to go to  ER  if he should develop SI/HI, side effects, or if symptoms worsen. Has crisis numbers to call if needed.    Other:   Follow up in 3 months.   Time spent: 25 minutes Nathan Klausing, MD  01/13/2016 11:17 AM

## 2016-01-17 ENCOUNTER — Other Ambulatory Visit: Payer: Self-pay | Admitting: Family Medicine

## 2016-01-17 ENCOUNTER — Encounter: Payer: Self-pay | Admitting: Family Medicine

## 2016-01-17 DIAGNOSIS — N5089 Other specified disorders of the male genital organs: Secondary | ICD-10-CM

## 2016-01-27 ENCOUNTER — Encounter: Payer: Self-pay | Admitting: Family Medicine

## 2016-02-11 DIAGNOSIS — H401133 Primary open-angle glaucoma, bilateral, severe stage: Secondary | ICD-10-CM | POA: Diagnosis not present

## 2016-02-24 ENCOUNTER — Encounter: Payer: Self-pay | Admitting: *Deleted

## 2016-02-25 ENCOUNTER — Encounter: Payer: Self-pay | Admitting: Internal Medicine

## 2016-02-25 ENCOUNTER — Ambulatory Visit (INDEPENDENT_AMBULATORY_CARE_PROVIDER_SITE_OTHER): Payer: Medicare Other | Admitting: Internal Medicine

## 2016-02-25 VITALS — BP 104/60 | HR 64 | Ht 66.0 in | Wt 179.0 lb

## 2016-02-25 DIAGNOSIS — R110 Nausea: Secondary | ICD-10-CM | POA: Diagnosis not present

## 2016-02-25 DIAGNOSIS — R195 Other fecal abnormalities: Secondary | ICD-10-CM

## 2016-02-25 DIAGNOSIS — R109 Unspecified abdominal pain: Secondary | ICD-10-CM | POA: Diagnosis not present

## 2016-02-25 DIAGNOSIS — R140 Abdominal distension (gaseous): Secondary | ICD-10-CM | POA: Diagnosis not present

## 2016-02-25 DIAGNOSIS — R1912 Hyperactive bowel sounds: Secondary | ICD-10-CM | POA: Diagnosis not present

## 2016-02-25 DIAGNOSIS — R198 Other specified symptoms and signs involving the digestive system and abdomen: Secondary | ICD-10-CM

## 2016-02-25 MED ORDER — HYOSCYAMINE SULFATE 0.125 MG SL SUBL: SUBLINGUAL_TABLET | SUBLINGUAL | Status: DC

## 2016-02-25 NOTE — Patient Instructions (Addendum)
We have sent the following medications to your pharmacy for you to pick up at your convenience: Levsin 1 tablet every 4-6 hours as needed for left sided abdominal pain  Please follow a FODMAP diet. We have given you a copy of this.  Please purchase the following medications over the counter and take as directed: Famotidine 20 mg twice daily as needed (do NOT take 5 days prior to breath test)  You have been scheduled for an H. Pylori/Urea Breath Test. Your appointment is on 03/10/16 at 7:30 am. Please go to [facility] for check in. Please plan on being there for around 20-30 minutes.  Please follow the instructions below to prepare for your test:  1. 03/10/15 (one day prior to your test) avoid high gas foods, high fiber goods, fruits and fruit juices, vegetables, beans, cereals, fiber supplements, onion and bell peppers. You may eat hamburger, chicken, beef, tuna fish and eggs.  2. You should have nothing to eat or drink after 9:00 pm on 03/10/15.   3. Do not smoke, chew gum or eat hard candy the morning of your test.  4. You may not have the test within 2 weeks after taking any antibiotics or within 4 weeks if confirming eradication of H.Pylori.  5. On 02/25/16 (2 weeks prior to your test), stop any Pepto-Bismol and/or proton pump inhibitors (Prevacid, Zegerid, Nexium, Prilosec, Protonix, Aciphex, Dexilant).  6. You may use over the counter strength Axid, Pepcid, Tagamet and Zantac 2 weeks prior to your tests. However, you should STOP these 24 hours before the test. You may use Tums and Maalox.   ___________________________________________________________________________  If you are age 33 or older, your body mass index should be between 23-30. Your Body mass index is 28.91 kg/(m^2). If this is out of the aforementioned range listed, please consider follow up with your Primary Care Provider.  If you are age 84 or younger, your body mass index should be between 19-25. Your Body mass  index is 28.91 kg/(m^2). If this is out of the aformentioned range listed, please consider follow up with your Primary Care Provider.

## 2016-02-25 NOTE — Progress Notes (Signed)
Subjective:    Patient ID: Nathan [name], male    DOB: 1951-05-23, 65 y.o.   MRN: [id number]  HPI Nathan [name] is a 65 year old male with a history of diverticulitis, adenomatous colon polyp, history of H. pylori status post treatment, and GERD who is here for follow-up. He was last seen one year ago. One year ago he was having left-sided abdominal pain and was treated with metronidazole for possible low-grade diverticulitis versus arterial overgrowth. He reports that this resulted in improvement in his left-sided abdominal pain for several months. Recently he's had again left upper to mid abdominal discomfort. This does not have a definite trigger and does not seem to be better or worse with eating or bowel movement. It is associated with nausea but no vomiting. Most recently associated with loose stools. Denies fever or chills. Has noticed some borborygmi and spasm type discomfort. He fears recurrent H. pylori. He is not currently taking antacid therapy. He denies true heartburn. No dysphagia or odynophagia. No blood in his stool or melena. He does occasionally use famotidine over-the-counter if he develops heartburn or dyspeptic type symptoms.  Colonoscopy and EGD were last performed 02/01/2015 -- colonoscopy revealed a single sessile cecal tubular adenoma 6 mm which was removed and moderate diverticulosis in the ascending and left colon. Five-year surveillance is recommended. Upper endoscopy showed mild antral gastropathy and multiple biopsies were obtained given history of gastric metaplasia. There was duodenal inflammation seen in the bulb. Gastric biopsy showed mild chronic gastritis without H. pylori. There was no further intestinal metaplasia. Biopsies were taking extensively from the proximal and distal stomach and placed in separate jars.   Review of Systems As per history of present illness, otherwise negative  Current Medications, Allergies, Past Medical History, Past Surgical  History, Family History and Social History were reviewed in [employer] record.     Objective:   Physical Exam BP 104/60 mmHg  Pulse 64  Ht 5\' 6"  (1.676 m)  Wt 179 lb (81.194 kg)  BMI 28.91 kg/m2 Constitutional: Well-developed and well-nourished. No distress. HEENT: Normocephalic and atraumatic. Oropharynx is clear and moist. No oropharyngeal exudate. Conjunctivae are normal.  No scleral icterus. Neck: Neck supple. Trachea midline. Cardiovascular: Normal rate, regular rhythm and intact distal pulses. No M/R/G Pulmonary/chest: Effort normal and breath sounds normal. No wheezing, rales or rhonchi. Abdominal: Soft, nontender, nondistended. Bowel sounds active throughout. There are no masses palpable. No hepatosplenomegaly. Extremities: no clubbing, cyanosis, or edema Lymphadenopathy: No cervical adenopathy noted. Neurological: Alert and oriented to person place and time. Skin: Skin is warm and dry. No rashes noted. Psychiatric: Normal mood and affect. Behavior is normal.     Assessment & Plan:  65 year old male with a history of diverticulitis, adenomatous colon polyp, history of H. pylori status post treatment, and GERD who is here for follow-up.   1. Recurrent left-sided abd pain/mild nausea/Bloating/borborygmi/intermittent loose stools -- he is concerned about recurrent H. pylori. He will be sent for H. pylori breath test. If positive he will be treated, if negative I recommend treating with rifaximin 550 mg 3 times a day 14 days for irritable bowel with loose stools. This would also treat bacterial overgrowth. He had a favorable response to antibiotics previously. Levsin 0.125 mg 1-2 tablets every 4-6 hours as needed for left-sided abdominal discomfort. We also reviewed the FODMAP diet which would likely help with his bloating and borborygmi. He can use when necessary famotidine for dyspeptic type symptoms per box instruction. If symptoms fail to  improve and no  diagnosis obtained through the above-mentioned testing, I would recommend repeat CT scan of the abdomen and pelvis.  2. History of gastric metaplasia -- resolved as of last endoscopy. We are checking for H. pylori as discussed in #1.  25 minutes spent with the patient today. Greater than 50% was spent in counseling and coordination of care with the patient

## 2016-03-10 ENCOUNTER — Ambulatory Visit (HOSPITAL_COMMUNITY): Admission: RE | Admit: 2016-03-10 | Payer: Medicare Other | Source: Ambulatory Visit | Admitting: Internal Medicine

## 2016-03-10 SURGERY — BREATH TEST, FOR HELICOBACTER PYLORI

## 2016-04-03 ENCOUNTER — Ambulatory Visit (HOSPITAL_COMMUNITY): Payer: Self-pay | Admitting: Psychiatry

## 2016-04-13 ENCOUNTER — Ambulatory Visit (INDEPENDENT_AMBULATORY_CARE_PROVIDER_SITE_OTHER): Payer: Medicare Other | Admitting: Psychiatry

## 2016-04-13 ENCOUNTER — Encounter (HOSPITAL_COMMUNITY): Payer: Self-pay | Admitting: Psychiatry

## 2016-04-13 VITALS — BP 118/67 | HR 70 | Ht 66.0 in | Wt 174.0 lb

## 2016-04-13 DIAGNOSIS — F5102 Adjustment insomnia: Secondary | ICD-10-CM

## 2016-04-13 DIAGNOSIS — F411 Generalized anxiety disorder: Secondary | ICD-10-CM

## 2016-04-13 DIAGNOSIS — F331 Major depressive disorder, recurrent, moderate: Secondary | ICD-10-CM | POA: Diagnosis not present

## 2016-04-13 MED ORDER — TRAZODONE HCL 50 MG PO TABS: 50.0000 mg | ORAL_TABLET | Freq: Every day | ORAL | Status: DC

## 2016-04-13 MED ORDER — SERTRALINE HCL 50 MG PO TABS: ORAL_TABLET | ORAL | Status: DC

## 2016-04-13 MED ORDER — BUSPIRONE HCL 10 MG PO TABS: 10.0000 mg | ORAL_TABLET | Freq: Every day | ORAL | Status: DC | PRN

## 2016-04-13 NOTE — Progress Notes (Signed)
Patient ID: Nathan [name], male   DOB: May 21, 1951, 65 y.o.   MRN: [id number]   [place] Follow-up Outpatient Visit  Nathan [name] [birth year]  Date: 04/13/2016  History of Chief Complaint:   HPI Comments: Nathan [name] is a 65 y/o male with a past psychiatric history significant for Major Depressive Disorder. GAD, insomnia.  The patient returns for psychiatric services for medication management.    We have cut down the dose to 75 mg of Zoloft he has been doing reasonable some stress related. The wife who gets too much involved with her kids. Overall some sleep issues but works on sleep hygiene takes trazodone at night no side effects reported He was to cut down further on Zoloft    Endorses worries at times.  . Severity: Depression: 710 (0=Very depressed; 5=Neutral; 10=Very Happy) . Mood has improved considering case has been settled.  Anxiety- 6/10 (0=no anxiety; 5= moderate/tolerable anxiety; 10= panic attacks)   . Duration: fluctuating depression more then 10 years.  . Timing: Waxing and waning throughout the years and in relationship with wife and her stressors.   . Context: in - laws and his disability   . Modifying factors: Mood improves with spending time with family.     Review of Systems  Cardiovascular: Negative for chest pain and palpitations.  Skin: Negative for rash.  Neurological: Negative for tingling and tremors.  Psychiatric/Behavioral: Negative for depression and suicidal ideas.      Filed Vitals:   04/13/16 1408  BP: 118/67  Pulse: 70  Height: 5\' 6"  (1.676 m)  Weight: 174 lb (78.926 kg)   Physical Exam  Vitals reviewed.  Constitutional: He appears well-developed and well-nourished. No distress.  Skin: He is not diaphoretic.  Musculoskeletal: Strength & Muscle Tone: within normal limits Gait & Station: normal Patient leans: N/A     Past Medical History: Reviewed  Past Medical History  Diagnosis Date  . Depression   .  Renal disorder   . Renal calculi   . Ligament tear of upper extremity April 2008    Left elbow  . Diverticulitis 2013  . CRPS (complex regional pain syndrome), upper limb   . Nephrolithiasis   . Allergy   . Anxiety   . Glaucoma   . GERD (gastroesophageal reflux disease)   . Hypertension     no per pt  . Hyperlipidemia   . Intestinal metaplasia of gastric mucosa   . History of Helicobacter pylori infection   . Adenomatous colon polyp   . Diverticulosis   . Diverticulosis     History of Loss of Consciousness: Nathan [name] a panic attack in 1988  Seizure History: No  Cardiac History: No   Allergies: Reviewed  Allergies  Allergen Reactions  . Adhesive [Tape]     Hives on area wear tape applied  . Hydrocodone-Acetaminophen Itching  . Hydrocodone Rash   Current Medications: Reviewed  Current Outpatient Prescriptions on File Prior to Visit  Medication Sig Dispense Refill  . atovaquone-proguanil (MALARONE) 250-100 MG TABS tablet Take 1 tablet by mouth daily. Start 2 days before travel and continue for one week after arrive back in the [place] 45 tablet 0  . busPIRone (BUSPAR) 10 MG tablet Take 1 tablet (10 mg total) by mouth 2 (two) times daily. 60 tablet 0  . gabapentin (NEURONTIN) 600 MG tablet Take 600 tablets by mouth 3 (three) times daily.    . hyoscyamine (LEVSIN/SL) 0.125 MG SL tablet Take 1 tablet by mouth every 4-6 hours as  needed 40 tablet 2  . LUMIGAN 0.01 % SOLN     . Omega-3 Fatty Acids (FISH OIL PO) Take by mouth daily.    . sertraline (ZOLOFT) 100 MG tablet TAKE 1 TABLET (100 MG TOTAL) BY MOUTH DAILY. 30 tablet 2  . traZODone (DESYREL) 50 MG tablet Take 1 tablet (50 mg total) by mouth at bedtime. 30 tablet 0  . VOLTAREN 1 % GEL   5   No current facility-administered medications on file prior to visit.      Family History: Reviewed  Family History  Problem Relation Age of Onset  . Lung cancer Father   . Kidney disease Mother   . Colon cancer Neg Hx   . Esophageal  cancer Neg Hx   . Rectal cancer Neg Hx   . Stomach cancer Neg Hx     Psychiatric specialty examination:  Objective: Appearance: Casual and Well Groomed   Eye Contact:: Good   Speech: Clear and Coherent and Normal Rate   Volume: Normal   Mood: less worriful  Affect: Appropriate and Congruent   Thought Process: Coherent, Logical and Loose   Orientation: Full   Thought Content: WDL   Suicidal Thoughts: No   Homicidal Thoughts: No   Judgement: Fair   Insight: Fair   Psychomotor Activity: Normal   Akathisia: No   Handed: Right   Memory: Immediate 3/3, recent 3/3   Concentration: Good   AIMS (if indicated): Not Indicated   Assets: Surveyor, mining  Housing  Social Support    Assessment:  AXIS I  Major Depressive Disorder, recurrent, moderate. GAD. Insomnia   Treatment Plan/Recommendations:  Plan of Care:  PLAN:  1. Affirm with the patient that the medications are taken as ordered. Patient  expressed understanding of how their medications were to be used.    Laboratory:  No labs warranted at this time.    Psychotherapy: Therapy: brief supportive therapy provided.  Discussed psychosocial stressors in detail and spent more then 50% of time  Medications:  Continue  the following psychiatric medications as written prior to this appointment with the following changes: Continue Zoloft and lower it to 50mg  qd.  He takes buspar and trazadone prn only. Prescriptions sent.  he will call back in one month state how is he doing since he still wants to come back in 3 months.    Routine PRN Medications:  Negative  Consultations: The patient was encouraged to keep all PCP and specialty clinic appointments.   Safety Concerns:   Patient told to call clinic if any problems occur. Patient advised to go to  ER  if he should develop SI/HI, side effects, or if symptoms worsen. Has crisis numbers to call if needed.    Other:   Follow up in 3 months.   Time  spent: 25 minutes Bently Morath, MD  04/13/2016 2:10 PM

## 2016-04-18 DIAGNOSIS — N503 Cyst of epididymis: Secondary | ICD-10-CM | POA: Diagnosis not present

## 2016-04-18 DIAGNOSIS — N400 Enlarged prostate without lower urinary tract symptoms: Secondary | ICD-10-CM | POA: Diagnosis not present

## 2016-05-09 DIAGNOSIS — M25561 Pain in right knee: Secondary | ICD-10-CM | POA: Diagnosis not present

## 2016-05-09 DIAGNOSIS — M25562 Pain in left knee: Secondary | ICD-10-CM | POA: Diagnosis not present

## 2016-06-08 ENCOUNTER — Encounter: Payer: Self-pay | Admitting: Family Medicine

## 2016-06-08 ENCOUNTER — Ambulatory Visit (INDEPENDENT_AMBULATORY_CARE_PROVIDER_SITE_OTHER): Payer: Medicare Other | Admitting: Family Medicine

## 2016-06-08 VITALS — BP 127/72 | HR 88 | Wt 178.0 lb

## 2016-06-08 DIAGNOSIS — R7301 Impaired fasting glucose: Secondary | ICD-10-CM | POA: Diagnosis not present

## 2016-06-08 DIAGNOSIS — Z0000 Encounter for general adult medical examination without abnormal findings: Secondary | ICD-10-CM | POA: Diagnosis not present

## 2016-06-08 DIAGNOSIS — I10 Essential (primary) hypertension: Secondary | ICD-10-CM | POA: Diagnosis not present

## 2016-06-08 DIAGNOSIS — Z125 Encounter for screening for malignant neoplasm of prostate: Secondary | ICD-10-CM

## 2016-06-08 LAB — POCT GLYCOSYLATED HEMOGLOBIN (HGB A1C): Hemoglobin A1C: 6

## 2016-06-08 NOTE — Patient Instructions (Signed)
Keep up a regular exercise program and make sure you are eating a healthy diet Try to eat 4 servings of dairy a day, or if you are lactose intolerant take a calcium with vitamin D daily.  Your vaccines are up to date.   

## 2016-06-08 NOTE — Progress Notes (Signed)
Subjective:   Nathan [name] is a 65 y.o. male who presents for Medicare Annual/Subsequent preventive examination.  Review of Systems:  Comprehensive ROS is negatife.        Objective:    Vitals: BP 127/72   Pulse 88   Wt 178 lb (80.7 kg)   SpO2 96%   BMI 28.73 kg/m   Body mass index is 28.73 kg/m.  Tobacco History  Smoking Status  . Former Smoker  . Packs/day: 1.00  . Years: 10.00  . Types: Cigarettes  . Quit date: 09/25/1981  Smokeless Tobacco  . Never Used     Counseling given: Not Answered   Past Medical History:  Diagnosis Date  . Adenomatous colon polyp   . Allergy   . Anxiety   . CRPS (complex regional pain syndrome), upper limb   . Depression   . Diverticulitis 2013  . Diverticulosis   . Diverticulosis   . GERD (gastroesophageal reflux disease)   . Glaucoma   . History of Helicobacter pylori infection   . Hyperlipidemia   . Hypertension    no per pt  . Intestinal metaplasia of gastric mucosa   . Ligament tear of upper extremity April 2008   Left elbow  . Nephrolithiasis   . Renal calculi   . Renal disorder    Past Surgical History:  Procedure Laterality Date  . LITHOTRIPSY  2004   kidney stones  . REPLACEMENT TOTAL KNEE Right 2014  . ROTATOR CUFF REPAIR Left 2011  . ULNAR NERVE REPAIR Left 2009, 2010,   3 surgeries   Family History  Problem Relation Age of Onset  . Lung cancer Father   . Kidney disease Mother   . Colon cancer Neg Hx   . Esophageal cancer Neg Hx   . Rectal cancer Neg Hx   . Stomach cancer Neg Hx    History  Sexual Activity  . Sexual activity: Yes  . Partners: Female    Outpatient Encounter Prescriptions as of 06/08/2016  Medication Sig  . busPIRone (BUSPAR) 10 MG tablet Take 1 tablet (10 mg total) by mouth daily as needed.  . gabapentin (NEURONTIN) 600 MG tablet Take 600 tablets by mouth 3 (three) times daily.  [name] LUMIGAN 0.01 % SOLN   . Omega-3 Fatty Acids (FISH OIL PO) Take by mouth daily.  .  sertraline (ZOLOFT) 50 MG tablet TAKE 1 TABLET (100 MG TOTAL) BY MOUTH DAILY.  . traZODone (DESYREL) 50 MG tablet Take 1 tablet (50 mg total) by mouth at bedtime.  . VOLTAREN 1 % GEL   . [DISCONTINUED] atovaquone-proguanil (MALARONE) 250-100 MG TABS tablet Take 1 tablet by mouth daily. Start 2 days before travel and continue for one week after arrive back in the [place]  . [DISCONTINUED] hyoscyamine (LEVSIN/SL) 0.125 MG SL tablet Take 1 tablet by mouth every 4-6 hours as needed   No facility-administered encounter medications on file as of 06/08/2016.     Activities of Daily Living In your present state of health, do you have any difficulty performing the following activities: 06/08/2016  Hearing? N  Vision? N  Difficulty concentrating or making decisions? N  Walking or climbing stairs? N  Dressing or bathing? N  Doing errands, shopping? N  Some recent data might be hidden    Patient Care Team: Hali Marry, MD as PCP - General   Assessment:    Medicare Wellness Exam  Exercise Activities and Dietary recommendations Current Exercise Habits: Home exercise routine, Type  of exercise: walking, Intensity: Mild  Goals    None     Fall Risk Fall Risk  06/08/2016 05/27/2014  Falls in the past year? No No   Depression Screen PHQ 2/9 Scores 06/08/2016 06/08/2016 05/27/2014  PHQ - 2 Score 0 0 1  Some encounter information is confidential and restricted. Go to Review Flowsheets activity to see all data.    Cognitive Testing No flowsheet data found.  6 CIT score of 0.  Normal.    Immunization History  Administered Date(s) Administered  . Hep A / Hep B 07/30/2015, 08/09/2015, 08/30/2015  . Influenza Whole 06/25/2009  . Tdap 06/17/2007   Screening Tests Health Maintenance  Topic Date Due  . ZOSTAVAX  07/02/2011  . INFLUENZA VACCINE  09/24/2016 (Originally 04/25/2016)  . TETANUS/TDAP  06/16/2017  . COLONOSCOPY  02/01/2020  . Hepatitis C Screening  Completed  . HIV Screening   Completed      Plan:    During the course of the visit the patient was educated and counseled about the following appropriate screening and preventive services:   Vaccines to include Pneumoccal, Influenza, Hepatitis B, Td, Zostavax, HCV   Cardiovascular Disease  Colorectal cancer screening  Diabetes screening  Prostate Cancer Screening  Glaucoma screening  Nutrition counseling   IFG -  A1C stable today.   Lab Results  Component Value Date   HGBA1C 6.0 06/08/2016    Patient Instructions (the written plan) was given to the patient.    METHENEY,CATHERINE, MD  06/08/2016

## 2016-06-09 DIAGNOSIS — N503 Cyst of epididymis: Secondary | ICD-10-CM | POA: Diagnosis not present

## 2016-06-09 DIAGNOSIS — N400 Enlarged prostate without lower urinary tract symptoms: Secondary | ICD-10-CM | POA: Diagnosis not present

## 2016-06-14 DIAGNOSIS — R7301 Impaired fasting glucose: Secondary | ICD-10-CM | POA: Diagnosis not present

## 2016-06-14 DIAGNOSIS — Z96651 Presence of right artificial knee joint: Secondary | ICD-10-CM | POA: Diagnosis not present

## 2016-06-14 DIAGNOSIS — I10 Essential (primary) hypertension: Secondary | ICD-10-CM | POA: Diagnosis not present

## 2016-06-14 DIAGNOSIS — M25561 Pain in right knee: Secondary | ICD-10-CM | POA: Diagnosis not present

## 2016-06-14 DIAGNOSIS — Z125 Encounter for screening for malignant neoplasm of prostate: Secondary | ICD-10-CM | POA: Diagnosis not present

## 2016-06-14 DIAGNOSIS — Z471 Aftercare following joint replacement surgery: Secondary | ICD-10-CM | POA: Diagnosis not present

## 2016-06-14 LAB — COMPLETE METABOLIC PANEL WITH GFR
ALT: 17 U/L (ref 9–46)
AST: 19 U/L (ref 10–35)
Albumin: 4.1 g/dL (ref 3.6–5.1)
Alkaline Phosphatase: 105 U/L (ref 40–115)
BUN: 13 mg/dL (ref 7–25)
CO2: 24 mmol/L (ref 20–31)
Calcium: 9.1 mg/dL (ref 8.6–10.3)
Chloride: 104 mmol/L (ref 98–110)
Creat: 0.88 mg/dL (ref 0.70–1.25)
GFR, Est African American: >89 mL/min (ref >=60)
GFR, Est Non African American: >89 mL/min (ref >=60)
Glucose, Bld: 107 mg/dL — ABNORMAL HIGH (ref 65–99)
Potassium: 4.1 mmol/L (ref 3.5–5.3)
Sodium: 140 mmol/L (ref 135–146)
Total Bilirubin: 1.2 mg/dL (ref 0.2–1.2)
Total Protein: 7.2 g/dL (ref 6.1–8.1)

## 2016-06-14 LAB — LIPID PANEL
Cholesterol: 187 mg/dL (ref 125–200)
HDL: 37 mg/dL — ABNORMAL LOW (ref >=40)
LDL Cholesterol: 105 mg/dL (ref <130)
Total CHOL/HDL Ratio: 5.1 Ratio — ABNORMAL HIGH (ref <=5.0)
Triglycerides: 227 mg/dL — ABNORMAL HIGH (ref <150)
VLDL: 45 mg/dL — ABNORMAL HIGH (ref <30)

## 2016-06-14 LAB — PSA: PSA: 1.5 ng/mL (ref <=4.0)

## 2016-06-16 DIAGNOSIS — M25562 Pain in left knee: Secondary | ICD-10-CM | POA: Diagnosis not present

## 2016-06-16 DIAGNOSIS — M25561 Pain in right knee: Secondary | ICD-10-CM | POA: Diagnosis not present

## 2016-06-16 DIAGNOSIS — Z471 Aftercare following joint replacement surgery: Secondary | ICD-10-CM | POA: Diagnosis not present

## 2016-06-16 DIAGNOSIS — Z96651 Presence of right artificial knee joint: Secondary | ICD-10-CM | POA: Diagnosis not present

## 2016-06-18 IMAGING — MR MR SHOULDER*R* W/O CM
5 series · 40 of 40 positions shown · non-contrast
Comparison: Radiographs 06/01/2015

CLINICAL DATA: Fell 1 month ago and injured right shoulder.
Persistent pain and limited range of motion.

EXAM:
MRI OF THE RIGHT SHOULDER WITHOUT CONTRAST
TECHNIQUE: Multiplanar, multisequence MR imaging of the shoulder was performed.
No intravenous contrast was administered.

[Series 4: T2 fat-sat · axial · 4.0mm · 0.47mm/px · z∈[-17,+80]mm · 10 of 24 slices shown (1 of 3)]
[im 1/24]
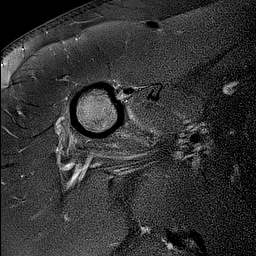
[im 3/24]
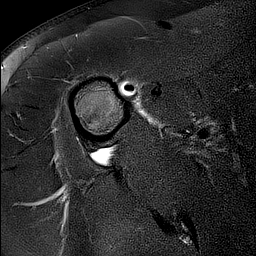
[im 6/24]
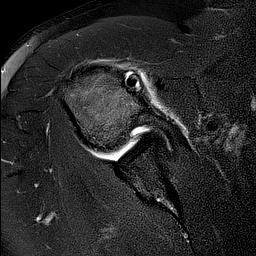
[im 8/24]
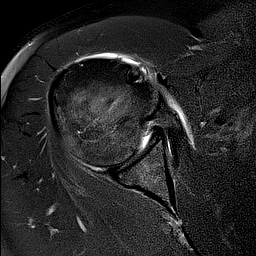
[im 11/24]
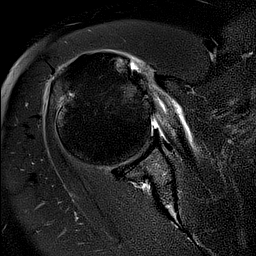
[im 13/24]
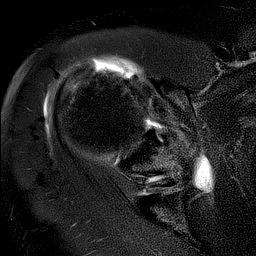
[im 16/24]
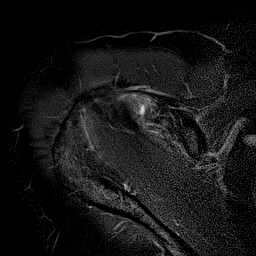
[im 18/24]
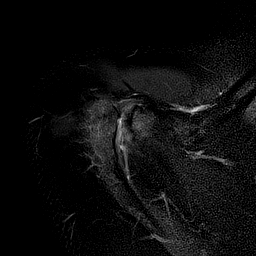
[im 21/24]
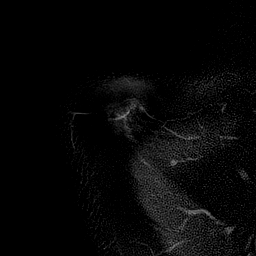
[im 24/24]
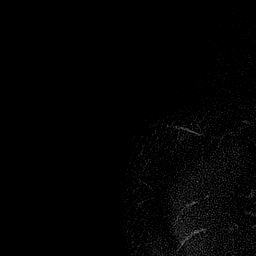

[Series 5: T2 fat-sat · oblique · 4.0mm · 0.62mm/px · 7 of 20 slices shown (2 of 3)]
[im 1/20]
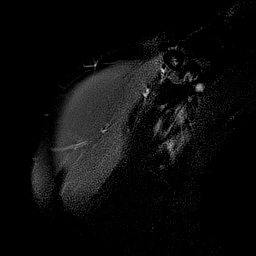
[im 4/20]
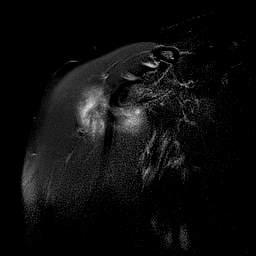
[im 7/20]
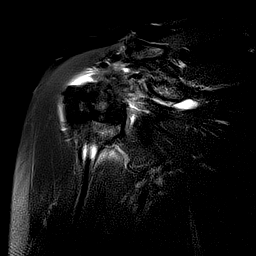
[im 10/20]
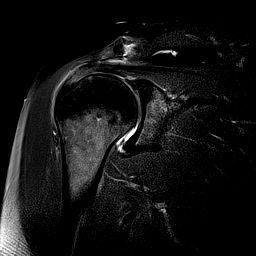
[im 13/20]
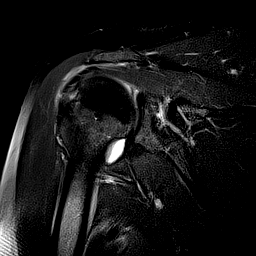
[im 16/20]
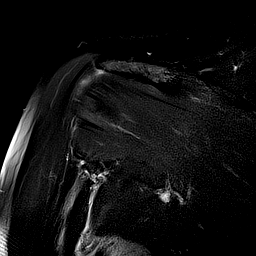
[im 20/20]
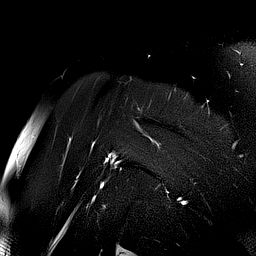

[Series 6: PD · oblique · 4.0mm · 0.62mm/px · 7 of 20 slices shown]
[im 1/20]
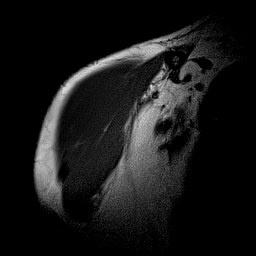
[im 4/20]
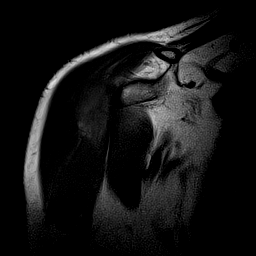
[im 7/20]
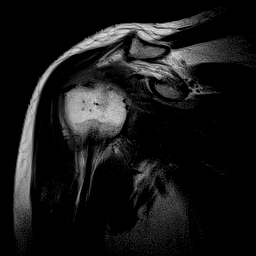
[im 10/20]
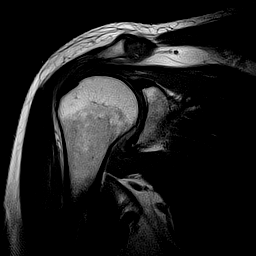
[im 13/20]
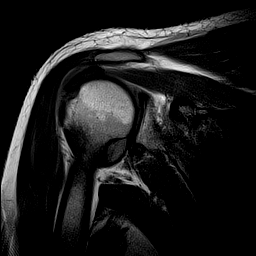
[im 16/20]
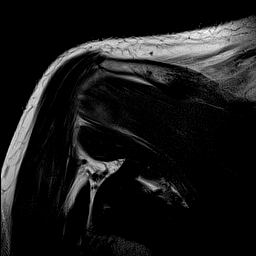
[im 20/20]
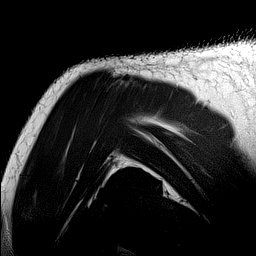

[Series 7: T1 · oblique · 4.0mm · 0.55mm/px · 8 of 23 slices shown]
[im 1/23]
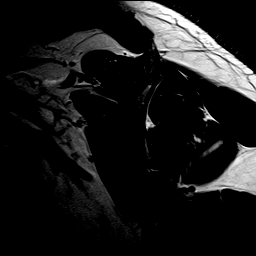
[im 4/23]
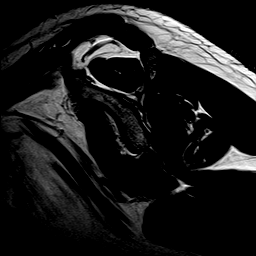
[im 7/23]
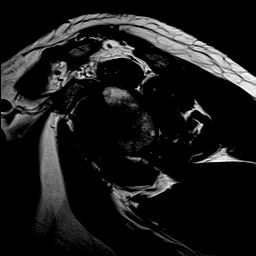
[im 10/23]
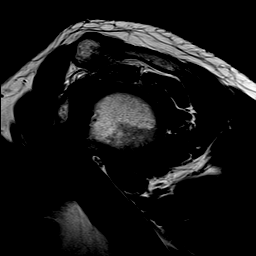
[im 13/23]
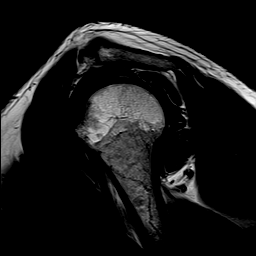
[im 16/23]
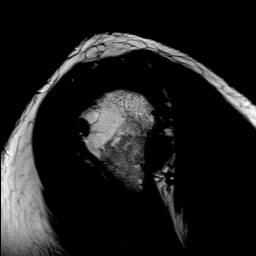
[im 19/23]
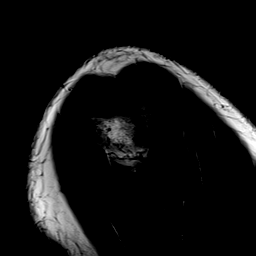
[im 23/23]
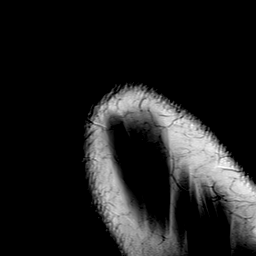

[Series 8: T2 fat-sat · oblique · 4.0mm · 0.55mm/px · 8 of 23 slices shown (3 of 3)]
[im 1/23]
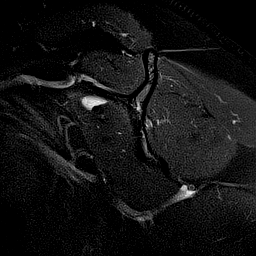
[im 4/23]
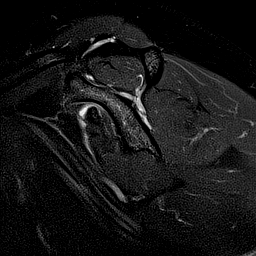
[im 7/23]
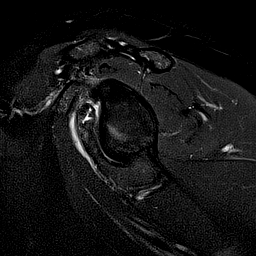
[im 10/23]
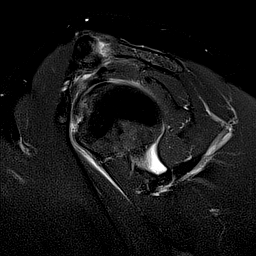
[im 13/23]
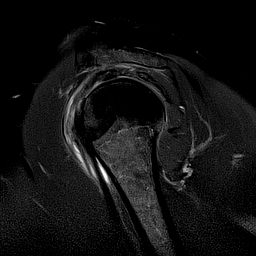
[im 16/23]
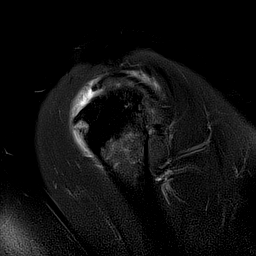
[im 19/23]
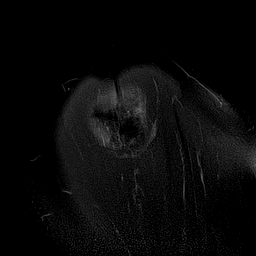
[im 23/23]
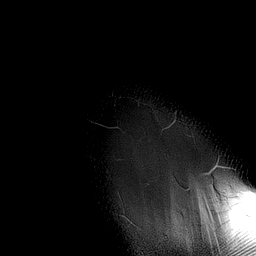

[40 of 40 positions shown; findings below may reference images not displayed]

FINDINGS: Rotator cuff: There is a full-thickness retracted supraspinatus
tendon tear anteriorly. There is 13 mm of retraction and the tear is
17 mm wide. Moderate tendinopathy involving the infraspinatus and
subscapularis tendons. Partial thickness articular surface tearing
involving the superior fibers of the subscapularis tendon.

Muscles:  Normal.

Biceps long head:  Intact.

Acromioclavicular Joint: Mild AC joint degenerative changes. The
acromion is type 1- 2 in shape. Mild lateral downsloping. No
undersurface spurring.

Glenohumeral Joint: Normal

Labrum: Degenerative fraying type changes involving the superior
labrum. The anterior and posterior labrum appear normal.

Bones:  No acute bony findings.
IMPRESSION: 1. Full-thickness retracted supraspinatus tendon tear anteriorly. 13
mm of retraction and the tear is 17 mm wide.
2. Moderate infraspinatus and subscapularis tendinopathy. Partial
thickness articular surface tearing involving the upper fibers of
the subscapularis tendon.
3. No significant findings for bony impingement.
4. Degenerative fraying type changes involving the superior labrum.
5. Intact long head biceps tendon.

## 2016-07-07 ENCOUNTER — Encounter (HOSPITAL_COMMUNITY): Payer: Self-pay | Admitting: Psychiatry

## 2016-07-07 ENCOUNTER — Ambulatory Visit (INDEPENDENT_AMBULATORY_CARE_PROVIDER_SITE_OTHER): Payer: Medicare Other | Admitting: Psychiatry

## 2016-07-07 VITALS — BP 126/70 | HR 73 | Resp 14 | Ht 66.0 in | Wt 177.0 lb

## 2016-07-07 DIAGNOSIS — Z8043 Family history of malignant neoplasm of testis: Secondary | ICD-10-CM

## 2016-07-07 DIAGNOSIS — Z801 Family history of malignant neoplasm of trachea, bronchus and lung: Secondary | ICD-10-CM

## 2016-07-07 DIAGNOSIS — F411 Generalized anxiety disorder: Secondary | ICD-10-CM

## 2016-07-07 DIAGNOSIS — F5102 Adjustment insomnia: Secondary | ICD-10-CM

## 2016-07-07 DIAGNOSIS — Z79899 Other long term (current) drug therapy: Secondary | ICD-10-CM

## 2016-07-07 DIAGNOSIS — Z800 Family history of malignant neoplasm of digestive organs: Secondary | ICD-10-CM

## 2016-07-07 DIAGNOSIS — F331 Major depressive disorder, recurrent, moderate: Secondary | ICD-10-CM

## 2016-07-07 DIAGNOSIS — Z8051 Family history of malignant neoplasm of kidney: Secondary | ICD-10-CM

## 2016-07-07 MED ORDER — BUSPIRONE HCL 10 MG PO TABS: 10.0000 mg | ORAL_TABLET | Freq: Two times a day (BID) | ORAL | 0 refills | Status: DC

## 2016-07-07 MED ORDER — TRAZODONE HCL 50 MG PO TABS: 50.0000 mg | ORAL_TABLET | Freq: Every day | ORAL | 0 refills | Status: DC

## 2016-07-07 MED ORDER — SERTRALINE HCL 50 MG PO TABS: 50.0000 mg | ORAL_TABLET | Freq: Every day | ORAL | 1 refills | Status: DC

## 2016-07-07 NOTE — Progress Notes (Signed)
Patient ID: Hulbert Huckeby, male   DOB: 05/08/1951, 65 y.o.   MRN: [id number]   [facility] Follow-up Outpatient Visit  Emanuel Stroble 1951-06-12  Date: 04/13/2016  History of Chief Complaint:   HPI Comments: Mr. Walcott is a 65 y/o male with a past psychiatric history significant for Major Depressive Disorder. GAD, insomnia.  The patient returns for psychiatric services for medication management.    Patient is tolerating Zoloft and trazodone. He has cut down the Zoloft to 50 mg tolerating and reasonable with anxious about his wife's daughter. Who is again in the hospital because of heart valve infection  It upsets her but he cannot do much because it is part of her family  Endorses worries at times.  . Severity: Depression: 710 (0=Very depressed; 5=Neutral; 10=Very Happy) . Mood has improved considering case has been settled.  Anxiety- 6/10 (0=no anxiety; 5= moderate/tolerable anxiety; 10= panic attacks)  Sleep adequate on trazadone sometimes take half tablet.   . Duration: fluctuating depression more then 10 years.  . Timing: Waxing and waning throughout the years and in relationship with wife and her stressors.   . Context: in - laws and his disability   . Modifying factors: Mood improves with spending time with family.     Review of Systems  Cardiovascular: Negative for chest pain and palpitations.  Skin: Negative for rash.  Neurological: Negative for tingling and tremors.  Psychiatric/Behavioral: Negative for depression and suicidal ideas. The patient is nervous/anxious.       Vitals:   07/07/16 1155  BP: 126/70  BP Location: Right Arm  Patient Position: Sitting  Cuff Size: Normal  Pulse: 73  Resp: 14  SpO2: 94%  Weight: 177 lb (80.3 kg)  Height: 5\' 6"  (1.676 m)   Physical Exam  Vitals reviewed.  Constitutional: He appears well-developed and well-nourished. No distress.  Skin: He is not diaphoretic.  Musculoskeletal: Strength & Muscle  Tone: within normal limits Gait & Station: normal Patient leans: N/A     Past Medical History: Reviewed  Past Medical History:  Diagnosis Date  . Adenomatous colon polyp   . Allergy   . Anxiety   . CRPS (complex regional pain syndrome), upper limb   . Depression   . Diverticulitis 2013  . Diverticulosis   . Diverticulosis   . GERD (gastroesophageal reflux disease)   . Glaucoma   . History of Helicobacter pylori infection   . Hyperlipidemia   . Hypertension    no per pt  . Intestinal metaplasia of gastric mucosa   . Ligament tear of upper extremity April 2008   Left elbow  . Nephrolithiasis   . Renal calculi   . Renal disorder     History of Loss of Consciousness: [name] a panic attack in 1988  Seizure History: No  Cardiac History: No   Allergies: Reviewed  Allergies  Allergen Reactions  . Adhesive [Tape]     Hives on area wear tape applied  . Hydrocodone-Acetaminophen Itching  . Hydrocodone Rash   Current Medications: Reviewed  Current Outpatient Prescriptions on File Prior to Visit  Medication Sig Dispense Refill  . gabapentin (NEURONTIN) 600 MG tablet Take 600 tablets by mouth 2 (two) times daily.     [name] LUMIGAN 0.01 % SOLN     . Omega-3 Fatty Acids (FISH OIL PO) Take by mouth daily.    . VOLTAREN 1 % GEL   5   No current facility-administered medications on file prior to visit.  Family History: Reviewed  Family History  Problem Relation Age of Onset  . Lung cancer Father   . Kidney disease Mother   . Colon cancer Neg Hx   . Esophageal cancer Neg Hx   . Rectal cancer Neg Hx   . Stomach cancer Neg Hx     Psychiatric specialty examination:  Objective: Appearance: Casual and Well Groomed   Eye Contact:: Good   Speech: Clear and Coherent and Normal Rate   Volume: Normal   Mood: somewhat stressed   Affect: Appropriate and Congruent   Thought Process: Coherent, Logical and Loose   Orientation: Full   Thought Content: WDL   Suicidal  Thoughts: No   Homicidal Thoughts: No   Judgement: Fair   Insight: Fair   Psychomotor Activity: Normal   Akathisia: No   Handed: Right   Memory: Immediate 3/3, recent 3/3   Concentration: Good   AIMS (if indicated): Not Indicated   Assets: Surveyor, mining  Housing  Social Support    Assessment:  AXIS I  Major Depressive Disorder, recurrent, moderate. GAD. Insomnia   Treatment Plan/Recommendations:  Plan of Care:  PLAN:  1. Affirm with the patient that the medications are taken as ordered. Patient  expressed understanding of how their medications were to be used.    Laboratory:  No labs warranted at this time.    Psychotherapy: Therapy: brief supportive therapy provided.  Discussed psychosocial stressors in detail and spent more then 50% of time  Medications:  Continue  the following psychiatric medications as written prior to this appointment with the following changes: Continue Zoloft  50mg  qd.  He takes buspar and trazadone prn only. Will increase buspar to bid if needed. 60 tablet max upto a month Prescriptions sent.    Routine PRN Medications:  Negative  Consultations: The patient was encouraged to keep all PCP and specialty clinic appointments.   Safety Concerns:   Patient told to call clinic if any problems occur. Patient advised to go to  ER  if he should develop SI/HI, side effects, or if symptoms worsen. Has crisis numbers to call if needed.    Other:   Follow up in 3 months. Says will call if need to come early.  Time spent: 25 minutes Jaquann Guarisco, MD  07/07/2016 12:12 PM

## 2016-07-11 DIAGNOSIS — M25561 Pain in right knee: Secondary | ICD-10-CM | POA: Diagnosis not present

## 2016-09-21 ENCOUNTER — Other Ambulatory Visit (HOSPITAL_COMMUNITY): Payer: Self-pay | Admitting: Psychiatry

## 2016-09-29 ENCOUNTER — Encounter (HOSPITAL_COMMUNITY): Payer: Self-pay | Admitting: Psychiatry

## 2016-09-29 ENCOUNTER — Ambulatory Visit (INDEPENDENT_AMBULATORY_CARE_PROVIDER_SITE_OTHER): Payer: Medicare Other | Admitting: Psychiatry

## 2016-09-29 DIAGNOSIS — F331 Major depressive disorder, recurrent, moderate: Secondary | ICD-10-CM

## 2016-09-29 DIAGNOSIS — F411 Generalized anxiety disorder: Secondary | ICD-10-CM | POA: Diagnosis not present

## 2016-09-29 DIAGNOSIS — Z888 Allergy status to other drugs, medicaments and biological substances status: Secondary | ICD-10-CM | POA: Diagnosis not present

## 2016-09-29 DIAGNOSIS — Z841 Family history of disorders of kidney and ureter: Secondary | ICD-10-CM

## 2016-09-29 DIAGNOSIS — Z801 Family history of malignant neoplasm of trachea, bronchus and lung: Secondary | ICD-10-CM

## 2016-09-29 DIAGNOSIS — F5102 Adjustment insomnia: Secondary | ICD-10-CM | POA: Diagnosis not present

## 2016-09-29 MED ORDER — BUSPIRONE HCL 10 MG PO TABS: 10.0000 mg | ORAL_TABLET | Freq: Two times a day (BID) | ORAL | 1 refills | Status: DC

## 2016-09-29 MED ORDER — SERTRALINE HCL 50 MG PO TABS: 50.0000 mg | ORAL_TABLET | Freq: Every day | ORAL | 1 refills | Status: DC

## 2016-09-29 MED ORDER — TRAZODONE HCL 50 MG PO TABS: 50.0000 mg | ORAL_TABLET | Freq: Every day | ORAL | 1 refills | Status: DC

## 2016-09-29 NOTE — Progress Notes (Signed)
Patient ID: Nathan [name], male   DOB: 1951-02-24, 66 y.o.   MRN: [id number]   [place] Follow-up Outpatient Visit  Nathan [name] Apr 04, 1951  Date: 09/29/2016    History of Chief Complaint:   HPI Comments: Nathan [name] is a 66 y/o male with a past psychiatric history significant for Major Depressive Disorder. GAD, insomnia.  The patient returns for psychiatric services for medication management.   Patient continues to have some depression that fluctuates regarding stress related to his wife. Apparently wife has lost job. His wife gets entangled in 2 her daughter and her drug use that affects the family  Otherwise tolerating Zoloft and medications. He also takes trazodone for sleep BuSpar for anxiety anxiety fluctuates depending on her stress level  . Severity: Depression: 7/10 (0=Very depressed; 5=Neutral; 10=Very Happy) . Mood has improved considering case has been settled.  Anxiety- 6/10 (0=no anxiety; 5= moderate/tolerable anxiety; 10= panic attacks)  Patient endorses some difficult time over the holidays because of loss of her wife's job but overall he doesn't give insulin busy he is tolerating medications  . Duration: fluctuating depression more then 10 years.    . Modifying factors: Mood improves with spending time with family.     Review of Systems  Cardiovascular: Negative for chest pain.  Skin: Negative for rash.  Neurological: Negative for tingling, tremors and headaches.  Psychiatric/Behavioral: Negative for depression and suicidal ideas.      There were no vitals filed for this visit. Physical Exam  Vitals reviewed.  Constitutional: He appears well-developed and well-nourished. No distress.  Skin: He is not diaphoretic.       Past Medical History: Reviewed  Past Medical History:  Diagnosis Date  . Adenomatous colon polyp   . Allergy   . Anxiety   . CRPS (complex regional pain syndrome), upper limb   . Depression   .  Diverticulitis 2013  . Diverticulosis   . Diverticulosis   . GERD (gastroesophageal reflux disease)   . Glaucoma   . History of Helicobacter pylori infection   . Hyperlipidemia   . Hypertension    no per pt  . Intestinal metaplasia of gastric mucosa   . Ligament tear of upper extremity April 2008   Left elbow  . Nephrolithiasis   . Renal calculi   . Renal disorder       Allergies: Reviewed  Allergies  Allergen Reactions  . Adhesive [Tape]     Hives on area wear tape applied  . Hydrocodone-Acetaminophen Itching  . Hydrocodone Rash   Current Medications: Reviewed  Current Outpatient Prescriptions on File Prior to Visit  Medication Sig Dispense Refill  . gabapentin (NEURONTIN) 600 MG tablet Take 600 tablets by mouth 2 (two) times daily.     [name] LUMIGAN 0.01 % SOLN     . Omega-3 Fatty Acids (FISH OIL PO) Take by mouth daily.    . VOLTAREN 1 % GEL   5   No current facility-administered medications on file prior to visit.       Family History: Reviewed  Family History  Problem Relation Age of Onset  . Lung cancer Father   . Kidney disease Mother   . Colon cancer Neg Hx   . Esophageal cancer Neg Hx   . Rectal cancer Neg Hx   . Stomach cancer Neg Hx     Psychiatric specialty examination:  Objective: Appearance: Casual and Well Groomed   Eye Contact:: Good   Speech: Clear and Coherent and Normal Rate  Volume: Normal   Mood: fair to stressed at times  Affect: Appropriate and Congruent   Thought Process: Coherent, Logical and Loose   Orientation: Full   Thought Content: WDL   Suicidal Thoughts: No   Homicidal Thoughts: No   Judgement: Fair   Insight: Fair   Psychomotor Activity: Normal   Akathisia: No   Handed: Right   Memory: Immediate 3/3, recent 3/3   Concentration: Good   AIMS (if indicated): Not Indicated   Assets: Surveyor, mining  Housing  Social Support    Assessment:  AXIS I  Major Depressive Disorder,  recurrent, moderate. GAD. Insomnia   Treatment Plan/Recommendations:  Plan of Care:  PLAN:  1. Affirm with the patient that the medications are taken as ordered. Patient  expressed understanding of how their medications were to be used.    Laboratory:  No labs warranted at this time.    Psychotherapy: Therapy: brief supportive therapy provided.  Discussed psychosocial stressors in detail and spent more then 50% of time  Medications:  Continue current medication dosages. No treatment change done to last visit.  Continue Zoloft  50mg  qd.  He takes buspar and trazadone prn only. buspar bid Prescriptions sent  He feels meds are doing what it can and he is adjusting to stressors.  FU 2-3 months           Time spent: 25 minutes Uchechukwu Dhawan, MD  09/29/2016 12:36 PM

## 2016-10-03 DIAGNOSIS — M19012 Primary osteoarthritis, left shoulder: Secondary | ICD-10-CM | POA: Diagnosis not present

## 2016-10-16 DIAGNOSIS — H2513 Age-related nuclear cataract, bilateral: Secondary | ICD-10-CM | POA: Diagnosis not present

## 2016-10-16 DIAGNOSIS — H43393 Other vitreous opacities, bilateral: Secondary | ICD-10-CM | POA: Diagnosis not present

## 2016-10-16 DIAGNOSIS — H401133 Primary open-angle glaucoma, bilateral, severe stage: Secondary | ICD-10-CM | POA: Diagnosis not present

## 2016-10-16 DIAGNOSIS — H35373 Puckering of macula, bilateral: Secondary | ICD-10-CM | POA: Diagnosis not present

## 2016-11-27 ENCOUNTER — Other Ambulatory Visit (HOSPITAL_COMMUNITY): Payer: Self-pay | Admitting: *Deleted

## 2016-11-27 MED ORDER — BUSPIRONE HCL 10 MG PO TABS: 10.0000 mg | ORAL_TABLET | Freq: Two times a day (BID) | ORAL | 0 refills | Status: DC

## 2016-11-27 NOTE — Telephone Encounter (Signed)
Pt requested a refill for Buspar. Medication was last filled on 09/29/16 with 1 additional refill. Per Dr. De Nurse, refill request is authorize for Buspar 10mg , #60. Prescription was sent to pharmacy.  Pt's next apt is schedule on 12/15/16. Called and informed pt of refill status. Pt verbalizes understanding.

## 2016-12-07 ENCOUNTER — Ambulatory Visit (INDEPENDENT_AMBULATORY_CARE_PROVIDER_SITE_OTHER): Payer: Medicare Other | Admitting: Family Medicine

## 2016-12-07 ENCOUNTER — Encounter: Payer: Self-pay | Admitting: Family Medicine

## 2016-12-07 VITALS — BP 124/79 | HR 84 | Ht 66.0 in | Wt 183.0 lb

## 2016-12-07 DIAGNOSIS — R7301 Impaired fasting glucose: Secondary | ICD-10-CM

## 2016-12-07 DIAGNOSIS — E119 Type 2 diabetes mellitus without complications: Secondary | ICD-10-CM

## 2016-12-07 DIAGNOSIS — I10 Essential (primary) hypertension: Secondary | ICD-10-CM | POA: Diagnosis not present

## 2016-12-07 DIAGNOSIS — Z23 Encounter for immunization: Secondary | ICD-10-CM

## 2016-12-07 LAB — POCT GLYCOSYLATED HEMOGLOBIN (HGB A1C): Hemoglobin A1C: 6.5

## 2016-12-07 MED ORDER — METFORMIN HCL ER 500 MG PO TB24
500.0000 mg | ORAL_TABLET | Freq: Every day | ORAL | 1 refills | Status: DC
Start: 2016-12-07 — End: 2017-03-09

## 2016-12-07 MED ORDER — BLOOD GLUCOSE MONITOR KIT: PACK | 0 refills | Status: DC

## 2016-12-07 NOTE — Progress Notes (Signed)
Subjective:    CC: HTN, IFG  HPI:  Hypertension- Pt denies chest pain, SOB, dizziness, or heart palpitations.  Taking meds as directed w/o problems.  Denies medication side effects.    Impaired fasting glucose-no increased thirst or urination. No symptoms consistent with hypoglycemia.He has not been able to exercise regularly. He actually has had a right knee replacement but unfortunately parted in your placement is actually coming loose and he can have to have a revision done. Plus he is also trying to schedule rotator cuff surgery for his right shoulder and he has an ulnar neuropathy in his left arm.   Past medical history, Surgical history, Family history not pertinant except as noted below, Social history, Allergies, and medications have been entered into the medical record, reviewed, and corrections made.   Review of Systems: No fevers, chills, night sweats, weight loss, chest pain, or shortness of breath.   Objective:    General: Well Developed, well nourished, and in no acute distress.  Neuro: Alert and oriented x3, extra-ocular muscles intact, sensation grossly intact.  HEENT: Normocephalic, atraumatic  Skin: Warm and dry, no rashes. Cardiac: Regular rate and rhythm, no murmurs rubs or gallops, no lower extremity edema.  Respiratory: Clear to auscultation bilaterally. Not using accessory muscles, speaking in full sentences.   Impression and Recommendations:   HTN - Well controlled. Continue current regimen. Follow up in  6 months.   DM. New dx today.  Start metformin. Get glucometer. REfer for diabetes and nutrition classes. Discussed new Dx and need for diet and exercise.   Lab Results  Component Value Date   HGBA1C 6.5 12/07/2016

## 2016-12-08 ENCOUNTER — Telehealth: Payer: Self-pay

## 2016-12-08 DIAGNOSIS — E119 Type 2 diabetes mellitus without complications: Secondary | ICD-10-CM | POA: Insufficient documentation

## 2016-12-08 MED ORDER — ONETOUCH DELICA LANCETS FINE MISC: 12 refills | Status: DC

## 2016-12-08 MED ORDER — GLUCOSE BLOOD VI STRP: ORAL_STRIP | 12 refills | Status: DC

## 2016-12-08 MED ORDER — ONETOUCH ULTRA SYSTEM W/DEVICE KIT: PACK | 0 refills | Status: DC

## 2016-12-08 NOTE — Telephone Encounter (Signed)
Let patient know to come transferred to scheduling

## 2016-12-08 NOTE — Addendum Note (Signed)
Addended by: Teddy Spike on: 12/08/2016 12:17 PM   Modules accepted: Orders

## 2016-12-08 NOTE — Addendum Note (Signed)
Addended by: Teddy Spike on: 12/08/2016 04:56 PM   Modules accepted: Orders

## 2016-12-08 NOTE — Telephone Encounter (Signed)
Pt called said he had the pneumonia vaccine here yesterday.  He stated today that he is having chills, low grade fever, nausea, and pain at the site.  He has taken tylenol, and is asking if he needs to be concerned.  Please advise.

## 2016-12-08 NOTE — Telephone Encounter (Signed)
Error

## 2016-12-08 NOTE — Telephone Encounter (Signed)
Have him come in for nurse visit to check the site of injection and document.

## 2016-12-11 ENCOUNTER — Ambulatory Visit: Payer: Self-pay

## 2016-12-15 ENCOUNTER — Ambulatory Visit (INDEPENDENT_AMBULATORY_CARE_PROVIDER_SITE_OTHER): Payer: Medicare Other | Admitting: Psychiatry

## 2016-12-15 DIAGNOSIS — F331 Major depressive disorder, recurrent, moderate: Secondary | ICD-10-CM

## 2016-12-15 DIAGNOSIS — F5102 Adjustment insomnia: Secondary | ICD-10-CM | POA: Diagnosis not present

## 2016-12-15 DIAGNOSIS — Z79899 Other long term (current) drug therapy: Secondary | ICD-10-CM | POA: Diagnosis not present

## 2016-12-15 DIAGNOSIS — F411 Generalized anxiety disorder: Secondary | ICD-10-CM | POA: Diagnosis not present

## 2016-12-15 MED ORDER — BUSPIRONE HCL 10 MG PO TABS: 10.0000 mg | ORAL_TABLET | Freq: Every day | ORAL | 1 refills | Status: DC

## 2016-12-15 MED ORDER — TRAZODONE HCL 50 MG PO TABS: 50.0000 mg | ORAL_TABLET | Freq: Every day | ORAL | 0 refills | Status: DC

## 2016-12-15 MED ORDER — SERTRALINE HCL 50 MG PO TABS: 75.0000 mg | ORAL_TABLET | Freq: Every day | ORAL | 1 refills | Status: DC

## 2016-12-15 NOTE — Progress Notes (Signed)
Patient ID: Janai Brannigan, male   DOB: 1951/08/22, 66 y.o.   MRN: [id number]   [facility] Follow-up Outpatient Visit  Keelyn Fjelstad [birth year]  Date: 12/15/2016    History of Chief Complaint:   HPI Comments: Mr. Brosch is a 66 y/o male with a past psychiatric history significant for Major Depressive Disorder. GAD, insomnia.  The patient returns for psychiatric services for medication management.   Patient has stress related to his wife who is trying to help her daughters but she is involved in drug dependency. She also had a heart surgery because of infection of the walls This affects him and he is feeling somewhat anxious but overall trying to work on weight loss to manage his diabetes keeping himself active Depression is not worse but it fluctuates depending on anxiety and stress related to the psychosocial situation at home  No side effects Depression: somewhat low 6/10. 10 being no depression Anxiety- has worsened  . Duration: fluctuating depression more then 10 years.    . Modifying factors: Mood improves with spending time with family.     Review of Systems  Cardiovascular: Negative for palpitations.  Skin: Negative for rash.  Neurological: Negative for tingling, tremors and headaches.  Psychiatric/Behavioral: Negative for depression and suicidal ideas. The patient is nervous/anxious.       There were no vitals filed for this visit. Physical Exam  Vitals reviewed.  Constitutional: He appears well-developed and well-nourished. No distress.  Skin: He is not diaphoretic.       Past Medical History: Reviewed  Past Medical History:  Diagnosis Date  . Adenomatous colon polyp   . Allergy   . Anxiety   . CRPS (complex regional pain syndrome), upper limb   . Depression   . Diverticulitis 2013  . Diverticulosis   . Diverticulosis   . GERD (gastroesophageal reflux disease)   . Glaucoma   . History of Helicobacter pylori infection   .  Hyperlipidemia   . Hypertension    no per pt  . Intestinal metaplasia of gastric mucosa   . Ligament tear of upper extremity April 2008   Left elbow  . Nephrolithiasis   . Renal calculi   . Renal disorder       Allergies: Reviewed  Allergies  Allergen Reactions  . Adhesive [Tape]     Hives on area wear tape applied  . Hydrocodone-Acetaminophen Itching  . Hydrocodone Rash   Current Medications: Reviewed  Current Outpatient Prescriptions on File Prior to Visit  Medication Sig Dispense Refill  . Blood Glucose Monitoring Suppl (ONE TOUCH ULTRA SYSTEM KIT) w/Device KIT For testing blood sugars daily Dx: E11.9 1 each 0  . gabapentin (NEURONTIN) 600 MG tablet Take 600 tablets by mouth 2 (two) times daily.     [name] glucose blood test strip For testing BS once daily. DX: E11.9 100 each 12  . LUMIGAN 0.01 % SOLN     . metFORMIN (GLUCOPHAGE-XR) 500 MG 24 hr tablet Take 1 tablet (500 mg total) by mouth daily with breakfast. 90 tablet 1  . naproxen (NAPROSYN) 500 MG tablet Take by mouth.    . Omega-3 Fatty Acids (FISH OIL PO) Take by mouth daily.    Glory Rosebush DELICA LANCETS FINE MISC For testing blood sugars once daily Dx:E11.9 100 each 12  . VOLTAREN 1 % GEL   5   No current facility-administered medications on file prior to visit.       Family History: Reviewed  Family History  Problem Relation Age of Onset  . Lung cancer Father   . Kidney disease Mother   . Colon cancer Neg Hx   . Esophageal cancer Neg Hx   . Rectal cancer Neg Hx   . Stomach cancer Neg Hx     Psychiatric specialty examination:  Objective: Appearance: Casual and Well Groomed   Eye Contact:: Good   Speech: Clear and Coherent and Normal Rate   Volume: Normal   Mood: stressed  Affect: Appropriate and Congruent   Thought Process: Coherent, Logical and Loose   Orientation: Full   Thought Content: WDL   Suicidal Thoughts: No   Homicidal Thoughts: No   Judgement: Fair   Insight: Fair   Psychomotor  Activity: Normal   Akathisia: No   Handed: Right   Memory: Immediate 3/3, recent 3/3   Concentration: Good   AIMS (if indicated): Not Indicated   Assets: Surveyor, mining  Housing  Social Support    Assessment:  AXIS I  Major Depressive Disorder, recurrent, moderate. GAD. Insomnia   Treatment Plan/Recommendations:  Plan of Care:  PLAN:  1. Affirm with the patient that the medications are taken as ordered. Patient  expressed understanding of how their medications were to be used.    Laboratory:  No labs warranted at this time.    Psychotherapy: Therapy: brief supportive therapy provided.  Discussed psychosocial stressors in detail and spent more then 50% of time  Medications:  Continue current medication dosages. No treatment change done to last visit.  Anxiety: worsened. Increase zoloft 55m. Keep taking buspar more reglurly Major depression: zoloft increase as above Insomnia: reviewed sleep hygiene. Continue trazadone.  Provided supportive therapy and reviewed concerns. Work on cRadiographer, therapeuticFU 2-3 months.            NMerian Capron MD  12/15/2016 12:13 PM

## 2016-12-18 ENCOUNTER — Other Ambulatory Visit: Payer: Self-pay | Admitting: Family Medicine

## 2016-12-18 NOTE — Telephone Encounter (Signed)
Call patient: Unknown he was on atorvastatin which is generic for Lipitor at one point in time a couple years ago. Because of his diabetes he really does need to be on a statin to reduce his risk for heart attack and stroke. I don't remember if he had a reaction to the medication or if he just came off of it at some point. If he did have a reaction please let me know so that it can be added to the intolerance list.  Beatrice Lecher, MD

## 2016-12-19 MED ORDER — ATORVASTATIN CALCIUM 40 MG PO TABS: 40.0000 mg | ORAL_TABLET | Freq: Every day | ORAL | 3 refills | Status: DC

## 2016-12-19 NOTE — Telephone Encounter (Signed)
Pt doesn't recall having a reaction to this medication and is uncertain why he doesn't take this anymore. He is ok with restart.Nathan [name] He was taking 40mg  will pend order and route to pcp.[name]

## 2016-12-22 ENCOUNTER — Emergency Department (INDEPENDENT_AMBULATORY_CARE_PROVIDER_SITE_OTHER): Payer: Medicare Other

## 2016-12-22 ENCOUNTER — Other Ambulatory Visit: Payer: Self-pay

## 2016-12-22 ENCOUNTER — Emergency Department (INDEPENDENT_AMBULATORY_CARE_PROVIDER_SITE_OTHER)
Admission: EM | Admit: 2016-12-22 | Discharge: 2016-12-22 | Disposition: A | Discharge status: Home / Self Care | Payer: Medicare Other | Source: Home / Self Care | Attending: Family Medicine | Admitting: Family Medicine

## 2016-12-22 ENCOUNTER — Encounter: Payer: Self-pay | Admitting: *Deleted

## 2016-12-22 DIAGNOSIS — R0781 Pleurodynia: Secondary | ICD-10-CM | POA: Diagnosis not present

## 2016-12-22 DIAGNOSIS — R0789 Other chest pain: Secondary | ICD-10-CM

## 2016-12-22 NOTE — ED Triage Notes (Signed)
Patient c/o intermittent left sided sharp CP since yesterday. Increased in frequency today. No new strenuous activity or illness. No associated symptoms. He reports reaction to PNA vaccine 1 week ago, with fever and tremors. Started Lipitor 2 days ago.

## 2016-12-22 NOTE — ED Provider Notes (Signed)
[name]    CSN: [id number] Arrival date & time: 12/22/16  1953     History   Chief Complaint Chief Complaint  Patient presents with  . Chest Pain    HPI Nathan [name] is a 66 y.o. male.   Patient complains of onset of intermittent brief stabbing pain in his left anterior chest yesterday.  The pain occurs at rest and activity.  No pleuritic pain or shortness of breath.  No nausea/vomiting.  He recalls no injury to his chest, and he feels well otherwise.   The history is provided by the patient.  Chest Pain  Pain location:  L chest Pain quality: stabbing   Pain radiates to:  Does not radiate Pain severity:  Mild Onset quality:  Sudden Duration:  1 day Timing:  Sporadic Progression:  Unchanged Chronicity:  New Context: at rest   Context: not breathing, not eating, not lifting, not movement, not raising an arm, not stress and not trauma   Relieved by:  Nothing Worsened by:  Nothing Ineffective treatments:  None tried Associated symptoms: no abdominal pain, no AICD problem, no anorexia, no back pain, no claudication, no cough, no diaphoresis, no dizziness, no dysphagia, no fatigue, no fever, no headache, no heartburn, no lower extremity edema, no nausea, no near-syncope, no numbness, no orthopnea, no palpitations, no PND, no shortness of breath, no syncope and no weakness   Risk factors: diabetes mellitus     Past Medical History:  Diagnosis Date  . Adenomatous colon polyp   . Allergy   . Anxiety   . CRPS (complex regional pain syndrome), upper limb   . Depression   . Diverticulitis 2013  . Diverticulosis   . Diverticulosis   . GERD (gastroesophageal reflux disease)   . Glaucoma   . History of Helicobacter pylori infection   . Hyperlipidemia   . Hypertension    no per pt  . Intestinal metaplasia of gastric mucosa   . Ligament tear of upper extremity April 2008   Left elbow  . Nephrolithiasis   . Renal calculi   . Renal disorder      Patient Active Problem List   Diagnosis Date Noted  . Diabetes mellitus, type II ([place]) 12/08/2016  . IFG (impaired fasting glucose) 05/27/2014  . Major depressive disorder, recurrent ([place]) 04/12/2012  . CAUSALGIA OF UPPER LIMB 08/02/2010  . HYPERTENSION, BENIGN, MILD 08/02/2010  . ANEMIA 05/11/2010  . NEUROPATHY 08/10/2009  . NEPHROLITHIASIS 05/22/2009  . GERD 06/23/2008  . ALLERGIC RHINITIS 01/14/2008  . Hyperlipidemia 06/28/2007  . FATTY LIVER DISEASE 06/28/2007  . DISEASE, CYSTIC KIDNEY NOS, CONGENITAL 06/28/2007  . MDD (major depressive disorder) 06/17/2007    Past Surgical History:  Procedure Laterality Date  . LITHOTRIPSY  2004   kidney stones  . REPLACEMENT TOTAL KNEE Right 2014  . ROTATOR CUFF REPAIR Left 2011  . ULNAR NERVE REPAIR Left 2009, 2010,   3 surgeries       Home Medications    Prior to Admission medications   Medication Sig Start Date End Date Taking? Authorizing Provider  atorvastatin (LIPITOR) 40 MG tablet Take 1 tablet (40 mg total) by mouth daily. 12/19/16   Hali Marry, MD  Blood Glucose Monitoring Suppl (ONE TOUCH ULTRA SYSTEM KIT) w/Device KIT For testing blood sugars daily Dx: E11.9 12/08/16   Hali Marry, MD  busPIRone (BUSPAR) 10 MG tablet Take 1 tablet (10 mg total) by mouth daily. 12/15/16   [name], MD  gabapentin (NEURONTIN)  600 MG tablet Take 600 tablets by mouth 2 (two) times daily.  06/29/15   Historical Provider, MD  glucose blood test strip For testing BS once daily. DX: E11.9 12/08/16   Hali Marry, MD  LUMIGAN 0.01 % SOLN  12/20/11   Historical Provider, MD  metFORMIN (GLUCOPHAGE-XR) 500 MG 24 hr tablet Take 1 tablet (500 mg total) by mouth daily with breakfast. 12/07/16   Hali Marry, MD  naproxen (NAPROSYN) 500 MG tablet Take by mouth. 11/08/16 05/07/17  Historical Provider, MD  Omega-3 Fatty Acids (FISH OIL PO) Take by mouth daily.    Historical Provider, MD  [facility] DELICA LANCETS FINE MISC  For testing blood sugars once daily Dx:E11.9 12/08/16   Hali Marry, MD  sertraline (ZOLOFT) 50 MG tablet Take 1.5 tablets (75 mg total) by mouth daily. Take one a day 12/15/16   [name], MD  traZODone (DESYREL) 50 MG tablet Take 1 tablet (50 mg total) by mouth at bedtime. 12/15/16   [name], MD  VOLTAREN 1 % GEL  12/30/14   Historical Provider, MD    Family History Family History  Problem Relation Age of Onset  . Lung cancer Father   . Kidney disease Mother   . Colon cancer Neg Hx   . Esophageal cancer Neg Hx   . Rectal cancer Neg Hx   . Stomach cancer Neg Hx     Social History Social History  Substance Use Topics  . Smoking status: Former Smoker    Packs/day: 1.00    Years: 10.00    Types: Cigarettes    Quit date: 09/25/1981  . Smokeless tobacco: Never Used  . Alcohol use 0.6 oz/week    1 Cans of beer per week     Comment: 1-2 per wk     Allergies   Adhesive [tape]; Hydrocodone-acetaminophen; and Hydrocodone   Review of Systems Review of Systems  Constitutional: Negative for diaphoresis, fatigue and fever.  HENT: Negative for trouble swallowing.   Respiratory: Negative for cough and shortness of breath.   Cardiovascular: Positive for chest pain. Negative for palpitations, orthopnea, claudication, syncope, PND and near-syncope.  Gastrointestinal: Negative for abdominal pain, anorexia, heartburn and nausea.  Musculoskeletal: Negative for back pain.  Neurological: Negative for dizziness, weakness, numbness and headaches.     Physical Exam Triage Vital Signs ED Triage Vitals  Enc Vitals Group     BP 12/22/16 2017 (!) 148/85     Pulse Rate 12/22/16 2017 70     Resp 12/22/16 2018 14     Temp 12/22/16 2017 98.3 F (36.8 C)     Temp Source 12/22/16 2017 Oral     SpO2 12/22/16 2017 98 %     Weight 12/22/16 2017 179 lb (81.2 kg)     Height --      Head Circumference --      Peak Flow --      Pain Score 12/22/16 2018 0     Pain Loc --      Pain  Edu? --      Excl. in [place]? --    No data found.   Updated Vital Signs BP (!) 148/85 (BP Location: Left Arm)   Pulse 70   Temp 98.3 F (36.8 C) (Oral)   Resp 14   Wt 179 lb (81.2 kg)   SpO2 98%   BMI 28.89 kg/m   Visual Acuity Right Eye Distance:   Left Eye Distance:   Bilateral Distance:  Right Eye Near:   Left Eye Near:    Bilateral Near:     Physical Exam  Constitutional: He appears well-developed and well-nourished. No distress.  HENT:  Head: Normocephalic.  Right Ear: External ear normal.  Left Ear: External ear normal.  Nose: Nose normal.  Mouth/Throat: Oropharynx is clear and moist.  Eyes: EOM are normal. Pupils are equal, round, and reactive to light.  Neck: Normal range of motion. Neck supple.  Cardiovascular: Normal heart sounds.   Pulmonary/Chest: Breath sounds normal.    Distinct tenderness to palpation left lateral chest as noted on diagram.  Palpation there recreates his discomfort.  Abdominal: There is no tenderness.  Musculoskeletal: He exhibits no edema or tenderness.  Lymphadenopathy:    He has no cervical adenopathy.  Neurological: He is alert.  Skin: Skin is warm and dry. No rash noted. He is not diaphoretic.  Nursing note and vitals reviewed.    UC Treatments / Results  Labs (all labs ordered are listed, but only abnormal results are displayed) Labs Reviewed - No data to display  EKG  EKG Interpretation None       Radiology Dg Ribs Unilateral W/chest Left  Result Date: 12/22/2016 CLINICAL DATA:  Left-sided rib pain EXAM: LEFT RIBS AND CHEST - 3+ VIEW COMPARISON:  None FINDINGS: No fracture or other bone lesions are seen involving the ribs. There is no evidence of pneumothorax or pleural effusion. Both lungs are clear. Heart size and mediastinal contours are within normal limits. IMPRESSION: Negative. Electronically Signed   By: Kerby Moors M.D.   On: 12/22/2016 20:55    Procedures Procedures (including critical care  time)  Medications Ordered in UC Medications - No data to display   Initial Impression / Assessment and Plan / UC Course  I have reviewed the triage vital signs and the nursing notes.  Pertinent labs & imaging results that were available during my care of the patient were reviewed by me and considered in my medical decision making (see chart for details).    EKG shows no acute changes, and no significant change from 24 March 2013 Negative chest X-ray reassuring. Suspect costochondritis. Apply ice pack for 20 to 30 minutes, 3 to 4 times daily  Continue until pain and swelling decrease.  May take Naproxen 562m, one tab every 12 hours with food. Followup with Family Doctor if not improved in one week.     Final Clinical Impressions(s) / UC Diagnoses   Final diagnoses:  Chest wall pain    New Prescriptions New Prescriptions   No medications on file     SKandra Nicolas MD 12/23/16 1005

## 2016-12-22 NOTE — Discharge Instructions (Signed)
Apply ice pack for 20 to 30 minutes, 3 to 4 times daily  Continue until pain and swelling decrease.  May take Naproxen 500mg , one tab every 12 hours with food.

## 2016-12-25 ENCOUNTER — Telehealth: Payer: Self-pay

## 2016-12-25 NOTE — Telephone Encounter (Signed)
Feeling better, ended up having a kidney stone that he passed Saturday.

## 2017-01-11 DIAGNOSIS — E119 Type 2 diabetes mellitus without complications: Secondary | ICD-10-CM | POA: Diagnosis not present

## 2017-01-29 DIAGNOSIS — H43393 Other vitreous opacities, bilateral: Secondary | ICD-10-CM | POA: Diagnosis not present

## 2017-01-29 DIAGNOSIS — H35373 Puckering of macula, bilateral: Secondary | ICD-10-CM | POA: Diagnosis not present

## 2017-01-29 DIAGNOSIS — H401133 Primary open-angle glaucoma, bilateral, severe stage: Secondary | ICD-10-CM | POA: Diagnosis not present

## 2017-01-29 DIAGNOSIS — H2513 Age-related nuclear cataract, bilateral: Secondary | ICD-10-CM | POA: Diagnosis not present

## 2017-01-29 LAB — HM DIABETES EYE EXAM

## 2017-02-07 ENCOUNTER — Other Ambulatory Visit (HOSPITAL_COMMUNITY): Payer: Self-pay | Admitting: Psychiatry

## 2017-02-07 NOTE — Telephone Encounter (Signed)
Received fax from [facility] requesting a refill for Zoloft. Per Dr. De Nurse, refill authorize for Zoloft 50mg , #45. Rx was sent to pharmacy. Called and informed pt of refill status. Pt's next apt is schedule on 03/08/17. Pt shows understanding.

## 2017-03-02 ENCOUNTER — Encounter: Payer: Self-pay | Admitting: Family Medicine

## 2017-03-08 ENCOUNTER — Ambulatory Visit (INDEPENDENT_AMBULATORY_CARE_PROVIDER_SITE_OTHER): Payer: Medicare Other | Admitting: Psychiatry

## 2017-03-08 ENCOUNTER — Encounter (HOSPITAL_COMMUNITY): Payer: Self-pay | Admitting: Psychiatry

## 2017-03-08 VITALS — BP 126/76 | HR 68 | Resp 16 | Ht 66.0 in | Wt 167.0 lb

## 2017-03-08 DIAGNOSIS — Z79899 Other long term (current) drug therapy: Secondary | ICD-10-CM

## 2017-03-08 DIAGNOSIS — G4700 Insomnia, unspecified: Secondary | ICD-10-CM | POA: Diagnosis not present

## 2017-03-08 DIAGNOSIS — F331 Major depressive disorder, recurrent, moderate: Secondary | ICD-10-CM

## 2017-03-08 DIAGNOSIS — Z885 Allergy status to narcotic agent status: Secondary | ICD-10-CM | POA: Diagnosis not present

## 2017-03-08 DIAGNOSIS — Z7984 Long term (current) use of oral hypoglycemic drugs: Secondary | ICD-10-CM

## 2017-03-08 DIAGNOSIS — F5102 Adjustment insomnia: Secondary | ICD-10-CM

## 2017-03-08 DIAGNOSIS — F411 Generalized anxiety disorder: Secondary | ICD-10-CM

## 2017-03-08 DIAGNOSIS — Z791 Long term (current) use of non-steroidal anti-inflammatories (NSAID): Secondary | ICD-10-CM | POA: Diagnosis not present

## 2017-03-08 MED ORDER — BUSPIRONE HCL 10 MG PO TABS: 10.0000 mg | ORAL_TABLET | Freq: Every day | ORAL | 1 refills | Status: DC

## 2017-03-08 MED ORDER — TRAZODONE HCL 50 MG PO TABS: 50.0000 mg | ORAL_TABLET | Freq: Every day | ORAL | 1 refills | Status: DC

## 2017-03-08 MED ORDER — SERTRALINE HCL 50 MG PO TABS: 75.0000 mg | ORAL_TABLET | Freq: Every day | ORAL | 2 refills | Status: DC

## 2017-03-08 NOTE — Progress Notes (Signed)
Patient ID: Nathan [name], male   DOB: 09/07/1951, 66 y.o.   MRN: [id number]   [place] Follow-up Outpatient Visit  Nathan [name] 1951-03-26  Date: 03/08/2017    History of Chief Complaint:   HPI Comments: Nathan [name] is a 66 y/o male with a past psychiatric history significant for Major Depressive Disorder. GAD, insomnia.  The patient returns for psychiatric services for medication management.   Tolerating medications still gets anxious about his wife who worries about her daughter. Trying to work on weight loss so  He does not have to take diabetic medication and he has lost 10 pounds. Activity level has increased depression is improving  Sleep is adequate on trazadone Has to take BuSpar once a day at the minimal's for anxiety  Modifying factors are mood improves his spending time with family  . Duration: fluctuating depression more then 10 years.      Review of Systems  Cardiovascular: Negative for chest pain.  Skin: Negative for rash.  Neurological: Negative for tingling, tremors and headaches.  Psychiatric/Behavioral: Negative for depression and suicidal ideas.      Vitals:   03/08/17 1035  BP: 126/76  Pulse: 68  Resp: 16  SpO2: 92%  Weight: 167 lb (75.8 kg)  Height: '5\' 6"'  (1.676 m)   Physical Exam  Vitals reviewed.  Constitutional: He appears well-developed and well-nourished. No distress.  Skin: He is not diaphoretic.       Past Medical History: Reviewed  Past Medical History:  Diagnosis Date  . Adenomatous colon polyp   . Allergy   . Anxiety   . CRPS (complex regional pain syndrome), upper limb   . Depression   . Diverticulitis 2013  . Diverticulosis   . Diverticulosis   . GERD (gastroesophageal reflux disease)   . Glaucoma   . History of Helicobacter pylori infection   . Hyperlipidemia   . Hypertension    no per pt  . Intestinal metaplasia of gastric mucosa   . Ligament tear of upper extremity April 2008   Left  elbow  . Nephrolithiasis   . Renal calculi   . Renal disorder       Allergies: Reviewed  Allergies  Allergen Reactions  . Adhesive [Tape]     Hives on area wear tape applied  . Hydrocodone-Acetaminophen Itching  . Hydrocodone Rash   Current Medications: Reviewed  Current Outpatient Prescriptions on File Prior to Visit  Medication Sig Dispense Refill  . atorvastatin (LIPITOR) 40 MG tablet Take 1 tablet (40 mg total) by mouth daily. 90 tablet 3  . Blood Glucose Monitoring Suppl (ONE TOUCH ULTRA SYSTEM KIT) w/Device KIT For testing blood sugars daily Dx: E11.9 1 each 0  . gabapentin (NEURONTIN) 600 MG tablet Take 600 tablets by mouth 2 (two) times daily.     [name] glucose blood test strip For testing BS once daily. DX: E11.9 100 each 12  . LUMIGAN 0.01 % SOLN     . metFORMIN (GLUCOPHAGE-XR) 500 MG 24 hr tablet Take 1 tablet (500 mg total) by mouth daily with breakfast. 90 tablet 1  . naproxen (NAPROSYN) 500 MG tablet Take 500 mg by mouth as needed.     . Omega-3 Fatty Acids (FISH OIL PO) Take by mouth daily.    Nathan [name] DELICA LANCETS FINE MISC For testing blood sugars once daily Dx:E11.9 100 each 12  . VOLTAREN 1 % GEL   5   No current facility-administered medications on file prior to visit.  Family History: Reviewed  Family History  Problem Relation Age of Onset  . Lung cancer Father   . Kidney disease Mother   . Colon cancer Neg Hx   . Esophageal cancer Neg Hx   . Rectal cancer Neg Hx   . Stomach cancer Neg Hx     Psychiatric specialty examination:  Objective: Appearance: Casual and Well Groomed   Eye Contact:: Good   Speech: Clear and Coherent and Normal Rate   Volume: Normal   Mood: fair  Affect: pleasant   Thought Process: Coherent, Logical and Loose   Orientation: Full   Thought Content: WDL   Suicidal Thoughts: No   Homicidal Thoughts: No   Judgement: Fair   Insight: Fair   Psychomotor Activity: Normal   Akathisia: No   Handed: Right   Memory:  Immediate 3/3, recent 3/3   Concentration: Good   AIMS (if indicated): Not Indicated   Assets: Surveyor, mining  Housing  Social Support    Assessment:  AXIS I  Major Depressive Disorder, recurrent, moderate. GAD. Insomnia   Treatment Plan/Recommendations:  Plan of Care:  PLAN:  1. Affirm with the patient that the medications are taken as ordered. Patient  expressed understanding of how their medications were to be used.    Laboratory:  No labs warranted at this time.    Psychotherapy: Therapy: brief supportive therapy provided.  Discussed psychosocial stressors in detail and spent more then 50% of time  Medications:  Continue current medication dosages. No treatment change done to last visit.  Anxiety: balance, at times can get high. Continue zoloft and buspar Major depression: mild. Continue zoloft Insomnia: fair . Continue trazadone  Questions addressed, no side effects FU 3 months. Renewed meds           [name], MD  03/08/2017 10:53 AM

## 2017-03-09 ENCOUNTER — Ambulatory Visit (INDEPENDENT_AMBULATORY_CARE_PROVIDER_SITE_OTHER): Payer: Medicare Other | Admitting: Family Medicine

## 2017-03-09 ENCOUNTER — Encounter: Payer: Self-pay | Admitting: Family Medicine

## 2017-03-09 VITALS — BP 114/67 | HR 79 | Ht 65.0 in | Wt 167.0 lb

## 2017-03-09 DIAGNOSIS — I10 Essential (primary) hypertension: Secondary | ICD-10-CM | POA: Diagnosis not present

## 2017-03-09 DIAGNOSIS — E119 Type 2 diabetes mellitus without complications: Secondary | ICD-10-CM

## 2017-03-09 LAB — POCT UA - MICROALBUMIN
Albumin/Creatinine Ratio, Urine, POC: <30
Creatinine, POC: 200 mg/dL
Microalbumin Ur, POC: 30 mg/L

## 2017-03-09 LAB — POCT GLYCOSYLATED HEMOGLOBIN (HGB A1C): Hemoglobin A1C: 5.6

## 2017-03-09 MED ORDER — METFORMIN HCL ER 500 MG PO TB24: 500.0000 mg | ORAL_TABLET | Freq: Every day | ORAL | 1 refills | Status: DC

## 2017-03-09 NOTE — Patient Instructions (Signed)
Start aspirin 81mg  once a day for heart protection

## 2017-03-09 NOTE — Progress Notes (Signed)
Subjective:    CC:   HPI: Diabetes - Newly diagnosed 3 months ago after following him for quite some time for impaired fasting glucose. Started him on metformin. no hypoglycemic events. No wounds or sores that are not healing well. No increased thirst or urination. Checking glucose at home. Taking medications as prescribed without any side effects.  Reports eye exam is UTD. Eye exam done in May.  He has cut back on his rice and Pepsi intake.   He has actually lost 12 pounds and BMI is now down to 27.  Hypertension- Pt denies chest pain, SOB, dizziness, or heart palpitations.  Taking meds as directed w/o problems.  Denies medication side effects.      Past medical history, Surgical history, Family history not pertinant except as noted below, Social history, Allergies, and medications have been entered into the medical record, reviewed, and corrections made.   Review of Systems: No fevers, chills, night sweats, weight loss, chest pain, or shortness of breath.   Objective:    General: Well Developed, well nourished, and in no acute distress.  Neuro: Alert and oriented x3, extra-ocular muscles intact, sensation grossly intact.  HEENT: Normocephalic, atraumatic  Skin: Warm and dry, no rashes. Cardiac: Regular rate and rhythm, no murmurs rubs or gallops, no lower extremity edema.  Respiratory: Clear to auscultation bilaterally. Not using accessory muscles, speaking in full sentences.   Impression and Recommendations:   DM-  Urine micro performed today. F/U in 3-4 months.  A1c looks fantastic at 5.6. Negative microalbumin today. Foot exam performed as well. Continue current regimen. His really made some fantastic changes. Graduated him on some of his changes and on his weight loss.   Hypertension-well-controlled. Continue current regimen.

## 2017-05-29 DIAGNOSIS — H35373 Puckering of macula, bilateral: Secondary | ICD-10-CM | POA: Diagnosis not present

## 2017-05-29 DIAGNOSIS — H401132 Primary open-angle glaucoma, bilateral, moderate stage: Secondary | ICD-10-CM | POA: Diagnosis not present

## 2017-05-29 DIAGNOSIS — H43393 Other vitreous opacities, bilateral: Secondary | ICD-10-CM | POA: Diagnosis not present

## 2017-05-29 DIAGNOSIS — H401133 Primary open-angle glaucoma, bilateral, severe stage: Secondary | ICD-10-CM | POA: Diagnosis not present

## 2017-05-29 DIAGNOSIS — H2513 Age-related nuclear cataract, bilateral: Secondary | ICD-10-CM | POA: Diagnosis not present

## 2017-06-08 ENCOUNTER — Ambulatory Visit (INDEPENDENT_AMBULATORY_CARE_PROVIDER_SITE_OTHER): Payer: Medicare Other | Admitting: Psychiatry

## 2017-06-08 ENCOUNTER — Encounter (HOSPITAL_COMMUNITY): Payer: Self-pay | Admitting: Psychiatry

## 2017-06-08 ENCOUNTER — Ambulatory Visit (HOSPITAL_COMMUNITY): Payer: Self-pay | Admitting: Psychiatry

## 2017-06-08 VITALS — BP 114/68 | HR 67 | Resp 16 | Ht 65.0 in | Wt 167.2 lb

## 2017-06-08 DIAGNOSIS — F5102 Adjustment insomnia: Secondary | ICD-10-CM | POA: Diagnosis not present

## 2017-06-08 DIAGNOSIS — F331 Major depressive disorder, recurrent, moderate: Secondary | ICD-10-CM

## 2017-06-08 DIAGNOSIS — F411 Generalized anxiety disorder: Secondary | ICD-10-CM

## 2017-06-08 MED ORDER — SERTRALINE HCL 50 MG PO TABS: 75.0000 mg | ORAL_TABLET | Freq: Every day | ORAL | 3 refills | Status: DC

## 2017-06-08 MED ORDER — BUSPIRONE HCL 10 MG PO TABS: 10.0000 mg | ORAL_TABLET | Freq: Every day | ORAL | 3 refills | Status: DC

## 2017-06-08 MED ORDER — TRAZODONE HCL 50 MG PO TABS: 50.0000 mg | ORAL_TABLET | Freq: Every day | ORAL | 1 refills | Status: DC

## 2017-06-08 NOTE — Progress Notes (Signed)
Patient ID: Nathan [name], male   DOB: August 19, 1951, 66 y.o.   MRN: [id number]   [place] Follow-up Outpatient Visit  Nathan [name] 1951-09-12  Date: 06/08/2017    History of Chief Complaint:   HPI Comments: Nathan [name] is a 66 y/o male with a past psychiatric history significant for Major Depressive Disorder. GAD, insomnia.  The patient returns for psychiatric services for medication management.   Returns for a follow-up appointment worried about his wife who has started a new job and was having panic-like symptoms but she is doing better for the last 2 days. Other than that tolerating medication sleep is reasonable on trazodone mood is not unstable thus gets stressed out at times. Activity level is fine  Anxiety is manageable  Has to take BuSpar once a day at the minimal's for anxiety  Modifying factors ; family Aggravating factors: his wife daughter. finances . Duration: fluctuating depression more then 10 years.      Review of Systems  Cardiovascular: Negative for palpitations.  Skin: Negative for rash.  Neurological: Negative for tingling, tremors and headaches.  Psychiatric/Behavioral: Negative for depression and suicidal ideas.      Vitals:   06/08/17 1042  BP: 114/68  Pulse: 67  Resp: 16  SpO2: 95%  Weight: 167 lb 3.2 oz (75.8 kg)  Height: 5' 5" (1.651 m)   Physical Exam  Vitals reviewed.  Constitutional: He appears well-developed and well-nourished. No distress.  Skin: He is not diaphoretic.       Past Medical History: Reviewed  Past Medical History:  Diagnosis Date  . Adenomatous colon polyp   . Allergy   . Anxiety   . CRPS (complex regional pain syndrome), upper limb   . Depression   . Diverticulitis 2013  . Diverticulosis   . Diverticulosis   . GERD (gastroesophageal reflux disease)   . Glaucoma   . History of Helicobacter pylori infection   . Hyperlipidemia   . Hypertension    no per pt  . Intestinal metaplasia of  gastric mucosa   . Ligament tear of upper extremity April 2008   Left elbow  . Nephrolithiasis   . Renal calculi   . Renal disorder       Allergies: Reviewed  Allergies  Allergen Reactions  . Adhesive [Tape]     Hives on area wear tape applied  . Hydrocodone-Acetaminophen Itching  . Hydrocodone Rash   Current Medications: Reviewed  Current Outpatient Prescriptions on File Prior to Visit  Medication Sig Dispense Refill  . atorvastatin (LIPITOR) 40 MG tablet Take 1 tablet (40 mg total) by mouth daily. 90 tablet 3  . Blood Glucose Monitoring Suppl (ONE TOUCH ULTRA SYSTEM KIT) w/Device KIT For testing blood sugars daily Dx: E11.9 1 each 0  . gabapentin (NEURONTIN) 600 MG tablet Take 600 tablets by mouth 2 (two) times daily.     [name] glucose blood test strip For testing BS once daily. DX: E11.9 100 each 12  . LUMIGAN 0.01 % SOLN     . metFORMIN (GLUCOPHAGE-XR) 500 MG 24 hr tablet Take 1 tablet (500 mg total) by mouth daily with breakfast. 90 tablet 1  . Omega-3 Fatty Acids (FISH OIL PO) Take by mouth daily.    Nathan [name] DELICA LANCETS FINE MISC For testing blood sugars once daily Dx:E11.9 100 each 12  . VOLTAREN 1 % GEL   5   No current facility-administered medications on file prior to visit.       Family History:  Reviewed  Family History  Problem Relation Age of Onset  . Lung cancer Father   . Kidney disease Mother   . Colon cancer Neg Hx   . Esophageal cancer Neg Hx   . Rectal cancer Neg Hx   . Stomach cancer Neg Hx     Psychiatric specialty examination:  Objective: Appearance: Casual and Well Groomed   Eye Contact:: Good   Speech: Clear and Coherent and Normal Rate   Volume: Normal   Mood: euthymic  Affect: pleasant  Thought Process: Coherent, Logical and Loose   Orientation: Full   Thought Content: WDL   Suicidal Thoughts: No   Homicidal Thoughts: No   Judgement: Fair   Insight: Fair   Psychomotor Activity: Normal   Akathisia: No   Handed: Right   Memory:  Immediate 3/3, recent 3/3   Concentration: Good   AIMS (if indicated): Not Indicated   Assets: Surveyor, mining  Housing  Social Support    Assessment:  AXIS I  Major Depressive Disorder, recurrent, moderate. GAD. Insomnia   Treatment Plan/Recommendations:  Plan of Care:  PLAN:  1. Affirm with the patient that the medications are taken as ordered. Patient  expressed understanding of how their medications were to be used.    Laboratory:  No labs warranted at this time.    Psychotherapy: Therapy: brief supportive therapy provided.  Discussed psychosocial stressors in detail and spent more then 50% of time  Medications:  Continue current medication dosages. No treatment change done to last visit.  Anxiety: ; not worse. Fluctuates. Continue zoloft and buspar Major depression: fair not worse. Continue zoloft Insomnia: stable on trazadone.   Questions adressed side effects reviewed follow-up in 4 months. Prescriptions           [name], MD  06/08/2017 10:54 AM

## 2017-06-11 ENCOUNTER — Encounter: Payer: Self-pay | Admitting: Family Medicine

## 2017-06-11 ENCOUNTER — Ambulatory Visit (INDEPENDENT_AMBULATORY_CARE_PROVIDER_SITE_OTHER): Payer: Medicare Other | Admitting: Family Medicine

## 2017-06-11 VITALS — BP 115/75 | HR 69 | Wt 167.0 lb

## 2017-06-11 DIAGNOSIS — E785 Hyperlipidemia, unspecified: Secondary | ICD-10-CM | POA: Diagnosis not present

## 2017-06-11 DIAGNOSIS — E119 Type 2 diabetes mellitus without complications: Secondary | ICD-10-CM

## 2017-06-11 DIAGNOSIS — Z125 Encounter for screening for malignant neoplasm of prostate: Secondary | ICD-10-CM | POA: Diagnosis not present

## 2017-06-11 DIAGNOSIS — L309 Dermatitis, unspecified: Secondary | ICD-10-CM | POA: Diagnosis not present

## 2017-06-11 DIAGNOSIS — I10 Essential (primary) hypertension: Secondary | ICD-10-CM

## 2017-06-11 LAB — POCT GLYCOSYLATED HEMOGLOBIN (HGB A1C): Hemoglobin A1C: 5.2

## 2017-06-11 MED ORDER — ATORVASTATIN CALCIUM 40 MG PO TABS: 40.0000 mg | ORAL_TABLET | Freq: Every day | ORAL | 3 refills | Status: DC

## 2017-06-11 MED ORDER — METFORMIN HCL ER 500 MG PO TB24: 500.0000 mg | ORAL_TABLET | Freq: Every day | ORAL | 2 refills | Status: DC

## 2017-06-11 NOTE — Progress Notes (Addendum)
Subjective:    CC: HTN, DM   HPI:  Hypertension- Pt denies chest pain, SOB, dizziness, or heart palpitations.  Taking meds as directed w/o problems.  Denies medication side effects.    Diabetes - no hypoglycemic events. No wounds or sores that are not healing well. No increased thirst or urination. Checking glucose at home.  Running 95-106..  Highest is 113.  Taking medications as prescribed without any side effects.e's really felt great overall and has been trying to watch what he is eating.  Hyperlipidemia-currently on atorvastatin. Tolerating well without any side effects or myalgias.  He also has a spot on his upper left back that he would like me to look at. Sometimes he says it g He has asked his wife to look at it and she says that she never notices any bump or rash.  Past medical history, Surgical history, Family history not pertinant except as noted below, Social history, Allergies, and medications have been entered into the medical record, reviewed, and corrections made.   Review of Systems: No fevers, chills, night sweats, weight loss, chest pain, or shortness of breath.   Objective:    General: Well Developed, well nourished, and in no acute distress.  Neuro: Alert and oriented x3, extra-ocular muscles intact, sensation grossly intact.  HEENT: Normocephalic, atraumatic  Skin: Warm and dry, no rashes.kin on his upper back looks completely normal. No specific lesions or erythema or dry scaling. Cardiac: Regular rate and rhythm, no murmurs rubs or gallops, no lower extremity edema.  Respiratory: Clear to auscultation bilaterally. Not using accessory muscles, speaking in full sentences.   Impression and Recommendations:    HTN - Due for CMP. Well controlled. Continue current regimen. Follow up in  6 months.   DM- Well controlled. Continue current regimen. Follow up in  4 months.  E's really interested in stopping his metformin. He doesn't want to do it this time around that  may be the next time. His A1c was 5.2 and is fantastic. We discussed the options of holding his metformin for a period of time versus discontinuing it since his A1c looks great. He is not having any hypoglycemic events. Keep up a healthy diet and regular exercise.  Hyperlipidemia-due for repeat lipid panel.  Dermatitis-he just has an itchy spot on the left upper back. No signifant skin changes. Will treat with topical sream as neede  Due for PSA screening

## 2017-06-12 LAB — LIPID PANEL W/REFLEX DIRECT LDL
Cholesterol: 125 mg/dL (ref <200)
HDL: 46 mg/dL (ref >40)
LDL Cholesterol (Calc): 54 mg/dL (calc)
Non-HDL Cholesterol (Calc): 79 mg/dL (calc) (ref <130)
Total CHOL/HDL Ratio: 2.7 (calc) (ref <5.0)
Triglycerides: 186 mg/dL — ABNORMAL HIGH (ref <150)

## 2017-06-12 LAB — COMPLETE METABOLIC PANEL WITH GFR
AG Ratio: 1.4 (calc) (ref 1.0–2.5)
ALT: 17 U/L (ref 9–46)
AST: 22 U/L (ref 10–35)
Albumin: 4.7 g/dL (ref 3.6–5.1)
Alkaline phosphatase (APISO): 95 U/L (ref 40–115)
BUN: 13 mg/dL (ref 7–25)
CO2: 28 mmol/L (ref 20–32)
Calcium: 10.1 mg/dL (ref 8.6–10.3)
Chloride: 101 mmol/L (ref 98–110)
Creat: 0.79 mg/dL (ref 0.70–1.25)
GFR, Est African American: 109 mL/min/{1.73_m2} (ref >=60)
GFR, Est Non African American: 94 mL/min/{1.73_m2} (ref >=60)
Globulin: 3.4 g/dL (calc) (ref 1.9–3.7)
Glucose, Bld: 101 mg/dL — ABNORMAL HIGH (ref 65–99)
Potassium: 4.2 mmol/L (ref 3.5–5.3)
Sodium: 139 mmol/L (ref 135–146)
Total Bilirubin: 1.3 mg/dL — ABNORMAL HIGH (ref 0.2–1.2)
Total Protein: 8.1 g/dL (ref 6.1–8.1)

## 2017-06-12 LAB — PSA: PSA: 2.2 ng/mL (ref <=4.0)

## 2017-06-12 MED ORDER — TRIAMCINOLONE ACETONIDE 0.1 % EX CREA: 1.0000 "application " | TOPICAL_CREAM | Freq: Every day | CUTANEOUS | 0 refills | Status: DC | PRN

## 2017-06-12 NOTE — Addendum Note (Signed)
Addended by: Beatrice Lecher D on: 06/12/2017 09:28 AM   Modules accepted: Orders

## 2017-10-01 DIAGNOSIS — H35373 Puckering of macula, bilateral: Secondary | ICD-10-CM | POA: Diagnosis not present

## 2017-10-01 DIAGNOSIS — Z7984 Long term (current) use of oral hypoglycemic drugs: Secondary | ICD-10-CM | POA: Diagnosis not present

## 2017-10-01 DIAGNOSIS — H43393 Other vitreous opacities, bilateral: Secondary | ICD-10-CM | POA: Diagnosis not present

## 2017-10-01 DIAGNOSIS — E119 Type 2 diabetes mellitus without complications: Secondary | ICD-10-CM | POA: Diagnosis not present

## 2017-10-01 DIAGNOSIS — H1013 Acute atopic conjunctivitis, bilateral: Secondary | ICD-10-CM | POA: Diagnosis not present

## 2017-10-01 DIAGNOSIS — H2513 Age-related nuclear cataract, bilateral: Secondary | ICD-10-CM | POA: Diagnosis not present

## 2017-10-01 DIAGNOSIS — H401132 Primary open-angle glaucoma, bilateral, moderate stage: Secondary | ICD-10-CM | POA: Diagnosis not present

## 2017-10-01 LAB — HM DIABETES EYE EXAM

## 2017-10-05 ENCOUNTER — Encounter: Payer: Self-pay | Admitting: Family Medicine

## 2017-10-05 ENCOUNTER — Ambulatory Visit (INDEPENDENT_AMBULATORY_CARE_PROVIDER_SITE_OTHER): Payer: Medicare Other | Admitting: Family Medicine

## 2017-10-05 VITALS — BP 138/70 | HR 90 | Temp 99.3°F | Ht 65.0 in | Wt 169.0 lb

## 2017-10-05 DIAGNOSIS — J101 Influenza due to other identified influenza virus with other respiratory manifestations: Secondary | ICD-10-CM

## 2017-10-05 DIAGNOSIS — E119 Type 2 diabetes mellitus without complications: Secondary | ICD-10-CM

## 2017-10-05 DIAGNOSIS — R509 Fever, unspecified: Secondary | ICD-10-CM | POA: Diagnosis not present

## 2017-10-05 LAB — POCT INFLUENZA A/B
Influenza A, POC: POSITIVE — AB
Influenza B, POC: NEGATIVE

## 2017-10-05 LAB — POCT GLYCOSYLATED HEMOGLOBIN (HGB A1C): Hemoglobin A1C: 5.6

## 2017-10-05 MED ORDER — GLUCOSE BLOOD VI STRP: ORAL_STRIP | 12 refills | Status: DC

## 2017-10-05 MED ORDER — ONETOUCH DELICA LANCETS FINE MISC: 12 refills | Status: DC

## 2017-10-05 MED ORDER — OSELTAMIVIR PHOSPHATE 75 MG PO CAPS: 75.0000 mg | ORAL_CAPSULE | Freq: Two times a day (BID) | ORAL | 0 refills | Status: DC

## 2017-10-05 NOTE — Patient Instructions (Addendum)

## 2017-10-05 NOTE — Progress Notes (Signed)
Subjective:    Patient ID: Nathan [name], male    DOB: 10-22-1950, 67 y.o.   MRN: [id number]  HPI 67 year old male comes in today complaining of not feeling well for about 2 days he said starting yesterday he started having a runny nose dry cough and last night his temperature was 101.5.  He denies any nausea vomiting or diarrhea.  He has a significantly decreased appetite.  He denies any shortness of breath.  He has been taking some Robitussin and using some cough drops.  He said he last took the Robitussin about an hour ago.  Diabetes - no hypoglycemic events. No wounds or sores that are not healing well. No increased thirst or urination. Checking glucose at home.  Because his hemoglobin A1c was low at 5.2 and he was doing really well we decided to stop his metformin at last office visit in September.   Review of Systems   BP 138/70   Pulse 90   Temp 99.3 F (37.4 C)   Ht '5\' 5"'  (1.651 m)   Wt 169 lb (76.7 kg)   SpO2 98%   BMI 28.12 kg/m     Allergies  Allergen Reactions  . Adhesive [Tape]     Hives on area wear tape applied  . Hydrocodone-Acetaminophen Itching  . Hydrocodone Rash    Past Medical History:  Diagnosis Date  . Adenomatous colon polyp   . Allergy   . Anxiety   . CRPS (complex regional pain syndrome), upper limb   . Depression   . Diverticulitis 2013  . Diverticulosis   . Diverticulosis   . GERD (gastroesophageal reflux disease)   . Glaucoma   . History of Helicobacter pylori infection   . Hyperlipidemia   . Hypertension    no per pt  . Intestinal metaplasia of gastric mucosa   . Ligament tear of upper extremity April 2008   Left elbow  . Nephrolithiasis   . Renal calculi   . Renal disorder     Past Surgical History:  Procedure Laterality Date  . LITHOTRIPSY  2004   kidney stones  . REPLACEMENT TOTAL KNEE Right 2014  . ROTATOR CUFF REPAIR Left 2011  . ULNAR NERVE REPAIR Left 2009, 2010,   3 surgeries    Social History   Socioeconomic  History  . Marital status: Married    Spouse name: Not on file  . Number of children: 1  . Years of education: Not on file  . Highest education level: Not on file  Social Needs  . Financial resource strain: Not on file  . Food insecurity - worry: Not on file  . Food insecurity - inability: Not on file  . Transportation needs - medical: Not on file  . Transportation needs - non-medical: Not on file  Occupational History  . Occupation: AIRCRAFT INT [employer]    Employer: [employer]: Retired.   Tobacco Use  . Smoking status: Former Smoker    Packs/day: 1.00    Years: 10.00    Pack years: 10.00    Types: Cigarettes    Last attempt to quit: 09/25/1981    Years since quitting: 36.0  . Smokeless tobacco: Never Used  Substance and Sexual Activity  . Alcohol use: Yes    Alcohol/week: 0.6 oz    Types: 1 Cans of beer per week    Comment: 1-2 per wk  . Drug use: No  . Sexual activity: Yes    Partners: Female  Other  Topics Concern  . Not on file  Social History Narrative   Some exercise. Caffeine daily    Family History  Problem Relation Age of Onset  . Lung cancer Father   . Kidney disease Mother   . Colon cancer Neg Hx   . Esophageal cancer Neg Hx   . Rectal cancer Neg Hx   . Stomach cancer Neg Hx     Outpatient Encounter Medications as of 10/05/2017  Medication Sig  . atorvastatin (LIPITOR) 40 MG tablet Take 1 tablet (40 mg total) by mouth daily.  . Blood Glucose Monitoring Suppl (ONE TOUCH ULTRA SYSTEM KIT) w/Device KIT For testing blood sugars daily Dx: E11.9  . busPIRone (BUSPAR) 10 MG tablet Take 1 tablet (10 mg total) by mouth daily.  [name] gabapentin (NEURONTIN) 600 MG tablet Take 600 tablets by mouth 2 (two) times daily.   [name] glucose blood test strip For testing BS once daily. DX: E11.9  . LUMIGAN 0.01 % SOLN   . metFORMIN (GLUCOPHAGE-XR) 500 MG 24 hr tablet Take 1 tablet (500 mg total) by mouth daily with breakfast.  . Omega-3 Fatty Acids (FISH OIL PO) Take by  mouth daily.  Glory Rosebush DELICA LANCETS FINE MISC For testing blood sugars once daily Dx:E11.9  . oseltamivir (TAMIFLU) 75 MG capsule Take 1 capsule (75 mg total) by mouth 2 (two) times daily.  . sertraline (ZOLOFT) 50 MG tablet Take 1.5 tablets (75 mg total) by mouth daily.  . traZODone (DESYREL) 50 MG tablet Take 1 tablet (50 mg total) by mouth at bedtime.  . triamcinolone cream (KENALOG) 0.1 % Apply 1 application topically daily as needed.  . VOLTAREN 1 % GEL   . [DISCONTINUED] glucose blood test strip For testing BS once daily. DX: E11.9  . [DISCONTINUED] ONETOUCH DELICA LANCETS FINE MISC For testing blood sugars once daily Dx:E11.9   No facility-administered encounter medications on file as of 10/05/2017.           Objective:   Physical Exam  Constitutional: He is oriented to person, place, and time. He appears well-developed and well-nourished.  HENT:  Head: Normocephalic and atraumatic.  Right Ear: External ear normal.  Left Ear: External ear normal.  Nose: Nose normal.  Mouth/Throat: Oropharynx is clear and moist.  TMs and canals are clear.   Eyes: Conjunctivae and EOM are normal. Pupils are equal, round, and reactive to light.  Neck: Neck supple. No thyromegaly present.  Cardiovascular: Normal rate and normal heart sounds.  Pulmonary/Chest: Effort normal and breath sounds normal.  Lymphadenopathy:    He has no cervical adenopathy.  Neurological: He is alert and oriented to person, place, and time.  Skin: Skin is warm and dry.  Psychiatric: He has a normal mood and affect.      Assessment & Plan:  Influenza A-we will treat with Tamiflu.  Okay to continue symptomatic care with over-the-counter medications.  Make sure hydrating well.  Call if not improving over the next 3-5 days.  Diabetes -well controlled of metformin..  Continue current regimen healthy diet and regular exercise.  Heme globin A1c of 5.6 today which is absolutely fantastic.  Follow-up in 3-4 months. Lab  Results  Component Value Date   HGBA1C 5.6 10/05/2017   Deferred tetanus today since he is not feeling well.

## 2017-10-08 ENCOUNTER — Encounter: Payer: Self-pay | Admitting: Family Medicine

## 2017-10-08 DIAGNOSIS — M25561 Pain in right knee: Secondary | ICD-10-CM | POA: Diagnosis not present

## 2017-10-10 ENCOUNTER — Other Ambulatory Visit: Payer: Self-pay | Admitting: *Deleted

## 2017-10-10 DIAGNOSIS — E119 Type 2 diabetes mellitus without complications: Secondary | ICD-10-CM

## 2017-10-10 MED ORDER — GLUCOSE BLOOD VI STRP
ORAL_STRIP | 12 refills | Status: DC
Start: 2017-10-10 — End: 2017-10-10

## 2017-10-10 MED ORDER — GLUCOSE BLOOD VI STRP: ORAL_STRIP | 12 refills | Status: DC

## 2017-10-11 ENCOUNTER — Ambulatory Visit: Payer: Self-pay | Admitting: Family Medicine

## 2017-10-12 ENCOUNTER — Ambulatory Visit (INDEPENDENT_AMBULATORY_CARE_PROVIDER_SITE_OTHER): Payer: Medicare Other | Admitting: Psychiatry

## 2017-10-12 ENCOUNTER — Other Ambulatory Visit: Payer: Self-pay

## 2017-10-12 ENCOUNTER — Encounter (HOSPITAL_COMMUNITY): Payer: Self-pay | Admitting: Psychiatry

## 2017-10-12 VITALS — BP 132/80 | HR 62 | Ht 65.0 in | Wt 166.0 lb

## 2017-10-12 DIAGNOSIS — F331 Major depressive disorder, recurrent, moderate: Secondary | ICD-10-CM

## 2017-10-12 DIAGNOSIS — F5102 Adjustment insomnia: Secondary | ICD-10-CM

## 2017-10-12 DIAGNOSIS — F411 Generalized anxiety disorder: Secondary | ICD-10-CM | POA: Diagnosis not present

## 2017-10-12 DIAGNOSIS — G4700 Insomnia, unspecified: Secondary | ICD-10-CM | POA: Diagnosis not present

## 2017-10-12 MED ORDER — BUSPIRONE HCL 10 MG PO TABS: 10.0000 mg | ORAL_TABLET | Freq: Every day | ORAL | 1 refills | Status: DC

## 2017-10-12 MED ORDER — TRAZODONE HCL 50 MG PO TABS: 50.0000 mg | ORAL_TABLET | Freq: Every day | ORAL | 0 refills | Status: DC

## 2017-10-12 MED ORDER — SERTRALINE HCL 50 MG PO TABS: 75.0000 mg | ORAL_TABLET | Freq: Every day | ORAL | 3 refills | Status: DC

## 2017-10-12 NOTE — Progress Notes (Signed)
Patient ID: Nathan [name], male   DOB: 1951-02-25, 67 y.o.   MRN: [id number]   [place] Follow-up Outpatient Visit  Nathan [name] 1950-12-22  Date: 10/12/2017    History of Chief Complaint:   HPI Comments: Nathan [name] is a 67 y/o male with a past psychiatric history significant for Major Depressive Disorder. GAD, insomnia.  The patient returns for psychiatric services for medication management.   Doing fair managing anxiety he has had the flu last month but they has recovered. Sleep is reasonable takes trazodone at times BuSpar for the anxiety modifying factor is family admitting factor is wise daughter but she is pregnant    finances . Duration:more then 10 years Has lost weight working on diet      Review of Systems  Cardiovascular: Negative for chest pain.  Skin: Negative for rash.  Neurological: Negative for tingling, tremors and headaches.  Psychiatric/Behavioral: Negative for depression.      Vitals:   10/12/17 1044  BP: 132/80  Pulse: 62  Weight: 166 lb (75.3 kg)  Height: '5\' 5"'  (1.651 m)   Physical Exam  Vitals reviewed.  Constitutional: He appears well-developed and well-nourished. No distress.  Skin: He is not diaphoretic.       Past Medical History: Reviewed  Past Medical History:  Diagnosis Date  . Adenomatous colon polyp   . Allergy   . Anxiety   . CRPS (complex regional pain syndrome), upper limb   . Depression   . Diverticulitis 2013  . Diverticulosis   . Diverticulosis   . GERD (gastroesophageal reflux disease)   . Glaucoma   . History of Helicobacter pylori infection   . Hyperlipidemia   . Hypertension    no per pt  . Intestinal metaplasia of gastric mucosa   . Ligament tear of upper extremity April 2008   Left elbow  . Nephrolithiasis   . Renal calculi   . Renal disorder       Allergies: Reviewed  Allergies  Allergen Reactions  . Adhesive [Tape]     Hives on area wear tape applied  .  Hydrocodone-Acetaminophen Itching  . Hydrocodone Rash   Current Medications: Reviewed  Current Outpatient Medications on File Prior to Visit  Medication Sig Dispense Refill  . atorvastatin (LIPITOR) 40 MG tablet Take 1 tablet (40 mg total) by mouth daily. 90 tablet 3  . Blood Glucose Monitoring Suppl (ONE TOUCH ULTRA SYSTEM KIT) w/Device KIT For testing blood sugars daily Dx: E11.9 1 each 0  . gabapentin (NEURONTIN) 600 MG tablet Take 600 tablets by mouth 2 (two) times daily.     [name] glucose blood test strip For testing BS once daily. DX: E11.9 Onetouch Ultra Blue test strip 100 each 12  . LUMIGAN 0.01 % SOLN     . metFORMIN (GLUCOPHAGE-XR) 500 MG 24 hr tablet Take 1 tablet (500 mg total) by mouth daily with breakfast. 90 tablet 2  . Omega-3 Fatty Acids (FISH OIL PO) Take by mouth daily.    Glory Rosebush DELICA LANCETS FINE MISC For testing blood sugars once daily Dx:E11.9 100 each 12  . VOLTAREN 1 % GEL   5  . oseltamivir (TAMIFLU) 75 MG capsule Take 1 capsule (75 mg total) by mouth 2 (two) times daily. (Patient not taking: Reported on 10/12/2017) 10 capsule 0  . triamcinolone cream (KENALOG) 0.1 % Apply 1 application topically daily as needed. (Patient not taking: Reported on 10/12/2017) 30 g 0   No current facility-administered medications on file prior  to visit.       Family History: Reviewed  Family History  Problem Relation Age of Onset  . Lung cancer Father   . Kidney disease Mother   . Colon cancer Neg Hx   . Esophageal cancer Neg Hx   . Rectal cancer Neg Hx   . Stomach cancer Neg Hx     Psychiatric specialty examination:  Objective: Appearance: Casual and Well Groomed   Eye Contact:: Good   Speech: Clear and Coherent and Normal Rate   Volume: Normal   Mood: fair  Affect: pleasant  Thought Process: Coherent, Logical and Loose   Orientation: Full   Thought Content: WDL   Suicidal Thoughts: No   Homicidal Thoughts: No   Judgement: Fair   Insight: Fair   Psychomotor  Activity: Normal   Akathisia: No   Handed: Right   Memory: Immediate 3/3, recent 3/3   Concentration: Good   AIMS (if indicated): Not Indicated   Assets: Surveyor, mining  Housing  Social Support    Assessment:  AXIS I  Major Depressive Disorder, recurrent, moderate. GAD. Insomnia   Treatment Plan/Recommendations:  Plan of Care:  PLAN:  1. Affirm with the patient that the medications are taken as ordered. Patient  expressed understanding of how their medications were to be used.    Laboratory:  No labs warranted at this time.    Psychotherapy: Therapy: brief supportive therapy provided.  Discussed psychosocial stressors in detail and spent more then 50% of time  Medications:  Anxiety: ;manageable. contineu zoloft and buspar Major depression: doing fair. Continue zoloft Insomnia: stable on trazadone. Takes prn Refills sent. FU 4 months             Honestee Revard, MD  10/12/2017 10:54 AM

## 2017-10-16 ENCOUNTER — Other Ambulatory Visit (HOSPITAL_COMMUNITY): Payer: Self-pay | Admitting: Psychiatry

## 2018-02-01 ENCOUNTER — Encounter (HOSPITAL_COMMUNITY): Payer: Self-pay | Admitting: Psychiatry

## 2018-02-01 ENCOUNTER — Ambulatory Visit (INDEPENDENT_AMBULATORY_CARE_PROVIDER_SITE_OTHER): Payer: Medicare Other | Admitting: Psychiatry

## 2018-02-01 DIAGNOSIS — F331 Major depressive disorder, recurrent, moderate: Secondary | ICD-10-CM

## 2018-02-01 DIAGNOSIS — F5102 Adjustment insomnia: Secondary | ICD-10-CM

## 2018-02-01 DIAGNOSIS — F411 Generalized anxiety disorder: Secondary | ICD-10-CM

## 2018-02-01 MED ORDER — BUSPIRONE HCL 10 MG PO TABS: 10.0000 mg | ORAL_TABLET | Freq: Every day | ORAL | 1 refills | Status: DC

## 2018-02-01 MED ORDER — SERTRALINE HCL 50 MG PO TABS: 75.0000 mg | ORAL_TABLET | Freq: Every day | ORAL | 3 refills | Status: DC

## 2018-02-01 MED ORDER — TRAZODONE HCL 50 MG PO TABS: 50.0000 mg | ORAL_TABLET | Freq: Every day | ORAL | 1 refills | Status: DC

## 2018-02-01 NOTE — Progress Notes (Signed)
Patient ID: Nathan [name], male   DOB: 1951/05/11, 67 y.o.   MRN: [id number]   [place] Follow-up Outpatient Visit  Nathan [name] [birth year]  Date: 10/12/2017    History of Chief Complaint:   HPI Comments: Nathan [name] is a 67 y/o male with a past psychiatric history significant for Major Depressive Disorder. GAD, insomnia.  The patient returns for psychiatric services for medication management.   Doing fair on zoloft. Not worse depression Wife and her daughters stress due to their co dependency and she is living with them. Had a baby but she was using drugs while pregnanct so dSS got involved    finances . Duration:10 years  sleeps fair on prn trazadone      Review of Systems  Cardiovascular: Negative for palpitations.  Skin: Negative for rash.  Neurological: Negative for tingling, tremors and headaches.  Psychiatric/Behavioral: Negative for depression.      There were no vitals filed for this visit. Physical Exam  Vitals reviewed.  Constitutional: He appears well-developed and well-nourished. No distress.  Skin: He is not diaphoretic.       Past Medical History: Reviewed  Past Medical History:  Diagnosis Date  . Adenomatous colon polyp   . Allergy   . Anxiety   . CRPS (complex regional pain syndrome), upper limb   . Depression   . Diverticulitis 2013  . Diverticulosis   . Diverticulosis   . GERD (gastroesophageal reflux disease)   . Glaucoma   . History of Helicobacter pylori infection   . Hyperlipidemia   . Hypertension    no per pt  . Intestinal metaplasia of gastric mucosa   . Ligament tear of upper extremity April 2008   Left elbow  . Nephrolithiasis   . Renal calculi   . Renal disorder       Allergies: Reviewed  Allergies  Allergen Reactions  . Adhesive [Tape]     Hives on area wear tape applied  . Hydrocodone-Acetaminophen Itching  . Hydrocodone Rash   Current Medications: Reviewed  Current Outpatient  Medications on File Prior to Visit  Medication Sig Dispense Refill  . atorvastatin (LIPITOR) 40 MG tablet Take 1 tablet (40 mg total) by mouth daily. 90 tablet 3  . Blood Glucose Monitoring Suppl (ONE TOUCH ULTRA SYSTEM KIT) w/Device KIT For testing blood sugars daily Dx: E11.9 1 each 0  . gabapentin (NEURONTIN) 600 MG tablet Take 600 tablets by mouth 2 (two) times daily.     [name] glucose blood test strip For testing BS once daily. DX: E11.9 Onetouch Ultra Blue test strip 100 each 12  . LUMIGAN 0.01 % SOLN     . metFORMIN (GLUCOPHAGE-XR) 500 MG 24 hr tablet Take 1 tablet (500 mg total) by mouth daily with breakfast. 90 tablet 2  . Omega-3 Fatty Acids (FISH OIL PO) Take by mouth daily.    Glory Rosebush DELICA LANCETS FINE MISC For testing blood sugars once daily Dx:E11.9 100 each 12  . oseltamivir (TAMIFLU) 75 MG capsule Take 1 capsule (75 mg total) by mouth 2 (two) times daily. (Patient not taking: Reported on 10/12/2017) 10 capsule 0  . traZODone (DESYREL) 50 MG tablet TAKE 1 TABLET BY MOUTH AT BEDTIME 30 tablet 1  . triamcinolone cream (KENALOG) 0.1 % Apply 1 application topically daily as needed. (Patient not taking: Reported on 10/12/2017) 30 g 0  . VOLTAREN 1 % GEL   5   No current facility-administered medications on file prior to visit.  Family History: Reviewed  Family History  Problem Relation Age of Onset  . Lung cancer Father   . Kidney disease Mother   . Colon cancer Neg Hx   . Esophageal cancer Neg Hx   . Rectal cancer Neg Hx   . Stomach cancer Neg Hx     Psychiatric specialty examination:  Objective: Appearance: Casual and Well Groomed   Eye Contact:: Good   Speech: Clear and Coherent and Normal Rate   Volume: Normal   Mood: fair  Affect: pleasant  Thought Process: Coherent, Logical and Loose   Orientation: Full   Thought Content: WDL   Suicidal Thoughts: No   Homicidal Thoughts: No   Judgement: Fair   Insight: Fair   Psychomotor Activity: Normal   Akathisia:  No   Handed: Right   Memory: Immediate 3/3, recent 3/3   Concentration: Good   AIMS (if indicated): Not Indicated   Assets: Surveyor, mining  Housing  Social Support    Assessment:  AXIS I  Major Depressive Disorder, recurrent, moderate. GAD. Insomnia   Treatment Plan/Recommendations:  Plan of Care:  PLAN:  1. Affirm with the patient that the medications are taken as ordered. Patient  expressed understanding of how their medications were to be used.    Laboratory:  No labs warranted at this time.    Psychotherapy: Therapy: brief supportive therapy provided.  Discussed psychosocial stressors in detail and spent more then 50% of time  Medications:  Anxiety: ;manageable. contineu zoloft and buspar Major depression: fair on zoloft. Continue Insomnia: stable on trazadone Refills sent. FU 4 months             Sylvia Kondracki, MD  02/01/2018 10:30 AM

## 2018-03-05 DIAGNOSIS — H401132 Primary open-angle glaucoma, bilateral, moderate stage: Secondary | ICD-10-CM | POA: Diagnosis not present

## 2018-03-05 DIAGNOSIS — H2513 Age-related nuclear cataract, bilateral: Secondary | ICD-10-CM | POA: Diagnosis not present

## 2018-03-05 DIAGNOSIS — H43393 Other vitreous opacities, bilateral: Secondary | ICD-10-CM | POA: Diagnosis not present

## 2018-05-23 ENCOUNTER — Other Ambulatory Visit: Payer: Self-pay | Admitting: Family Medicine

## 2018-05-31 ENCOUNTER — Ambulatory Visit (INDEPENDENT_AMBULATORY_CARE_PROVIDER_SITE_OTHER): Payer: Medicare Other | Admitting: Psychiatry

## 2018-05-31 ENCOUNTER — Encounter (HOSPITAL_COMMUNITY): Payer: Self-pay | Admitting: Psychiatry

## 2018-05-31 VITALS — BP 118/76 | HR 63 | Ht 65.0 in | Wt 165.0 lb

## 2018-05-31 DIAGNOSIS — F5102 Adjustment insomnia: Secondary | ICD-10-CM | POA: Diagnosis not present

## 2018-05-31 DIAGNOSIS — F411 Generalized anxiety disorder: Secondary | ICD-10-CM | POA: Diagnosis not present

## 2018-05-31 DIAGNOSIS — F331 Major depressive disorder, recurrent, moderate: Secondary | ICD-10-CM

## 2018-05-31 MED ORDER — TRAZODONE HCL 50 MG PO TABS: 50.0000 mg | ORAL_TABLET | Freq: Every day | ORAL | 1 refills | Status: DC

## 2018-05-31 MED ORDER — SERTRALINE HCL 100 MG PO TABS: 100.0000 mg | ORAL_TABLET | Freq: Every day | ORAL | 2 refills | Status: DC

## 2018-05-31 NOTE — Progress Notes (Signed)
Patient ID: Nathan [name], male   DOB: Feb 13, 1951, 67 y.o.   MRN: [id number]   [place] Follow-up Outpatient Visit  Nathan [name] 06-22-1951  Date: 05/31/2018    History of Chief Complaint:   HPI Comments: Nathan [name] is a 67 y/o male with a past psychiatric history significant for Major Depressive Disorder. GAD, insomnia.  The patient returns for psychiatric services for medication management.   Has been doing fair. 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 Duration:10 years  sleeps fair on prn trazadone      Review of Systems  Cardiovascular: Negative for palpitations.  Skin: Negative for rash.  Neurological: Negative for tingling, tremors and headaches.  Psychiatric/Behavioral: Negative for depression. The patient is nervous/anxious.       Vitals:   05/31/18 1211  BP: 118/76  Pulse: 63  SpO2: 94%  Weight: 165 lb (74.8 kg)  Height: '5\' 5"'  (1.651 m)   Physical Exam  Vitals reviewed.  Constitutional: He appears well-developed and well-nourished. No distress.  Skin: He is not diaphoretic.       Past Medical History: Reviewed  Past Medical History:  Diagnosis Date  . Adenomatous colon polyp   . Allergy   . Anxiety   . CRPS (complex regional pain syndrome), upper limb   . Depression   . Diverticulitis 2013  . Diverticulosis   . Diverticulosis   . GERD (gastroesophageal reflux disease)   . Glaucoma   . History of Helicobacter pylori infection   . Hyperlipidemia   . Hypertension    no per pt  . Intestinal metaplasia of gastric mucosa   . Ligament tear of upper extremity April 2008   Left elbow  . Nephrolithiasis   . Renal calculi   . Renal  disorder       Allergies: Reviewed  Allergies  Allergen Reactions  . Adhesive [Tape]     Hives on area wear tape applied  . Hydrocodone-Acetaminophen Itching  . Hydrocodone Rash   Current Medications: Reviewed  Current Outpatient Medications on File Prior to Visit  Medication Sig Dispense Refill  . atorvastatin (LIPITOR) 40 MG tablet Take 1 tablet (40 mg total) by mouth daily. 90 tablet 3  . Blood Glucose Monitoring Suppl (ONE TOUCH ULTRA SYSTEM KIT) w/Device KIT For testing blood sugars daily Dx: E11.9 1 each 0  . busPIRone (BUSPAR) 10 MG tablet Take 1 tablet (10 mg total) by mouth daily. 30 tablet 1  . gabapentin (NEURONTIN) 600 MG tablet Take 600 tablets by mouth 2 (two) times daily.     [name] glucose blood test strip For testing BS once daily. DX: E11.9 Onetouch Ultra Blue test strip 100 each 12  . LUMIGAN 0.01 % SOLN     . metFORMIN (GLUCOPHAGE-XR) 500 MG 24 hr tablet Take 1 tablet (500 mg total) by mouth daily with breakfast. 30 DAY SUPPLY GIVEN. MUST SCHEDULE AND KEEP APPOINTMENT FOR REFILLS 30 tablet 0  . Omega-3 Fatty Acids (FISH OIL PO) Take by mouth daily.    Glory Rosebush DELICA LANCETS FINE MISC For testing blood sugars once daily Dx:E11.9 100 each 12  . oseltamivir (TAMIFLU) 75 MG capsule Take 1 capsule (75  mg total) by mouth 2 (two) times daily. 10 capsule 0  . traZODone (DESYREL) 50 MG tablet Take 1 tablet (50 mg total) by mouth at bedtime. 30 tablet 1  . triamcinolone cream (KENALOG) 0.1 % Apply 1 application topically daily as needed. 30 g 0  . VOLTAREN 1 % GEL   5   No current facility-administered medications on file prior to visit.       Family History: Reviewed  Family History  Problem Relation Age of Onset  . Lung cancer Father   . Kidney disease Mother   . Colon cancer Neg Hx   . Esophageal cancer Neg Hx   . Rectal cancer Neg Hx   . Stomach cancer Neg Hx     Psychiatric specialty examination:  Objective: Appearance: Casual and Well Groomed   Eye  Contact:: Good   Speech: Clear and Coherent and Normal Rate   Volume: Normal   Mood: somewhat stressed  Affect: pleasant  Thought Process: Coherent, Logical and Loose   Orientation: Full   Thought Content: WDL   Suicidal Thoughts: No   Homicidal Thoughts: No   Judgement: Fair   Insight: Fair   Psychomotor Activity: Normal   Akathisia: No   Handed: Right   Memory: Immediate 3/3, recent 3/3   Concentration: Good   AIMS (if indicated): Not Indicated   Assets: Surveyor, mining  Housing  Social Support    Assessment:  AXIS I  Major Depressive Disorder, recurrent, moderate. GAD. Insomnia   Treatment Plan/Recommendations:  Plan of Care:  PLAN:  1. Affirm with the patient that the medications are taken as ordered. Patient  expressed understanding of how their medications were to be used.    Laboratory:  No labs warranted at this time.    Psychotherapy: Therapy: brief supportive therapy provided.  Discussed psychosocial stressors in detail and spent more then 50% of time  Medications:  Anxiety: somewhat more anxious. Increase zoloft to 158m, continue buspar prn Call for refills on buspar Major depression: fair on zoloft. Continue Insomnia: stable on trazadone Refills sent. FU 4 months             Jeffifer Rabold, MD  05/31/2018 12:20 PM

## 2018-06-19 ENCOUNTER — Telehealth: Payer: Self-pay | Admitting: Family Medicine

## 2018-06-19 DIAGNOSIS — Z125 Encounter for screening for malignant neoplasm of prostate: Secondary | ICD-10-CM

## 2018-06-19 DIAGNOSIS — Z0000 Encounter for general adult medical examination without abnormal findings: Secondary | ICD-10-CM

## 2018-06-19 DIAGNOSIS — I10 Essential (primary) hypertension: Secondary | ICD-10-CM

## 2018-06-19 DIAGNOSIS — E119 Type 2 diabetes mellitus without complications: Secondary | ICD-10-CM

## 2018-06-19 DIAGNOSIS — E785 Hyperlipidemia, unspecified: Secondary | ICD-10-CM

## 2018-06-19 NOTE — Telephone Encounter (Addendum)
Dr Madilyn Fireman, I've scheduled Mr Crevier in a 20 min slot for a physical and not 40 mins since Maudie Mercury is now doing the [employer] piece, is this okay? He's also wanting to get lab order for Oct 1st thanks!

## 2018-06-20 NOTE — Telephone Encounter (Signed)
Labs ordered and pended. Routed to pcp.Nathan [name], [place]

## 2018-06-20 NOTE — Telephone Encounter (Signed)
Will this be a follow-up visit?  He does need a diabetes follow-up he has not been seen since January

## 2018-06-20 NOTE — Telephone Encounter (Signed)
Left pt. vm

## 2018-06-21 ENCOUNTER — Telehealth: Payer: Self-pay | Admitting: Family Medicine

## 2018-06-21 NOTE — Telephone Encounter (Signed)
Lab printed.  He can go to lab anytime.

## 2018-06-21 NOTE — Telephone Encounter (Signed)
error 

## 2018-06-24 NOTE — Telephone Encounter (Signed)
Thanks! Left pt vm

## 2018-06-25 DIAGNOSIS — Z125 Encounter for screening for malignant neoplasm of prostate: Secondary | ICD-10-CM | POA: Diagnosis not present

## 2018-06-25 DIAGNOSIS — I10 Essential (primary) hypertension: Secondary | ICD-10-CM | POA: Diagnosis not present

## 2018-06-25 DIAGNOSIS — E785 Hyperlipidemia, unspecified: Secondary | ICD-10-CM | POA: Diagnosis not present

## 2018-06-25 DIAGNOSIS — E119 Type 2 diabetes mellitus without complications: Secondary | ICD-10-CM | POA: Diagnosis not present

## 2018-06-26 ENCOUNTER — Other Ambulatory Visit: Payer: Self-pay | Admitting: Family Medicine

## 2018-06-26 LAB — PSA: PSA: 2.3 ng/mL (ref <=4.0)

## 2018-06-26 LAB — MICROALBUMIN, URINE: Microalb, Ur: 3.1 mg/dL

## 2018-06-26 LAB — COMPLETE METABOLIC PANEL WITH GFR
AG Ratio: 1.6 (calc) (ref 1.0–2.5)
ALT: 18 U/L (ref 9–46)
AST: 25 U/L (ref 10–35)
Albumin: 4.6 g/dL (ref 3.6–5.1)
Alkaline phosphatase (APISO): 93 U/L (ref 40–115)
BUN: 19 mg/dL (ref 7–25)
CO2: 26 mmol/L (ref 20–32)
Calcium: 9.5 mg/dL (ref 8.6–10.3)
Chloride: 102 mmol/L (ref 98–110)
Creat: 0.94 mg/dL (ref 0.70–1.25)
GFR, Est African American: 98 mL/min/{1.73_m2} (ref >=60)
GFR, Est Non African American: 84 mL/min/{1.73_m2} (ref >=60)
Globulin: 2.8 g/dL (calc) (ref 1.9–3.7)
Glucose, Bld: 112 mg/dL — ABNORMAL HIGH (ref 65–99)
Potassium: 4.1 mmol/L (ref 3.5–5.3)
Sodium: 139 mmol/L (ref 135–146)
Total Bilirubin: 1.4 mg/dL — ABNORMAL HIGH (ref 0.2–1.2)
Total Protein: 7.4 g/dL (ref 6.1–8.1)

## 2018-06-26 LAB — LIPID PANEL
Cholesterol: 178 mg/dL (ref <200)
HDL: 46 mg/dL (ref >40)
LDL Cholesterol (Calc): 106 mg/dL (calc) — ABNORMAL HIGH
Non-HDL Cholesterol (Calc): 132 mg/dL (calc) — ABNORMAL HIGH (ref <130)
Total CHOL/HDL Ratio: 3.9 (calc) (ref <5.0)
Triglycerides: 150 mg/dL — ABNORMAL HIGH (ref <150)

## 2018-06-26 LAB — HEMOGLOBIN A1C
Hgb A1c MFr Bld: 5.6 % of total Hgb (ref <5.7)
Mean Plasma Glucose: 114 (calc)
eAG (mmol/L): 6.3 (calc)

## 2018-06-26 MED ORDER — ATORVASTATIN CALCIUM 40 MG PO TABS: 40.0000 mg | ORAL_TABLET | Freq: Every day | ORAL | 3 refills | Status: DC

## 2018-06-28 ENCOUNTER — Ambulatory Visit (INDEPENDENT_AMBULATORY_CARE_PROVIDER_SITE_OTHER): Payer: Medicare Other | Admitting: Family Medicine

## 2018-06-28 ENCOUNTER — Encounter: Payer: Self-pay | Admitting: Family Medicine

## 2018-06-28 VITALS — BP 126/65 | HR 75 | Ht 65.0 in | Wt 164.0 lb

## 2018-06-28 DIAGNOSIS — E119 Type 2 diabetes mellitus without complications: Secondary | ICD-10-CM | POA: Diagnosis not present

## 2018-06-28 DIAGNOSIS — E785 Hyperlipidemia, unspecified: Secondary | ICD-10-CM

## 2018-06-28 DIAGNOSIS — I10 Essential (primary) hypertension: Secondary | ICD-10-CM | POA: Diagnosis not present

## 2018-06-28 MED ORDER — METFORMIN HCL ER 500 MG PO TB24: 500.0000 mg | ORAL_TABLET | Freq: Every day | ORAL | 1 refills | Status: DC

## 2018-06-28 NOTE — Patient Instructions (Signed)
OK to cut the metformin in half and take a half a tab daily. We can check your A1C in 90 days.

## 2018-06-28 NOTE — Progress Notes (Signed)
Subjective:    CC: DM, HTN  HPI:  Diabetes - no hypoglycemic events. No wounds or sores that are not healing well. No increased thirst or urination. Checking glucose at home. Taking medications as prescribed without any side effects.  Hypertension- Pt denies chest pain, SOB, dizziness, or heart palpitations.  Taking meds as directed w/o problems.  Denies medication side effects.    Hyperlipidemia-recent lipid panel showed that his LDL was a little over 100.  He is currently taking Lipitor.  Tolerating medication well without any side effects or problems.  Just refilled his medication 2 days ago.  Past medical history, Surgical history, Family history not pertinant except as noted below, Social history, Allergies, and medications have been entered into the medical record, reviewed, and corrections made.   Review of Systems: No fevers, chills, night sweats, weight loss, chest pain, or shortness of breath.   Objective:    General: Well Developed, well nourished, and in no acute distress.  Neuro: Alert and oriented x3, extra-ocular muscles intact, sensation grossly intact.  HEENT: Normocephalic, atraumatic  Skin: Warm and dry, no rashes. Cardiac: Regular rate and rhythm, no murmurs rubs or gallops, no lower extremity edema.  Respiratory: Clear to auscultation bilaterally. Not using accessory muscles, speaking in full sentences.   Impression and Recommendations:    DM -hemoglobin A1c looks absolutely fantastic today at 5.6.  We discussed options.  He really has a goal of getting off of medications were can actually have him cut his metformin a half and take a half a tab daily and then recheck his A1c in 90 days if it still looks great at that point and we may actually be able to discontinue it and see what his blood glucose looks without it.  In the meantime continue to work on healthy diet and regular exercise.  Hyperipidemia-urged him to continue taking his statin and to be consistent with  it.  He really does not have any wiggle room to decrease his dose.  Hypertension- Well controlled. Continue current regimen. Follow up in  6 moths.

## 2018-07-16 NOTE — Progress Notes (Signed)
Subjective:   Nathan [name] is a 67 y.o. male who presents for Medicare Annual/Subsequent preventive examination.  Review of Systems:  No ROS.  Medicare Wellness Visit. Additional risk factors are reflected in the social history.  Cardiac Risk Factors include: advanced age (>51mn, >>9women);dyslipidemia;diabetes mellitus;hypertension;male gender Sleep patterns: Getting 6-7 hours of sleep at night. Takes 1 nap during day. Upon wakening feels rested. Wakes up 1 times during night to go to the bathroom.   Home Safety/Smoke Alarms: Feels safe in home. Smoke alarms in place.  Living environment; Lives with wife in a 2 story home. Handrails in place on stairs. Shower is a walk in shower no grab bars in place but does have a built in bench.   Male:   CCS-  utd   PSA-  utd Lab Results  Component Value Date   PSA 2.3 06/25/2018   PSA 2.2 06/11/2017   PSA 1.5 06/14/2016       Objective:    Vitals: BP 116/64   Pulse 64   Ht 5' 5" (1.651 m)   Wt 166 lb (75.3 kg)   SpO2 98%   BMI 27.62 kg/m   Body mass index is 27.62 kg/m.  Advanced Directives 07/24/2018 01/18/2015 05/27/2014  Does Patient Have a Medical Advance Directive? No No No  Would patient like information on creating a medical advance directive? No - Patient declined - No - patient declined information    Tobacco Social History   Tobacco Use  Smoking Status Former Smoker  . Packs/day: 1.00  . Years: 10.00  . Pack years: 10.00  . Types: Cigarettes  . Last attempt to quit: 09/25/1981  . Years since quitting: 36.8  Smokeless Tobacco Never Used     Counseling given: Not Answered   Clinical Intake:  Pre-visit preparation completed: Yes  Pain : No/denies pain     Nutritional Risks: None Diabetes: Yes CBG done?: No Did pt. bring in CBG monitor from home?: No  Pt testing sugars at home 3 times a week. Sugar levels running between 90-102.  How often do you need to have someone help you when you read  instructions, pamphlets, or other written materials from your doctor or pharmacy?: 1 - Never What is the last grade level you completed in school?: 16  Interpreter Needed?: No  Information entered by :: KOrlie Dakin LPN  Past Medical History:  Diagnosis Date  . Adenomatous colon polyp   . Allergy   . Anxiety   . CRPS (complex regional pain syndrome), upper limb   . Depression   . Diabetes mellitus without complication ([place]   . Diverticulitis 2013  . Diverticulosis   . Diverticulosis   . GERD (gastroesophageal reflux disease)   . Glaucoma   . History of Helicobacter pylori infection   . Hyperlipidemia   . Hypertension    no per pt  . Intestinal metaplasia of gastric mucosa   . Ligament tear of upper extremity April 2008   Left elbow  . Nephrolithiasis   . Renal calculi   . Renal disorder    Past Surgical History:  Procedure Laterality Date  . LITHOTRIPSY  2004   kidney stones  . REPLACEMENT TOTAL KNEE Right 2014  . ROTATOR CUFF REPAIR Left 2011  . ULNAR NERVE REPAIR Left 2009, 2010,   3 surgeries   Family History  Problem Relation Age of Onset  . Lung cancer Father   . Kidney disease Mother   . Colon cancer Neg Hx   .  Esophageal cancer Neg Hx   . Rectal cancer Neg Hx   . Stomach cancer Neg Hx    Social History   Socioeconomic History  . Marital status: Married    Spouse name: Not on file  . Number of children: 1  . Years of education: 73  . Highest education level: Bachelor's degree (e.g., BA, AB, BS)  Occupational History  . Occupation: AIRCRAFT INT [employer]    Employer: [employer]: Retired.   Social Needs  . Financial resource strain: Not hard at all  . Food insecurity:    Worry: Never true    Inability: Never true  . Transportation needs:    Medical: No    Non-medical: No  Tobacco Use  . Smoking status: Former Smoker    Packs/day: 1.00    Years: 10.00    Pack years: 10.00    Types: Cigarettes    Last attempt to quit: 09/25/1981    Years  since quitting: 36.8  . Smokeless tobacco: Never Used  Substance and Sexual Activity  . Alcohol use: Yes    Alcohol/week: 1.0 standard drinks    Types: 1 Cans of beer per week    Comment: 1-2 per wk  . Drug use: No  . Sexual activity: Yes    Partners: Female  Lifestyle  . Physical activity:    Days per week: 0 days    Minutes per session: 0 min  . Stress: Not at all  Relationships  . Social connections:    Talks on phone: More than three times a week    Gets together: Once a week    Attends religious service: More than 4 times per year    Active member of club or organization: No    Attends meetings of clubs or organizations: Never    Relationship status: Married  Other Topics Concern  . Not on file  Social History Narrative   Some exercise. Caffeine daily. Plays chess to keep brain stimulated    Outpatient Encounter Medications as of 07/24/2018  Medication Sig  . atorvastatin (LIPITOR) 40 MG tablet Take 1 tablet (40 mg total) by mouth daily.  . Blood Glucose Monitoring Suppl (ONE TOUCH ULTRA SYSTEM KIT) w/Device KIT For testing blood sugars daily Dx: E11.9  . busPIRone (BUSPAR) 10 MG tablet Take 1 tablet (10 mg total) by mouth daily.  [name] gabapentin (NEURONTIN) 600 MG tablet Take 600 tablets by mouth 2 (two) times daily.   [name] glucose blood test strip For testing BS once daily. DX: E11.9 Onetouch Ultra Blue test strip  . LUMIGAN 0.01 % SOLN   . metFORMIN (GLUCOPHAGE-XR) 500 MG 24 hr tablet Take 1 tablet (500 mg total) by mouth daily with breakfast.  . ONETOUCH DELICA LANCETS FINE MISC For testing blood sugars once daily Dx:E11.9  . sertraline (ZOLOFT) 100 MG tablet Take 1 tablet (100 mg total) by mouth daily.  . traZODone (DESYREL) 50 MG tablet Take 1 tablet (50 mg total) by mouth at bedtime.  . VOLTAREN 1 % GEL   . Omega-3 Fatty Acids (FISH OIL PO) Take by mouth daily.  [name] triamcinolone cream (KENALOG) 0.1 % Apply 1 application topically daily as needed. (Patient not taking:  Reported on 07/24/2018)   No facility-administered encounter medications on file as of 07/24/2018.     Activities of Daily Living In your present state of health, do you have any difficulty performing the following activities: 07/24/2018  Hearing? N  Vision? N  Difficulty concentrating  or making decisions? N  Walking or climbing stairs? N  Dressing or bathing? N  Doing errands, shopping? N  Preparing Food and eating ? N  Using the Toilet? N  In the past six months, have you accidently leaked urine? N  Do you have problems with loss of bowel control? N  Managing your Medications? N  Managing your Finances? N  Housekeeping or managing your Housekeeping? N  Some recent data might be hidden    Patient Care Team: Hali Marry, MD as PCP - General   Assessment:   This is a routine wellness examination for Brode.Physical assessment deferred to PCP.   Exercise Activities and Dietary recommendations Current Exercise Habits: Home exercise routine, Type of exercise: walking, Time (Minutes): 30, Frequency (Times/Week): 3, Weekly Exercise (Minutes/Week): 90, Intensity: Mild, Exercise limited by: cardiac condition(s) Diet Healthy diet. Doesn't eat out at fast food.  Breakfast: Cup of milk Lunch:  Eggs or leftovers Dinner:  Soups or meat and salad with vegetables. Drinks 64 ounces of water daily.     Goals    . Exercise 150 min/wk Moderate Activity     Increase exercise as tolerated       Fall Risk Fall Risk  07/24/2018 06/28/2018 06/11/2017 06/08/2016 05/27/2014  Falls in the past year? _0    Is the patient's home free of loose throw rugs in walkways, pet beds, electrical cords, etc?   yes      Grab bars in the bathroom? no      Handrails on the stairs?   yes      Adequate lighting?   yes   Depression Screen PHQ 2/9 Scores 07/24/2018 06/28/2018 06/11/2017 12/07/2016  PHQ - 2 Score 0 0 0 0  Some encounter information is confidential and restricted. Go to  Review Flowsheets activity to see all data.    Cognitive Function     6CIT Screen 07/24/2018  What Year? 0 points  What month? 0 points  What time? 0 points  Count back from 20 0 points  Months in reverse 0 points  Repeat phrase 0 points  Total Score 0    Immunization History  Administered Date(s) Administered  . Hep A / Hep B 07/30/2015, 08/09/2015, 08/30/2015  . Influenza Whole 06/25/2009  . Pneumococcal Conjugate-13 12/07/2016  . Tdap 06/17/2007    Screening Tests Health Maintenance  Topic Date Due  . TETANUS/TDAP  06/16/2017  . PNA vac Low Risk Adult (2 of 2 - PPSV23) 12/07/2017  . INFLUENZA VACCINE  12/07/2021 (Originally 04/25/2018)  . OPHTHALMOLOGY EXAM  10/01/2018  . HEMOGLOBIN A1C  12/25/2018  . URINE MICROALBUMIN  06/26/2019  . FOOT EXAM  06/29/2019  . COLONOSCOPY  02/01/2020  . Hepatitis C Screening  Completed         Plan:    Please schedule your next medicare wellness visit with me in 1 yr.  Mr. Mazon , Thank you for taking time to come for your Medicare Wellness Visit. I appreciate your ongoing commitment to your health goals. Please review the following plan we discussed and let me know if I can assist you in the future.  Continue doing brain stimulating activities (puzzles, reading, adult coloring books, staying active) to keep memory sharp.    These are the goals we discussed: Goals    . Exercise 150 min/wk Moderate Activity     Increase exercise as tolerated       This is a list of the screening recommended  for you and due dates:  Health Maintenance  Topic Date Due  . Tetanus Vaccine  06/16/2017  . Pneumonia vaccines (2 of 2 - PPSV23) 12/07/2017  . Flu Shot  12/07/2021*  . Eye exam for diabetics  10/01/2018  . Hemoglobin A1C  12/25/2018  . Urine Protein Check  06/26/2019  . Complete foot exam   06/29/2019  . Colon Cancer Screening  02/01/2020  .  Hepatitis C: One time screening is recommended by Center for Disease Control  (CDC) for   adults born from [birth year] through 1965.   Completed  *Topic was postponed. The date shown is not the original due date.     I have personally reviewed and noted the following in the patient's chart:   . Medical and social history . Use of alcohol, tobacco or illicit drugs  . Current medications and supplements . Functional ability and status . Nutritional status . Physical activity . Advanced directives . List of other physicians . Hospitalizations, surgeries, and ER visits in previous 12 months . Vitals . Screenings to include cognitive, depression, and falls . Referrals and appointments  In addition, I have reviewed and discussed with patient certain preventive protocols, quality metrics, and best practice recommendations. A written personalized care plan for preventive services as well as general preventive health recommendations were provided to patient.     Joanne Chars, LPN  [id number]

## 2018-07-24 ENCOUNTER — Ambulatory Visit (INDEPENDENT_AMBULATORY_CARE_PROVIDER_SITE_OTHER): Payer: Medicare Other | Admitting: *Deleted

## 2018-07-24 VITALS — BP 116/64 | HR 64 | Ht 65.0 in | Wt 166.0 lb

## 2018-07-24 DIAGNOSIS — Z0000 Encounter for general adult medical examination without abnormal findings: Secondary | ICD-10-CM

## 2018-07-24 DIAGNOSIS — Z23 Encounter for immunization: Secondary | ICD-10-CM | POA: Diagnosis not present

## 2018-07-24 MED ORDER — TETANUS-DIPHTH-ACELL PERTUSSIS 5-2.5-18.5 LF-MCG/0.5 IM SUSP
0.5000 mL | Freq: Once | INTRAMUSCULAR | Status: AC
  Administered 2018-07-24: 0.5 mL via INTRAMUSCULAR

## 2018-07-24 NOTE — Patient Instructions (Signed)
Please schedule your next medicare wellness visit with me in 1 yr.  Nathan [name] , Thank you for taking time to come for your Medicare Wellness Visit. I appreciate your ongoing commitment to your health goals. Please review the following plan we discussed and let me know if I can assist you in the future.  Continue doing brain stimulating activities (puzzles, reading, adult coloring books, staying active) to keep memory sharp.  These are the goals we discussed: Goals    . Exercise 150 min/wk Moderate Activity     Increase exercise as tolerated

## 2018-08-26 ENCOUNTER — Other Ambulatory Visit (HOSPITAL_COMMUNITY): Payer: Self-pay | Admitting: Psychiatry

## 2018-08-28 DIAGNOSIS — H43393 Other vitreous opacities, bilateral: Secondary | ICD-10-CM | POA: Diagnosis not present

## 2018-08-28 DIAGNOSIS — H524 Presbyopia: Secondary | ICD-10-CM | POA: Diagnosis not present

## 2018-08-28 DIAGNOSIS — H5213 Myopia, bilateral: Secondary | ICD-10-CM | POA: Diagnosis not present

## 2018-08-28 DIAGNOSIS — H2513 Age-related nuclear cataract, bilateral: Secondary | ICD-10-CM | POA: Diagnosis not present

## 2018-08-28 DIAGNOSIS — H401132 Primary open-angle glaucoma, bilateral, moderate stage: Secondary | ICD-10-CM | POA: Diagnosis not present

## 2018-08-30 ENCOUNTER — Ambulatory Visit (INDEPENDENT_AMBULATORY_CARE_PROVIDER_SITE_OTHER): Payer: Medicare Other | Admitting: Psychiatry

## 2018-08-30 ENCOUNTER — Encounter (HOSPITAL_COMMUNITY): Payer: Self-pay | Admitting: Psychiatry

## 2018-08-30 DIAGNOSIS — F331 Major depressive disorder, recurrent, moderate: Secondary | ICD-10-CM | POA: Diagnosis not present

## 2018-08-30 DIAGNOSIS — F411 Generalized anxiety disorder: Secondary | ICD-10-CM | POA: Diagnosis not present

## 2018-08-30 DIAGNOSIS — F5102 Adjustment insomnia: Secondary | ICD-10-CM

## 2018-08-30 MED ORDER — BUSPIRONE HCL 10 MG PO TABS: 10.0000 mg | ORAL_TABLET | Freq: Two times a day (BID) | ORAL | 1 refills | Status: DC

## 2018-08-30 MED ORDER — SERTRALINE HCL 100 MG PO TABS: 100.0000 mg | ORAL_TABLET | Freq: Every day | ORAL | 2 refills | Status: DC

## 2018-08-30 MED ORDER — TRAZODONE HCL 50 MG PO TABS: 50.0000 mg | ORAL_TABLET | Freq: Every day | ORAL | 1 refills | Status: DC

## 2018-08-30 NOTE — Progress Notes (Signed)
Patient ID: Nathan [name], male   DOB: 10/31/1950, 67 y.o.   MRN: [id number]   [facility] Follow-up Outpatient Visit  Nathan [name] July 21, 1951  Date: 08/30/2018    History of Chief Complaint:   HPI Comments: Nathan [name] is a 67 y/o male with a past psychiatric history significant for Major Depressive Disorder. GAD, insomnia.  The patient returns for psychiatric services for medication management.   Doing fair but gets anxious during later part of day. Feels he may have done more during the day but sometimes says there is not much going on in the evening his mind worries.  Wife and kids are doing well. Duration more so during retirement timie Support: son and wife Severity ; anxiety has worsened some He is retired but that evening he does feel that he may need to have some more activities and is asking to increase the BuSpar as well for anxiety      finances . Duration:10 years  sleeps fair on prn trazadone      Review of Systems  Cardiovascular: Negative for chest pain.  Skin: Negative for rash.  Neurological: Negative for tingling, tremors and headaches.  Psychiatric/Behavioral: Negative for depression. The patient is nervous/anxious.       There were no vitals filed for this visit. Physical Exam  Vitals reviewed.  Constitutional: He appears well-developed and well-nourished. No distress.  Skin: He is not diaphoretic.       Past Medical History: Reviewed  Past Medical History:  Diagnosis Date  . Adenomatous colon polyp   . Allergy   . Anxiety   . CRPS (complex regional pain syndrome), upper limb   . Depression   . Diabetes mellitus without complication ([place])   . Diverticulitis 2013  . Diverticulosis   . Diverticulosis   . GERD (gastroesophageal reflux disease)   . Glaucoma   . History of Helicobacter pylori infection   . Hyperlipidemia   . Hypertension    no per pt  . Intestinal metaplasia of gastric mucosa   . Ligament tear of  upper extremity April 2008   Left elbow  . Nephrolithiasis   . Renal calculi   . Renal disorder       Allergies: Reviewed  Allergies  Allergen Reactions  . Adhesive [Tape]     Hives on area wear tape applied  . Hydrocodone-Acetaminophen Itching  . Hydrocodone Rash   Current Medications: Reviewed  Current Outpatient Medications on File Prior to Visit  Medication Sig Dispense Refill  . atorvastatin (LIPITOR) 40 MG tablet Take 1 tablet (40 mg total) by mouth daily. 90 tablet 3  . Blood Glucose Monitoring Suppl (ONE TOUCH ULTRA SYSTEM KIT) w/Device KIT For testing blood sugars daily Dx: E11.9 1 each 0  . gabapentin (NEURONTIN) 600 MG tablet Take 600 tablets by mouth 2 (two) times daily.     [name] glucose blood test strip For testing BS once daily. DX: E11.9 Onetouch Ultra Blue test strip 100 each 12  . LUMIGAN 0.01 % SOLN     . metFORMIN (GLUCOPHAGE-XR) 500 MG 24 hr tablet Take 1 tablet (500 mg total) by mouth daily with breakfast. 90 tablet 1  . Omega-3 Fatty Acids (FISH OIL PO) Take by mouth daily.    Glory Rosebush DELICA LANCETS FINE MISC For testing blood sugars once daily Dx:E11.9 100 each 12  . triamcinolone cream (KENALOG) 0.1 % Apply 1 application topically daily as needed. (Patient not taking: Reported on 07/24/2018) 30 g 0  . VOLTAREN  1 % GEL   5   No current facility-administered medications on file prior to visit.       Family History: Reviewed  Family History  Problem Relation Age of Onset  . Lung cancer Father   . Kidney disease Mother   . Colon cancer Neg Hx   . Esophageal cancer Neg Hx   . Rectal cancer Neg Hx   . Stomach cancer Neg Hx     Psychiatric specialty examination:  Objective: Appearance: Casual and Well Groomed   Eye Contact:: Good   Speech: Clear and Coherent and Normal Rate   Volume: Normal   Mood: somewhat stressed  Affect: pleasant  Thought Process: Coherent, Logical and Loose   Orientation: Full   Thought Content: WDL   Suicidal Thoughts:  No   Homicidal Thoughts: No   Judgement: Fair   Insight: Fair   Psychomotor Activity: Normal   Akathisia: No   Handed: Right   Memory: Immediate 3/3, recent 3/3   Concentration: Good   AIMS (if indicated): Not Indicated   Assets: Surveyor, mining  Housing  Social Support    Assessment:  AXIS I  Major Depressive Disorder, recurrent, moderate. GAD. Insomnia   Treatment Plan/Recommendations:  Plan of Care:  PLAN:  1. Affirm with the patient that the medications are taken as ordered. Patient  expressed understanding of how their medications were to be used.    Laboratory:  No labs warranted at this time.    Psychotherapy: Therapy: brief supportive therapy provided.  Discussed psychosocial stressors in detail and spent more then 50% of time  Medications:  Anxiety: more at evening. Will increase buspar to bid Continue zoloft Major depression: fair continue zoloft Insomnia: doing fair on trazadone Reviewed side effects Refills sent. FU 4 months             [name], MD  08/30/2018 12:15 PM

## 2018-09-03 DIAGNOSIS — H527 Unspecified disorder of refraction: Secondary | ICD-10-CM | POA: Diagnosis not present

## 2018-09-03 DIAGNOSIS — H25813 Combined forms of age-related cataract, bilateral: Secondary | ICD-10-CM | POA: Diagnosis not present

## 2018-09-03 DIAGNOSIS — H401111 Primary open-angle glaucoma, right eye, mild stage: Secondary | ICD-10-CM | POA: Diagnosis not present

## 2018-09-03 DIAGNOSIS — H02831 Dermatochalasis of right upper eyelid: Secondary | ICD-10-CM | POA: Diagnosis not present

## 2018-09-03 DIAGNOSIS — E119 Type 2 diabetes mellitus without complications: Secondary | ICD-10-CM | POA: Diagnosis not present

## 2018-09-03 DIAGNOSIS — H401122 Primary open-angle glaucoma, left eye, moderate stage: Secondary | ICD-10-CM | POA: Diagnosis not present

## 2018-09-03 DIAGNOSIS — H43813 Vitreous degeneration, bilateral: Secondary | ICD-10-CM | POA: Diagnosis not present

## 2018-09-03 DIAGNOSIS — H02834 Dermatochalasis of left upper eyelid: Secondary | ICD-10-CM | POA: Diagnosis not present

## 2018-09-03 LAB — HM DIABETES EYE EXAM

## 2018-09-26 ENCOUNTER — Encounter: Payer: Self-pay | Admitting: Family Medicine

## 2018-11-06 ENCOUNTER — Other Ambulatory Visit: Payer: Self-pay | Admitting: Family Medicine

## 2018-11-06 DIAGNOSIS — E119 Type 2 diabetes mellitus without complications: Secondary | ICD-10-CM

## 2018-11-29 ENCOUNTER — Other Ambulatory Visit: Payer: Self-pay

## 2018-11-29 ENCOUNTER — Encounter (HOSPITAL_COMMUNITY): Payer: Self-pay | Admitting: Psychiatry

## 2018-11-29 ENCOUNTER — Ambulatory Visit (INDEPENDENT_AMBULATORY_CARE_PROVIDER_SITE_OTHER): Payer: Medicare Other | Admitting: Psychiatry

## 2018-11-29 VITALS — BP 112/74 | HR 75 | Ht 65.0 in | Wt 166.0 lb

## 2018-11-29 DIAGNOSIS — F5102 Adjustment insomnia: Secondary | ICD-10-CM | POA: Diagnosis not present

## 2018-11-29 DIAGNOSIS — F331 Major depressive disorder, recurrent, moderate: Secondary | ICD-10-CM | POA: Diagnosis not present

## 2018-11-29 DIAGNOSIS — F411 Generalized anxiety disorder: Secondary | ICD-10-CM | POA: Diagnosis not present

## 2018-11-29 MED ORDER — SERTRALINE HCL 100 MG PO TABS: 100.0000 mg | ORAL_TABLET | Freq: Every day | ORAL | 0 refills | Status: DC

## 2018-11-29 MED ORDER — TRAZODONE HCL 50 MG PO TABS: 50.0000 mg | ORAL_TABLET | Freq: Every day | ORAL | 1 refills | Status: DC

## 2018-11-29 MED ORDER — BUSPIRONE HCL 15 MG PO TABS: 15.0000 mg | ORAL_TABLET | Freq: Two times a day (BID) | ORAL | 1 refills | Status: DC

## 2018-11-29 NOTE — Progress Notes (Signed)
Patient ID: Nathan [name], male   DOB: 1951/04/07, 68 y.o.   MRN: [id number]   [facility] Follow-up Outpatient Visit  Santos Sollenberger August 25, 1951  Date: 11/29/2018    History of Chief Complaint:   HPI Comments: Mr. Czaja is a 68 y/o male with a past psychiatric history significant for Major Depressive Disorder. GAD, insomnia.  The patient returns for psychiatric services for medication management.    Doing fair. Somewhat anxious when has to go for part time work. Says but wants to earn money and help friend Was talking about xanax and we discussed increasing buspar    Wife and kids are doing well. Duration more so during retirement timie Support: son  And wife Severity ; anxiety worsens when has job pressure    Aggravating factor  finances . Duration:11 years   sleeps fair on prn trazadone      Review of Systems  Cardiovascular: Negative for chest pain.  Skin: Negative for rash.  Neurological: Negative for tingling, tremors and headaches.  Psychiatric/Behavioral: Negative for depression. The patient is nervous/anxious.       Vitals:   11/29/18 1152  BP: 112/74  Pulse: 75  Weight: 166 lb (75.3 kg)  Height: _0  (1.651 m)   Physical Exam  Vitals reviewed.  Constitutional: He appears well-developed and well-nourished. No distress.  Skin: He is not diaphoretic.       Past Medical History: Reviewed  Past Medical History:  Diagnosis Date  . Adenomatous colon polyp   . Allergy   . Anxiety   . CRPS (complex regional pain syndrome), upper limb   . Depression   . Diabetes mellitus without complication ([place])   . Diverticulitis 2013  . Diverticulosis   . Diverticulosis   . GERD (gastroesophageal reflux disease)   . Glaucoma   . History of Helicobacter pylori infection   . Hyperlipidemia   . Hypertension    no per pt  . Intestinal metaplasia of gastric mucosa   . Ligament tear of upper extremity April 2008   Left elbow  .  Nephrolithiasis   . Renal calculi   . Renal disorder       Allergies: Reviewed  Allergies  Allergen Reactions  . Adhesive [Tape]     Hives on area wear tape applied  . Hydrocodone-Acetaminophen Itching  . Hydrocodone Rash   Current Medications: Reviewed  Current Outpatient Medications on File Prior to Visit  Medication Sig Dispense Refill  . atorvastatin (LIPITOR) 40 MG tablet Take 1 tablet (40 mg total) by mouth daily. 90 tablet 3  . Blood Glucose Monitoring Suppl (ONE TOUCH ULTRA SYSTEM KIT) w/Device KIT For testing blood sugars daily Dx: E11.9 1 each 0  . gabapentin (NEURONTIN) 600 MG tablet Take 600 tablets by mouth 2 (two) times daily.     [name] glucose blood (ONE TOUCH ULTRA TEST) test strip USE 1 STRIP TO CHECK GLUCOSE ONCE DAILY 100 each 0  . LUMIGAN 0.01 % SOLN     . metFORMIN (GLUCOPHAGE-XR) 500 MG 24 hr tablet Take 1 tablet (500 mg total) by mouth daily with breakfast. 90 tablet 1  . Omega-3 Fatty Acids (FISH OIL PO) Take by mouth daily.    Glory Rosebush DELICA LANCETS 92E MISC USE 1 LANCET TO CHECK GLUCOSE ONCE DAILY 100 each 0  . sertraline (ZOLOFT) 100 MG tablet Take 1 tablet (100 mg total) by mouth daily. 30 tablet 2  . VOLTAREN 1 % GEL   5  . triamcinolone cream (KENALOG)  0.1 % Apply 1 application topically daily as needed. (Patient not taking: Reported on 07/24/2018) 30 g 0   No current facility-administered medications on file prior to visit.       Family History: Reviewed  Family History  Problem Relation Age of Onset  . Lung cancer Father   . Kidney disease Mother   . Colon cancer Neg Hx   . Esophageal cancer Neg Hx   . Rectal cancer Neg Hx   . Stomach cancer Neg Hx     Psychiatric specialty examination:  Objective: Appearance: Casual and Well Groomed   Eye Contact:: Good   Speech: Clear and Coherent and Normal Rate   Volume: Normal   Mood: somewhat stressed  Affect: pleasant  Thought Process: Coherent, Logical and Loose   Orientation: Full    Thought Content: WDL   Suicidal Thoughts: No   Homicidal Thoughts: No   Judgement: Fair   Insight: Fair   Psychomotor Activity: Normal   Akathisia: No   Handed: Right   Memory: Immediate 3/3, recent 3/3   Concentration: Good   AIMS (if indicated): Not Indicated   Assets: Surveyor, mining  Housing  Social Support    Assessment:  AXIS I  Major Depressive Disorder, recurrent, moderate. GAD. Insomnia   Treatment Plan/Recommendations:  Plan of Care:  PLAN:  1. Affirm with the patient that the medications are taken as ordered. Patient  expressed understanding of how their medications were to be used.    Laboratory:  No labs warranted at this time.    Psychotherapy: Therapy: brief supportive therapy provided.  Discussed psychosocial stressors in detail and spent more then 50% of time  Medications:  Anxiety: more with job pressure, will increase buspar to 42m says use it daily. Reviewed side effects Continue zoloft Major depression: fair continue zoloft Insomnia: doing fair on trazadone Reviewed side effects Refills sent. FU 4 months             NMerian Capron MD  11/29/2018 12:05 PM

## 2018-12-20 ENCOUNTER — Ambulatory Visit (INDEPENDENT_AMBULATORY_CARE_PROVIDER_SITE_OTHER): Payer: Medicare Other | Admitting: Family Medicine

## 2018-12-20 ENCOUNTER — Encounter: Payer: Self-pay | Admitting: Family Medicine

## 2018-12-20 VITALS — BP 131/77 | HR 70 | Temp 97.3°F | Ht 65.0 in | Wt 165.4 lb

## 2018-12-20 DIAGNOSIS — E119 Type 2 diabetes mellitus without complications: Secondary | ICD-10-CM | POA: Diagnosis not present

## 2018-12-20 DIAGNOSIS — F3341 Major depressive disorder, recurrent, in partial remission: Secondary | ICD-10-CM | POA: Diagnosis not present

## 2018-12-20 NOTE — Progress Notes (Signed)
BS:109  Pt reports that he is taking 1/2 tablet of metformin as per Dr. Gardiner Ramus instructions.Maryruth Eve, Lahoma Crocker, CMA

## 2018-12-20 NOTE — Progress Notes (Signed)
Virtual Visit via Telephone Note  I connected with [facility] on 12/20/18 at  3:20 PM EDT by telephone and verified that I am speaking with the correct person using two identifiers.   I discussed the limitations, risks, security and privacy concerns of performing an evaluation and management service by telephone and the availability of in person appointments. I also discussed with the patient that there may be a patient responsible charge related to this service. The patient expressed understanding and agreed to proceed.    Subjective:    CC: DM  HPI:  Diabetes - no hypoglycemic events. No wounds or sores that are not healing well. No increased thirst or urination. Checking glucose at home. Mostly under 100, mostly in ithe 90s.  Taking medications as prescribed without any side effects. Taking 1/2 daily daily.    He is working some.  He did see Dr. De Nurse recently and got his meds erfilled for his depression.  He actually feels like overall he is doing okay is trying to stay busy and says he does not feel overly anxious..   Past medical history, Surgical history, Family history not pertinant except as noted below, Social history, Allergies, and medications have been entered into the medical record, reviewed, and corrections made.    Objective:   General: Speaking clearly in complete sentences without any shortness of breath.  Alert and oriented x3.  Normal judgment. No apparent acute distress.   Impression and Recommendations:    DM -  Plan to hold the mefformin for a few week and watch blood levels. If go back up then restart half a tab.  Plan on recheck the A1C in about month.    Major depressive disorder-currently on BuSpar and Zoloft.  Followed by psychiatry.  He reports he is up-to-date on his medications and overall is doing well.   I discussed the assessment and treatment plan with the patient. The patient was provided an opportunity to ask questions and all were  answered. The patient agreed with the plan and demonstrated an understanding of the instructions.   The patient was advised to call back or seek an in-person evaluation if the symptoms worsen or if the condition fails to improve as anticipated.  I provided 10 minutes of non-face-to-face time during this encounter.   Beatrice Lecher, MD

## 2018-12-27 ENCOUNTER — Ambulatory Visit: Payer: Self-pay | Admitting: Family Medicine

## 2019-01-17 ENCOUNTER — Other Ambulatory Visit: Payer: Self-pay | Admitting: Family Medicine

## 2019-02-12 DIAGNOSIS — M25561 Pain in right knee: Secondary | ICD-10-CM | POA: Diagnosis not present

## 2019-02-12 DIAGNOSIS — M25562 Pain in left knee: Secondary | ICD-10-CM | POA: Diagnosis not present

## 2019-02-28 ENCOUNTER — Ambulatory Visit (HOSPITAL_COMMUNITY): Payer: Medicare Other | Admitting: Psychiatry

## 2019-03-18 ENCOUNTER — Encounter: Payer: Self-pay | Admitting: Family Medicine

## 2019-03-18 ENCOUNTER — Ambulatory Visit (INDEPENDENT_AMBULATORY_CARE_PROVIDER_SITE_OTHER): Payer: Medicare Other | Admitting: Family Medicine

## 2019-03-18 VITALS — BP 133/66 | HR 71 | Ht 65.0 in | Wt 170.0 lb

## 2019-03-18 DIAGNOSIS — E119 Type 2 diabetes mellitus without complications: Secondary | ICD-10-CM | POA: Diagnosis not present

## 2019-03-18 DIAGNOSIS — M5416 Radiculopathy, lumbar region: Secondary | ICD-10-CM | POA: Diagnosis not present

## 2019-03-18 DIAGNOSIS — R109 Unspecified abdominal pain: Secondary | ICD-10-CM | POA: Diagnosis not present

## 2019-03-18 NOTE — Assessment & Plan Note (Signed)
For hemoglobin A1c so we will order with his labs today.

## 2019-03-18 NOTE — Progress Notes (Signed)
Established Patient Office Visit  Subjective:  Patient ID: Jamaal Bernasconi, male    DOB: 23-Feb-1951  Age: 68 y.o. MRN: [id number]  CC:  Chief Complaint  Patient presents with  . Abdominal Pain  . Leg Pain    HPI [facility] presents for Pt reports that for 4 days he has been experiencing R flank pain that goes down into his R leg. He couldn't say if the leg pain was on the back,side or front of his leg.  He stated that his pain is 5/10, dull and comes and goes. It is more apparent when he is sitting for long periods.  He has been doing some computer work recently so has been sitting for longer than usual.  Cristela Blue denies any known injury or trauma to that area.  Does have a prior history of kidney stones and wonders if that could be causing it though he has not noticed any blood in the urine.  No fevers chills or sweats or change in bowel movements.  He denies any f/s/c/n/v/d and his BM's have been normal.   Diabetes - no hypoglycemic events. No wounds or sores that are not healing well. No increased thirst or urination. Checking glucose at home. Taking medications as prescribed without any side effects.   Past Medical History:  Diagnosis Date  . Adenomatous colon polyp   . Allergy   . Anxiety   . CRPS (complex regional pain syndrome), upper limb   . Depression   . Diabetes mellitus without complication ([place])   . Diverticulitis 2013  . Diverticulosis   . Diverticulosis   . GERD (gastroesophageal reflux disease)   . Glaucoma   . History of Helicobacter pylori infection   . Hyperlipidemia   . Hypertension    no per pt  . Intestinal metaplasia of gastric mucosa   . Ligament tear of upper extremity April 2008   Left elbow  . Nephrolithiasis   . Renal calculi   . Renal disorder     Past Surgical History:  Procedure Laterality Date  . LITHOTRIPSY  2004   kidney stones  . REPLACEMENT TOTAL KNEE Right 2014  . ROTATOR CUFF REPAIR Left 2011  . ULNAR NERVE REPAIR Left  2009, 2010,   3 surgeries    Family History  Problem Relation Age of Onset  . Lung cancer Father   . Kidney disease Mother   . Colon cancer Neg Hx   . Esophageal cancer Neg Hx   . Rectal cancer Neg Hx   . Stomach cancer Neg Hx     Social History   Socioeconomic History  . Marital status: Married    Spouse name: Not on file  . Number of children: 1  . Years of education: 78  . Highest education level: Bachelor's degree (e.g., BA, AB, BS)  Occupational History  . Occupation: AIRCRAFT INT [employer]    Employer: [employer]: Retired.   Social Needs  . Financial resource strain: Not hard at all  . Food insecurity    Worry: Never true    Inability: Never true  . Transportation needs    Medical: No    Non-medical: No  Tobacco Use  . Smoking status: Former Smoker    Packs/day: 1.00    Years: 10.00    Pack years: 10.00    Types: Cigarettes    Quit date: 09/25/1981    Years since quitting: 37.5  . Smokeless tobacco: Never Used  Substance and Sexual Activity  .  Alcohol use: Yes    Alcohol/week: 1.0 standard drinks    Types: 1 Cans of beer per week    Comment: 1-2 per wk  . Drug use: No  . Sexual activity: Yes    Partners: Female  Lifestyle  . Physical activity    Days per week: 0 days    Minutes per session: 0 min  . Stress: Not at all  Relationships  . Social connections    Talks on phone: More than three times a week    Gets together: Once a week    Attends religious service: More than 4 times per year    Active member of club or organization: No    Attends meetings of clubs or organizations: Never    Relationship status: Married  . Intimate partner violence    Fear of current or ex partner: No    Emotionally abused: No    Physically abused: No    Forced sexual activity: No  Other Topics Concern  . Not on file  Social History Narrative   Some exercise. Caffeine daily. Plays chess to keep brain stimulated    Outpatient Medications Prior to Visit   Medication Sig Dispense Refill  . atorvastatin (LIPITOR) 40 MG tablet Take 1 tablet (40 mg total) by mouth daily. 90 tablet 3  . BIOTIN 5000 PO Take by mouth.    . Blood Glucose Monitoring Suppl (ONE TOUCH ULTRA SYSTEM KIT) w/Device KIT For testing blood sugars daily Dx: E11.9 1 each 0  . busPIRone (BUSPAR) 15 MG tablet Take 1 tablet (15 mg total) by mouth 2 (two) times daily. 60 tablet 1  . gabapentin (NEURONTIN) 600 MG tablet Take 600 tablets by mouth 2 (two) times daily.     [name] glucose blood (ONE TOUCH ULTRA TEST) test strip USE 1 STRIP TO CHECK GLUCOSE ONCE DAILY 100 each 0  . latanoprost (XALATAN) 0.005 % ophthalmic solution Place 1 drop into both eyes at bedtime.    [name] LUMIGAN 0.01 % SOLN     . metFORMIN (GLUCOPHAGE-XR) 500 MG 24 hr tablet Take 1 tablet by mouth once daily with breakfast 90 tablet 1  . ONETOUCH DELICA LANCETS 16X MISC USE 1 LANCET TO CHECK GLUCOSE ONCE DAILY 100 each 0  . sertraline (ZOLOFT) 100 MG tablet Take 1 tablet (100 mg total) by mouth daily. 90 tablet 0  . traZODone (DESYREL) 50 MG tablet Take 1 tablet (50 mg total) by mouth at bedtime. 30 tablet 1  . vitamin E 100 UNIT capsule Take by mouth daily.    . VOLTAREN 1 % GEL   5  . Omega-3 Fatty Acids (FISH OIL PO) Take by mouth daily.     No facility-administered medications prior to visit.     Allergies  Allergen Reactions  . Adhesive [Tape]     Hives on area wear tape applied  . Hydrocodone-Acetaminophen Itching  . Hydrocodone Rash    ROS Review of Systems    Objective:    Physical Exam  Constitutional: He is oriented to person, place, and time. He appears well-developed and well-nourished.  HENT:  Head: Normocephalic and atraumatic.  Cardiovascular: Normal rate, regular rhythm and normal heart sounds.  Pulmonary/Chest: Effort normal and breath sounds normal.  Abdominal: Soft. Bowel sounds are normal. He exhibits no distension and no mass. There is no abdominal tenderness. There is no rebound and  no guarding.  Musculoskeletal:     Comments: Mildly tender over the right low back just above the  hip bone. Strength is 5/ in the hips, knees and ankles.    Neurological: He is alert and oriented to person, place, and time.  Skin: Skin is warm and dry.  Psychiatric: He has a normal mood and affect. His behavior is normal.    BP 133/66   Pulse 71   Ht _0  (1.651 m)   Wt 170 lb (77.1 kg)   SpO2 97%   BMI 28.29 kg/m  Wt Readings from Last 3 Encounters:  03/18/19 170 lb (77.1 kg)  12/20/18 165 lb 6.4 oz (75 kg)  07/24/18 166 lb (75.3 kg)     Health Maintenance Due  Topic Date Due  . HEMOGLOBIN A1C  12/25/2018    There are no preventive care reminders to display for this patient.  Lab Results  Component Value Date   TSH 2.253 12/27/2011   Lab Results  Component Value Date   WBC 8.6 03/24/2013   HGB 15.9 03/24/2013   HCT 47.1 03/24/2013   MCV 83.4 03/24/2013   PLT 204 03/24/2013   Lab Results  Component Value Date   NA 139 06/25/2018   K 4.1 06/25/2018   CO2 26 06/25/2018   GLUCOSE 112 (H) 06/25/2018   BUN 19 06/25/2018   CREATININE 0.94 06/25/2018   BILITOT 1.4 (H) 06/25/2018   ALKPHOS 105 06/14/2016   AST 25 06/25/2018   ALT 18 06/25/2018   PROT 7.4 06/25/2018   ALBUMIN 4.1 06/14/2016   CALCIUM 9.5 06/25/2018   Lab Results  Component Value Date   CHOL 178 06/25/2018   Lab Results  Component Value Date   HDL 46 06/25/2018   Lab Results  Component Value Date   LDLCALC 106 (H) 06/25/2018   Lab Results  Component Value Date   TRIG 150 (H) 06/25/2018   Lab Results  Component Value Date   CHOLHDL 3.9 06/25/2018   Lab Results  Component Value Date   HGBA1C 5.6 06/25/2018      Assessment & Plan:   Problem List Items Addressed This Visit      Endocrine   Diabetes mellitus, type II ([place]) - Primary   Relevant Orders   COMPLETE METABOLIC PANEL WITH GFR   Hemoglobin A1c   Urinalysis, Routine w reflex microscopic    Other Visit Diagnoses     Right flank pain       Relevant Orders   COMPLETE METABOLIC PANEL WITH GFR   Hemoglobin A1c   Urinalysis, Routine w reflex microscopic   Lumbar back pain with radiculopathy affecting right lower extremity       Relevant Orders   COMPLETE METABOLIC PANEL WITH GFR   Hemoglobin A1c   Urinalysis, Routine w reflex microscopic     Right low back pain -most consistent with musculoskeletal strain.  I do not think that it is a kidney stone that we will get a urinalysis because he has had a prior history of kidney stones.  It sounds like it is radiating down his right outer leg possibly in the sciatic distribution.  We did discuss a trial of prednisone versus an anti-inflammatory.  He says he actually already has a prescription for naproxen that was given to him for his left knee after he saw the orthopedist recently so encouraged him to try taking that twice a day for the next 5 to 7 days to see if it reduces pain and inflammation.  He has been sitting more working on the computer lately so encouraged him to make sure  he is getting up and stretching and avoiding avoiding prolonged sitting.  Also given a handout with stretches to do on his own.  If not improving then consider further work-up with x-rays and possibly physical therapy.  No orders of the defined types were placed in this encounter.   Follow-up: No follow-ups on file.    Beatrice Lecher, MD

## 2019-03-18 NOTE — Progress Notes (Signed)
Pt reports that for 4 days he has been experiencing R flank pain that goes down into his R leg. He couldn't say if the leg pain was on the back,side or front of his leg.  He stated that his pain is 5/10, dull and comes and goes. It is more apparent when he is sitting for long periods.  He denies any f/s/c/n/v/d and his BM's have been normal.   .Elouise Munroe, CMA\

## 2019-03-18 NOTE — Patient Instructions (Signed)
Ok to use the naproxen twice a day for 5-7 days. STart your exercise.

## 2019-03-19 DIAGNOSIS — M5416 Radiculopathy, lumbar region: Secondary | ICD-10-CM | POA: Diagnosis not present

## 2019-03-19 DIAGNOSIS — R109 Unspecified abdominal pain: Secondary | ICD-10-CM | POA: Diagnosis not present

## 2019-03-19 DIAGNOSIS — E119 Type 2 diabetes mellitus without complications: Secondary | ICD-10-CM | POA: Diagnosis not present

## 2019-03-20 LAB — URINALYSIS, ROUTINE W REFLEX MICROSCOPIC
Bilirubin Urine: NEGATIVE
Glucose, UA: NEGATIVE
Hgb urine dipstick: NEGATIVE
Ketones, ur: NEGATIVE
Leukocytes,Ua: NEGATIVE
Nitrite: NEGATIVE
Protein, ur: NEGATIVE
Specific Gravity, Urine: 1.018 (ref 1.001–1.03)
pH: 6.5 (ref 5.0–8.0)

## 2019-03-20 LAB — COMPLETE METABOLIC PANEL WITH GFR
AG Ratio: 1.6 (calc) (ref 1.0–2.5)
ALT: 18 U/L (ref 9–46)
AST: 23 U/L (ref 10–35)
Albumin: 4.3 g/dL (ref 3.6–5.1)
Alkaline phosphatase (APISO): 93 U/L (ref 35–144)
BUN: 21 mg/dL (ref 7–25)
CO2: 27 mmol/L (ref 20–32)
Calcium: 9.7 mg/dL (ref 8.6–10.3)
Chloride: 102 mmol/L (ref 98–110)
Creat: 0.85 mg/dL (ref 0.70–1.25)
GFR, Est African American: 104 mL/min/{1.73_m2} (ref >=60)
GFR, Est Non African American: 90 mL/min/{1.73_m2} (ref >=60)
Globulin: 2.7 g/dL (calc) (ref 1.9–3.7)
Glucose, Bld: 100 mg/dL (ref 65–139)
Potassium: 4.1 mmol/L (ref 3.5–5.3)
Sodium: 139 mmol/L (ref 135–146)
Total Bilirubin: 1.1 mg/dL (ref 0.2–1.2)
Total Protein: 7 g/dL (ref 6.1–8.1)

## 2019-03-20 LAB — HEMOGLOBIN A1C
Hgb A1c MFr Bld: 5.6 % of total Hgb (ref <5.7)
Mean Plasma Glucose: 114 (calc)
eAG (mmol/L): 6.3 (calc)

## 2019-05-29 ENCOUNTER — Other Ambulatory Visit (HOSPITAL_COMMUNITY): Payer: Self-pay | Admitting: Psychiatry

## 2019-05-31 ENCOUNTER — Other Ambulatory Visit (HOSPITAL_COMMUNITY): Payer: Self-pay | Admitting: Psychiatry

## 2019-06-03 ENCOUNTER — Encounter (HOSPITAL_COMMUNITY): Payer: Self-pay | Admitting: Psychiatry

## 2019-06-03 ENCOUNTER — Ambulatory Visit (INDEPENDENT_AMBULATORY_CARE_PROVIDER_SITE_OTHER): Payer: Medicare Other | Admitting: Psychiatry

## 2019-06-03 DIAGNOSIS — F411 Generalized anxiety disorder: Secondary | ICD-10-CM | POA: Diagnosis not present

## 2019-06-03 DIAGNOSIS — F331 Major depressive disorder, recurrent, moderate: Secondary | ICD-10-CM | POA: Diagnosis not present

## 2019-06-03 DIAGNOSIS — F5102 Adjustment insomnia: Secondary | ICD-10-CM | POA: Diagnosis not present

## 2019-06-03 MED ORDER — TRAZODONE HCL 50 MG PO TABS: 50.0000 mg | ORAL_TABLET | Freq: Every day | ORAL | 0 refills | Status: DC

## 2019-06-03 MED ORDER — SERTRALINE HCL 100 MG PO TABS: 100.0000 mg | ORAL_TABLET | Freq: Every day | ORAL | 0 refills | Status: DC

## 2019-06-03 NOTE — Progress Notes (Signed)
Patient ID: Nathan [name], male   DOB: 09/03/1951, 68 y.o.   MRN: [id number]   [facility] Follow-up Outpatient Visit  Keontre Defino Jul 28, 1951  Date: 06/03/2019     I connected with [name] on 06/03/19 at 10:15 AM EDT by telephone and verified that I am speaking with the correct person using two identifiers.   I discussed the limitations, risks, security and privacy concerns of performing an evaluation and management service by telephone and the availability of in person appointments. I also discussed with the patient that there may be a patient responsible charge related to this service. The patient expressed understanding and agreed to proceed.  HPI Comments: Mr. Tomasik is a 68 y/o male with a past psychiatric history significant for Major Depressive Disorder. GAD, insomnia.  The patient returns for psychiatric services for medication management.    Doing fair, says ran low on trazadone and having difficulty sleeping Mood wise fair   Wife and kids are doing well. Duration more so during retirement timie Support: son  And wife Severity ; anxiety worsens when has job pressure    Aggravating factor  finances . Duration:11 years       Review of Systems  Cardiovascular: Negative for chest pain.  Skin: Negative for rash.  Neurological: Negative for tingling, tremors and headaches.  Psychiatric/Behavioral: Negative for depression. The patient has insomnia.       There were no vitals filed for this visit. Physical Exam  Vitals reviewed.  Constitutional: He appears well-developed and well-nourished. No distress.  Skin: He is not diaphoretic.       Past Medical History: Reviewed  Past Medical History:  Diagnosis Date  . Adenomatous colon polyp   . Allergy   . Anxiety   . CRPS (complex regional pain syndrome), upper limb   . Depression   . Diabetes mellitus without complication ([place])   . Diverticulitis 2013  . Diverticulosis   .  Diverticulosis   . GERD (gastroesophageal reflux disease)   . Glaucoma   . History of Helicobacter pylori infection   . Hyperlipidemia   . Hypertension    no per pt  . Intestinal metaplasia of gastric mucosa   . Ligament tear of upper extremity April 2008   Left elbow  . Nephrolithiasis   . Renal calculi   . Renal disorder       Allergies: Reviewed  Allergies  Allergen Reactions  . Adhesive [Tape]     Hives on area wear tape applied  . Hydrocodone-Acetaminophen Itching  . Hydrocodone Rash   Current Medications: Reviewed  Current Outpatient Medications on File Prior to Visit  Medication Sig Dispense Refill  . atorvastatin (LIPITOR) 40 MG tablet Take 1 tablet (40 mg total) by mouth daily. 90 tablet 3  . BIOTIN 5000 PO Take by mouth.    . Blood Glucose Monitoring Suppl (ONE TOUCH ULTRA SYSTEM KIT) w/Device KIT For testing blood sugars daily Dx: E11.9 1 each 0  . busPIRone (BUSPAR) 15 MG tablet Take 1 tablet (15 mg total) by mouth 2 (two) times daily. 60 tablet 1  . gabapentin (NEURONTIN) 600 MG tablet Take 600 tablets by mouth 2 (two) times daily.     [name] glucose blood (ONE TOUCH ULTRA TEST) test strip USE 1 STRIP TO CHECK GLUCOSE ONCE DAILY 100 each 0  . latanoprost (XALATAN) 0.005 % ophthalmic solution Place 1 drop into both eyes at bedtime.    [name] LUMIGAN 0.01 % SOLN     . metFORMIN (  GLUCOPHAGE-XR) 500 MG 24 hr tablet Take 1 tablet by mouth once daily with breakfast 90 tablet 1  . ONETOUCH DELICA LANCETS 65K MISC USE 1 LANCET TO CHECK GLUCOSE ONCE DAILY 100 each 0  . vitamin E 100 UNIT capsule Take by mouth daily.    . VOLTAREN 1 % GEL   5   No current facility-administered medications on file prior to visit.       Family History: Reviewed  Family History  Problem Relation Age of Onset  . Lung cancer Father   . Kidney disease Mother   . Colon cancer Neg Hx   . Esophageal cancer Neg Hx   . Rectal cancer Neg Hx   . Stomach cancer Neg Hx     Psychiatric specialty  examination:  Objective: Appearance:   Eye Contact::   Speech: Clear and Coherent and Normal Rate   Volume: Normal   Mood: fair  Affect: pleasant  Thought Process: Coherent, Logical and Loose   Orientation: Full   Thought Content: WDL   Suicidal Thoughts: No   Homicidal Thoughts: No   Judgement: Fair   Insight: Fair   Psychomotor Activity: Normal   Akathisia: No   Handed: Right   Memory: Immediate 3/3, recent 3/3   Concentration: Good   AIMS (if indicated): Not Indicated   Assets: Surveyor, mining  Housing  Social Support    Assessment:  AXIS I  Major Depressive Disorder, recurrent, moderate. GAD. Insomnia   Treatment Plan/Recommendations:  Plan of Care:  PLAN:  1. Affirm with the patient that the medications are taken as ordered. Patient  expressed understanding of how their medications were to be used.    Laboratory:  No labs warranted at this time.    Psychotherapy: Therapy: brief supportive therapy provided.  Discussed psychosocial stressors in detail and spent more then 50% of time  Medications:  Anxiety: managaeble with buspar,Continue zoloft Major depression: fair continue zoloft Insomnia: poor without trazadone, will send refills. Reviewed sleep hygiene Reviewed side effects Fu 18m    I discussed the assessment and treatment plan with the patient. The patient was provided an opportunity to ask questions and all were answered. The patient agreed with the plan and demonstrated an understanding of the instructions.   The patient was advised to call back or seek an in-person evaluation if the symptoms worsen or if the condition fails to improve as anticipated.            NMerian Capron MD  06/03/2019 10:24 AM

## 2019-06-20 ENCOUNTER — Encounter: Payer: Self-pay | Admitting: Family Medicine

## 2019-06-20 ENCOUNTER — Other Ambulatory Visit: Payer: Self-pay

## 2019-06-20 ENCOUNTER — Emergency Department
Admission: EM | Admit: 2019-06-20 | Discharge: 2019-06-20 | Disposition: A | Discharge status: Home / Self Care | Payer: Medicare Other | Source: Home / Self Care

## 2019-06-20 DIAGNOSIS — Z7184 Encounter for health counseling related to travel: Secondary | ICD-10-CM | POA: Diagnosis not present

## 2019-06-20 DIAGNOSIS — Z0489 Encounter for examination and observation for other specified reasons: Secondary | ICD-10-CM | POA: Diagnosis not present

## 2019-06-20 DIAGNOSIS — U071 COVID-19: Secondary | ICD-10-CM | POA: Diagnosis not present

## 2019-06-20 DIAGNOSIS — J988 Other specified respiratory disorders: Secondary | ICD-10-CM

## 2019-06-20 DIAGNOSIS — E119 Type 2 diabetes mellitus without complications: Secondary | ICD-10-CM

## 2019-06-20 NOTE — Discharge Instructions (Addendum)
Results should be back by Monday.

## 2019-06-20 NOTE — ED Provider Notes (Signed)
[name]    CSN: [id number] Arrival date & time: 06/20/19  1619      History   Chief Complaint Chief Complaint  Patient presents with  . traveling, and needs Covid test    HPI [facility] [name] is a 68 y.o. male.   This is a 68 year old [place] urgent care established patient seeking COVID testing.  His risk factors include being Hispanic and having diabetes mellitus.  His most recent hemoglobin A1c done in June was 5.6.  Patient is traveling to [place] in 10 days and requires a negative cover test to get in.  He has no symptoms of COVID at the present time.     Past Medical History:  Diagnosis Date  . Adenomatous colon polyp   . Allergy   . Anxiety   . CRPS (complex regional pain syndrome), upper limb   . Depression   . Diabetes mellitus without complication ([place])   . Diverticulitis 2013  . Diverticulosis   . Diverticulosis   . GERD (gastroesophageal reflux disease)   . Glaucoma   . History of Helicobacter pylori infection   . Hyperlipidemia   . Hypertension    no per pt  . Intestinal metaplasia of gastric mucosa   . Ligament tear of upper extremity April 2008   Left elbow  . Nephrolithiasis   . Renal calculi   . Renal disorder     Patient Active Problem List   Diagnosis Date Noted  . Diabetes mellitus, type II ([place]) 12/08/2016  . Major depressive disorder, recurrent ([place]) 04/12/2012  . CAUSALGIA OF UPPER LIMB 08/02/2010  . ANEMIA 05/11/2010  . NEUROPATHY 08/10/2009  . NEPHROLITHIASIS 05/22/2009  . GERD 06/23/2008  . ALLERGIC RHINITIS 01/14/2008  . Hyperlipidemia 06/28/2007  . FATTY LIVER DISEASE 06/28/2007  . DISEASE, CYSTIC KIDNEY NOS, CONGENITAL 06/28/2007    Past Surgical History:  Procedure Laterality Date  . LITHOTRIPSY  2004   kidney stones  . REPLACEMENT TOTAL KNEE Right 2014  . ROTATOR CUFF REPAIR Left 2011  . ULNAR NERVE REPAIR Left 2009, 2010,   3 surgeries       Home Medications    Prior to Admission  medications   Medication Sig Start Date End Date Taking? Authorizing Provider  atorvastatin (LIPITOR) 40 MG tablet Take 1 tablet (40 mg total) by mouth daily. 06/26/18   Hali Marry, MD  BIOTIN 5000 PO Take by mouth.    [provider]  Blood Glucose Monitoring Suppl (ONE TOUCH ULTRA SYSTEM KIT) w/Device KIT For testing blood sugars daily Dx: E11.9 12/08/16   Hali Marry, MD  busPIRone (BUSPAR) 15 MG tablet Take 1 tablet (15 mg total) by mouth 2 (two) times daily. 11/29/18   [name], MD  gabapentin (NEURONTIN) 600 MG tablet Take 600 tablets by mouth 2 (two) times daily.  06/29/15   [provider]  glucose blood (ONE TOUCH ULTRA TEST) test strip USE 1 STRIP TO CHECK GLUCOSE ONCE DAILY 11/07/18   Hali Marry, MD  latanoprost (XALATAN) 0.005 % ophthalmic solution Place 1 drop into both eyes at bedtime. 02/26/19   [provider]  LUMIGAN 0.01 % SOLN  12/20/11   [provider]  metFORMIN (GLUCOPHAGE-XR) 500 MG 24 hr tablet Take 1 tablet by mouth once daily with breakfast 01/17/19   Hali Marry, MD  [facility] DELICA LANCETS 53G MISC USE 1 LANCET TO CHECK GLUCOSE ONCE DAILY 11/07/18   Hali Marry, MD  sertraline (ZOLOFT) 100 MG tablet  Take 1 tablet (100 mg total) by mouth daily. 06/03/19   [name], MD  traZODone (DESYREL) 50 MG tablet Take 1 tablet (50 mg total) by mouth at bedtime. 06/03/19   [name], MD  vitamin E 100 UNIT capsule Take by mouth daily.    [provider]  VOLTAREN 1 % GEL  12/30/14   [provider]    Family History Family History  Problem Relation Age of Onset  . Lung cancer Father   . Kidney disease Mother   . Colon cancer Neg Hx   . Esophageal cancer Neg Hx   . Rectal cancer Neg Hx   . Stomach cancer Neg Hx     Social History Social History   Tobacco Use  . Smoking status: Former Smoker    Packs/day: 1.00    Years: 10.00    Pack years: 10.00    Types:  Cigarettes    Quit date: 09/25/1981    Years since quitting: 37.7  . Smokeless tobacco: Never Used  Substance Use Topics  . Alcohol use: Yes    Alcohol/week: 1.0 standard drinks    Types: 1 Cans of beer per week    Comment: 1-2 per wk  . Drug use: No     Allergies   Adhesive [tape], Hydrocodone-acetaminophen, and Hydrocodone   Review of Systems Review of Systems  All other systems reviewed and are negative.    Physical Exam Triage Vital Signs ED Triage Vitals  Enc Vitals Group     BP      Pulse      Resp      Temp      Temp src      SpO2      Weight      Height      Head Circumference      Peak Flow      Pain Score      Pain Loc      Pain Edu?      Excl. in [place]?    No data found.  Updated Vital Signs BP (!) 167/77 (BP Location: Right Arm)   Pulse 80   Temp 98 F (36.7 C) (Oral)   Resp 20   Ht '5\' 6"'  (1.676 m)   Wt 75.8 kg   SpO2 96%   BMI 26.95 kg/m    Physical Exam Vitals signs and nursing note reviewed.  Constitutional:      General: He is not in acute distress.    Appearance: Normal appearance.  HENT:     Head: Normocephalic.  Eyes:     Conjunctiva/sclera: Conjunctivae normal.  Neck:     Musculoskeletal: Normal range of motion and neck supple.  Cardiovascular:     Rate and Rhythm: Normal rate.  Pulmonary:     Effort: Pulmonary effort is normal.  Musculoskeletal: Normal range of motion.  Skin:    General: Skin is warm and dry.  Neurological:     General: No focal deficit present.     Mental Status: He is alert.  Psychiatric:        Mood and Affect: Mood normal.      UC Treatments / Results  Labs (all labs ordered are listed, but only abnormal results are displayed) Labs Reviewed  NOVEL CORONAVIRUS, NAA    EKG   Radiology No results found.  Procedures Procedures (including critical care time)  Medications Ordered in UC Medications - No data to display  Initial Impression / Assessment and Plan /  UC Course  I have  reviewed the triage vital signs and the nursing notes.  Pertinent labs & imaging results that were available during my care of the patient were reviewed by me and considered in my medical decision making (see chart for details).    Final Clinical Impressions(s) / UC Diagnoses   Final diagnoses:  [id number]     Discharge Instructions     Results should be back by Monday.    ED Prescriptions    None     I have reviewed the PDMP during this encounter.   Robyn Haber, MD 06/20/19 1654

## 2019-06-20 NOTE — ED Triage Notes (Signed)
Pt is traveling out of the country next Thursday, and needs a COVID test

## 2019-06-22 LAB — NOVEL CORONAVIRUS, NAA: SARS-CoV-2, NAA: NOT DETECTED

## 2019-06-23 ENCOUNTER — Encounter (HOSPITAL_COMMUNITY): Payer: Self-pay

## 2019-07-04 ENCOUNTER — Telehealth: Payer: Self-pay | Admitting: Family Medicine

## 2019-07-04 DIAGNOSIS — N5089 Other specified disorders of the male genital organs: Secondary | ICD-10-CM

## 2019-07-04 NOTE — Telephone Encounter (Signed)
PT requested a referral to a Urologist. He is having pain in his left testicle. He had a referral 3 years ago for the same issue. Requested the same location

## 2019-07-08 NOTE — Telephone Encounter (Signed)
Referral placed...Ronique Simerly Lynetta, CMA  

## 2019-07-08 NOTE — Addendum Note (Signed)
Addended by: Teddy Spike on: 07/08/2019 12:00 PM   Modules accepted: Orders

## 2019-08-08 DIAGNOSIS — H2513 Age-related nuclear cataract, bilateral: Secondary | ICD-10-CM | POA: Diagnosis not present

## 2019-08-08 DIAGNOSIS — H401132 Primary open-angle glaucoma, bilateral, moderate stage: Secondary | ICD-10-CM | POA: Diagnosis not present

## 2019-08-08 DIAGNOSIS — H16223 Keratoconjunctivitis sicca, not specified as Sjogren's, bilateral: Secondary | ICD-10-CM | POA: Diagnosis not present

## 2019-08-08 LAB — HM DIABETES EYE EXAM

## 2019-08-25 ENCOUNTER — Telehealth: Payer: Self-pay

## 2019-08-25 DIAGNOSIS — E119 Type 2 diabetes mellitus without complications: Secondary | ICD-10-CM

## 2019-08-25 DIAGNOSIS — E785 Hyperlipidemia, unspecified: Secondary | ICD-10-CM

## 2019-08-25 NOTE — Telephone Encounter (Signed)
Labs signed. I would strongly encourage him to restart his statn to reduce his risk of heart attack and stroke.

## 2019-08-25 NOTE — Telephone Encounter (Signed)
Called patient, states he is not taking statin any more. He just stopped taking but for no reason. He is due for a follow up on diabetes and also due for lab work, so we set him up for appt next week on 09/05/19.  I have pended labs for patient to have done prior to his appt, please review and add any that may be missing.   Thanks!

## 2019-08-25 NOTE — Telephone Encounter (Signed)
-----   Message from Hali Marry, MD sent at 08/20/2019 12:20 PM EST ----- Regarding: FW: Statin Therapy Assessment Please call pt and see if needs any assistance with medications or needs a refill. Insurance sent a note saying he hasn't been filling his statin.   Dr. Jerilynn Mages  ----- Message ----- From: Reed Breech D Sent: 08/14/2019  12:48 PM EST To: Hali Marry, MD Subject: Statin Therapy Assessment                      Dr. Madilyn Fireman,   I am a clinical pharmacist that reviews [name] patients for quality initiatives.   [name] carries a diagnosis of clinical ASCVD and is not currently filling a statin. My review of his chart shows a recently expired prescription for atorvastatin 40mg , but according to fill history, the patient has never filled atorvastatin 40mg  in 2020. I called the patient to discuss medication adherence and he reports self-discontinuation of atorvastatin and metformin. The patient reports he was instructed by his doctor that is was ok to stop the metformin. I wanted to follow up with you and verify if atorvastatin and metformin were to be discontinued.   If not, please consider discussing non-adherence with patient and renewing the patient's prescriptions for ATORVASTATIN 40MG  ONCE DAILY #90 with 3 REFILLS, and METFORMIN XR 500MG  24HR ONCE DAILY WITH BREAKFAST #90 with 3 REFILLS.   Please contact me with any questions or concerns.   Thanks,  Bethany D. Shara Blazing, [place] Work Cell: [phone]

## 2019-08-26 NOTE — Telephone Encounter (Signed)
Called patient and advised him labs have been ordered. Patient was agreeable to getting labs prior to appt.   Patient was saying something then phone got disconnected. I called pt back and reitirated directions and left call back info.

## 2019-08-28 DIAGNOSIS — E785 Hyperlipidemia, unspecified: Secondary | ICD-10-CM | POA: Diagnosis not present

## 2019-08-28 DIAGNOSIS — E119 Type 2 diabetes mellitus without complications: Secondary | ICD-10-CM | POA: Diagnosis not present

## 2019-08-29 ENCOUNTER — Other Ambulatory Visit: Payer: Self-pay | Admitting: Family Medicine

## 2019-08-29 LAB — LIPID PANEL W/REFLEX DIRECT LDL
Cholesterol: 229 mg/dL — ABNORMAL HIGH (ref <200)
HDL: 39 mg/dL — ABNORMAL LOW (ref >=40)
Non-HDL Cholesterol (Calc): 190 mg/dL (calc) — ABNORMAL HIGH (ref <130)
Total CHOL/HDL Ratio: 5.9 (calc) — ABNORMAL HIGH (ref <5.0)
Triglycerides: 402 mg/dL — ABNORMAL HIGH (ref <150)

## 2019-08-29 LAB — COMPLETE METABOLIC PANEL WITH GFR
AG Ratio: 1.5 (calc) (ref 1.0–2.5)
ALT: 18 U/L (ref 9–46)
AST: 22 U/L (ref 10–35)
Albumin: 4.6 g/dL (ref 3.6–5.1)
Alkaline phosphatase (APISO): 93 U/L (ref 35–144)
BUN: 17 mg/dL (ref 7–25)
CO2: 28 mmol/L (ref 20–32)
Calcium: 9.5 mg/dL (ref 8.6–10.3)
Chloride: 100 mmol/L (ref 98–110)
Creat: 0.88 mg/dL (ref 0.70–1.25)
GFR, Est African American: 102 mL/min/{1.73_m2} (ref >=60)
GFR, Est Non African American: 88 mL/min/{1.73_m2} (ref >=60)
Globulin: 3 g/dL (calc) (ref 1.9–3.7)
Glucose, Bld: 119 mg/dL — ABNORMAL HIGH (ref 65–99)
Potassium: 4.1 mmol/L (ref 3.5–5.3)
Sodium: 138 mmol/L (ref 135–146)
Total Bilirubin: 0.9 mg/dL (ref 0.2–1.2)
Total Protein: 7.6 g/dL (ref 6.1–8.1)

## 2019-08-29 LAB — HEMOGLOBIN A1C
Hgb A1c MFr Bld: 5.8 % of total Hgb — ABNORMAL HIGH (ref <5.7)
Mean Plasma Glucose: 120 (calc)
eAG (mmol/L): 6.6 (calc)

## 2019-08-29 LAB — DIRECT LDL: Direct LDL: 129 mg/dL — ABNORMAL HIGH (ref <100)

## 2019-08-29 MED ORDER — ATORVASTATIN CALCIUM 40 MG PO TABS: 40.0000 mg | ORAL_TABLET | Freq: Every day | ORAL | 3 refills | Status: DC

## 2019-09-02 ENCOUNTER — Encounter (HOSPITAL_COMMUNITY): Payer: Self-pay | Admitting: Psychiatry

## 2019-09-02 ENCOUNTER — Ambulatory Visit (INDEPENDENT_AMBULATORY_CARE_PROVIDER_SITE_OTHER): Payer: Medicare Other | Admitting: Psychiatry

## 2019-09-02 DIAGNOSIS — F331 Major depressive disorder, recurrent, moderate: Secondary | ICD-10-CM

## 2019-09-02 DIAGNOSIS — F411 Generalized anxiety disorder: Secondary | ICD-10-CM | POA: Diagnosis not present

## 2019-09-02 DIAGNOSIS — F5102 Adjustment insomnia: Secondary | ICD-10-CM

## 2019-09-02 MED ORDER — TRAZODONE HCL 50 MG PO TABS: 50.0000 mg | ORAL_TABLET | Freq: Every day | ORAL | 0 refills | Status: DC

## 2019-09-02 MED ORDER — SERTRALINE HCL 100 MG PO TABS: 100.0000 mg | ORAL_TABLET | Freq: Every day | ORAL | 0 refills | Status: DC

## 2019-09-02 MED ORDER — BUSPIRONE HCL 10 MG PO TABS: 10.0000 mg | ORAL_TABLET | Freq: Two times a day (BID) | ORAL | 1 refills | Status: DC

## 2019-09-02 NOTE — Progress Notes (Signed)
Patient ID: Nathan [name], male   DOB: 1950-10-25, 68 y.o.   MRN: [id number]   [place] Follow-up Outpatient Visit  [name] May 30, 1951  Date: 09/02/2019     I connected with Nathan [name] on 09/02/19 at  1:15 PM EST by telephone and verified that I am speaking with the correct person using two identifiers.   I discussed the limitations, risks, security and privacy concerns of performing an evaluation and management service by telephone and the availability of in person appointments. I also discussed with the patient that there may be a patient responsible charge related to this service. The patient expressed understanding and agreed to proceed.  HPI Comments: [name] is a 68 y/o male with a past psychiatric history significant for Major Depressive Disorder. GAD, insomnia.  The patient returns for psychiatric services for medication management.    Feels anxious around evening. Taking buspar 30m during day only. Worries about finances and not having the extra job he was doing during pandemic    Wife and kids are doing well. Duration: post retirement.  Support: son , wife Severity ; anxiety worsens around eveining    Aggravating factor finances . Duration:12 years       Review of Systems  Cardiovascular: Negative for chest pain.  Skin: Negative for rash.  Psychiatric/Behavioral: Negative for depression. The patient is nervous/anxious.       There were no vitals filed for this visit. Physical Exam        Past Medical History: Reviewed  Past Medical History:  Diagnosis Date  . Adenomatous colon polyp   . Allergy   . Anxiety   . CRPS (complex regional pain syndrome), upper limb   . Depression   . Diabetes mellitus without complication ([place]   . Diverticulitis 2013  . Diverticulosis   . Diverticulosis   . GERD (gastroesophageal reflux disease)   . Glaucoma   . History of Helicobacter pylori infection   . Hyperlipidemia   .  Hypertension    no per pt  . Intestinal metaplasia of gastric mucosa   . Ligament tear of upper extremity April 2008   Left elbow  . Nephrolithiasis   . Renal calculi   . Renal disorder       Allergies: Reviewed  Allergies  Allergen Reactions  . Adhesive [Tape]     Hives on area wear tape applied  . Hydrocodone-Acetaminophen Itching  . Hydrocodone Rash   Current Medications: Reviewed  Current Outpatient Medications on File Prior to Visit  Medication Sig Dispense Refill  . atorvastatin (LIPITOR) 40 MG tablet Take 1 tablet (40 mg total) by mouth daily. 90 tablet 3  . BIOTIN 5000 PO Take by mouth.    . Blood Glucose Monitoring Suppl (ONE TOUCH ULTRA SYSTEM KIT) w/Device KIT For testing blood sugars daily Dx: E11.9 1 each 0  . glucose blood (ONE TOUCH ULTRA TEST) test strip USE 1 STRIP TO CHECK GLUCOSE ONCE DAILY 100 each 0  . latanoprost (XALATAN) 0.005 % ophthalmic solution Place 1 drop into both eyes at bedtime.    .Marland KitchenLUMIGAN 0.01 % SOLN     . metFORMIN (GLUCOPHAGE-XR) 500 MG 24 hr tablet Take 1 tablet by mouth once daily with breakfast 90 tablet 1  . ONETOUCH DELICA LANCETS 331DMISC USE 1 LANCET TO CHECK GLUCOSE ONCE DAILY 100 each 0  . vitamin E 100 UNIT capsule Take by mouth daily.    . VOLTAREN 1 % GEL   5  . [  DISCONTINUED] gabapentin (NEURONTIN) 600 MG tablet Take 600 tablets by mouth 2 (two) times daily.      No current facility-administered medications on file prior to visit.       Family History: Reviewed  Family History  Problem Relation Age of Onset  . Lung cancer Father   . Kidney disease Mother   . Colon cancer Neg Hx   . Esophageal cancer Neg Hx   . Rectal cancer Neg Hx   . Stomach cancer Neg Hx     Psychiatric specialty examination:  Objective: Appearance:   Eye Contact::   Speech: Clear and Coherent and Normal Rate   Volume: Normal   Mood: fair  Affect: pleasant  Thought Process: Coherent, Logical and Loose   Orientation: Full   Thought  Content: WDL   Suicidal Thoughts: No   Homicidal Thoughts: No   Judgement: Fair   Insight: Fair   Psychomotor Activity: Normal   Akathisia: No   Handed: Right   Memory: Immediate 3/3, recent 3/3   Concentration: Good   AIMS (if indicated): Not Indicated   Assets: Communication Skills  Financial Resources/Insurance  Housing  Social Support    Assessment:  AXIS I  Major Depressive Disorder, recurrent, moderate. GAD. Insomnia   Treatment Plan/Recommendations:  Plan of Care:  PLAN:  1. Affirm with the patient that the medications are taken as ordered. Patient  expressed understanding of how their medications were to be used.    Laboratory:  No labs warranted at this time.    Psychotherapy: Therapy: brief supportive therapy provided.  Discussed psychosocial stressors in detail and spent more then 50% of time  Medications:  Anxiety: worsens around evening, change buspar to 10mg and take twice a day Major depression: fair, continue zoloft Insomnia: poor without trazadone. It helps, will continue Reviewed side effects Fu 3m.    I discussed the assessment and treatment plan with the patient. The patient was provided an opportunity to ask questions and all were answered. The patient agreed with the plan and demonstrated an understanding of the instructions.   The patient was advised to call back or seek an in-person evaluation if the symptoms worsen or if the condition fails to improve as anticipated.            [name], MD  09/02/2019 1:14 PM  

## 2019-09-05 ENCOUNTER — Encounter: Payer: Self-pay | Admitting: Family Medicine

## 2019-09-05 ENCOUNTER — Ambulatory Visit (INDEPENDENT_AMBULATORY_CARE_PROVIDER_SITE_OTHER): Payer: Medicare Other | Admitting: Family Medicine

## 2019-09-05 ENCOUNTER — Other Ambulatory Visit: Payer: Self-pay

## 2019-09-05 VITALS — BP 133/69 | HR 80 | Ht 66.0 in | Wt 168.0 lb

## 2019-09-05 DIAGNOSIS — Z23 Encounter for immunization: Secondary | ICD-10-CM | POA: Diagnosis not present

## 2019-09-05 DIAGNOSIS — E119 Type 2 diabetes mellitus without complications: Secondary | ICD-10-CM

## 2019-09-05 DIAGNOSIS — E785 Hyperlipidemia, unspecified: Secondary | ICD-10-CM

## 2019-09-05 LAB — POCT UA - MICROALBUMIN
Creatinine, POC: 100 mg/dL
Microalbumin Ur, POC: 30 mg/L

## 2019-09-05 NOTE — Progress Notes (Signed)
Established Patient Office Visit  Subjective:  Patient ID: Nathan [name], male    DOB: 1951-07-01  Age: 68 y.o. MRN: [id number]  CC:  Chief Complaint  Patient presents with  . Diabetes    HPI [name] presents for   Diabetes - no hypoglycemic events. No wounds or sores that are not healing well. No increased thirst or urination. Checking glucose at home.  He is actually no longer on the Metformin and just working on diet and exercise.  He does have significant OA of his left knee and really needs knee replacement but says he is not planning on doing surgery anytime soon.  He says the recovery was quite tough on his right knee and does not want to go through that again.  Does limit his activities sometimes.  Hyperlipidemia - tolerating stating well with no myalgias or significant side effects.  Lab Results  Component Value Date   CHOL 229 (H) 08/28/2019   CHOL 178 06/25/2018   CHOL 125 06/11/2017   Lab Results  Component Value Date   HDL 39 (L) 08/28/2019   HDL 46 06/25/2018   HDL 46 06/11/2017   Lab Results  Component Value Date   LDLCALC  08/28/2019     Comment:     . LDL cholesterol not calculated. Triglyceride levels greater than 400 mg/dL invalidate calculated LDL results. . Reference range: <100 . Desirable range <100 mg/dL for primary prevention;   <70 mg/dL for patients with CHD or diabetic patients  with > or = 2 CHD risk factors. [name] LDL-C is now calculated using the Martin-Hopkins  calculation, which is a validated novel method providing  better accuracy than the Friedewald equation in the  estimation of LDL-C.  Cresenciano Genre et al. Annamaria Helling. 8110;315(94): 2061-2068  (http://education.[facility].com/faq/FAQ164)    LDLCALC 106 (H) 06/25/2018   LDLCALC 54 06/11/2017   Lab Results  Component Value Date   TRIG 402 (H) 08/28/2019   TRIG 150 (H) 06/25/2018   TRIG 186 (H) 06/11/2017   Lab Results  Component Value Date   CHOLHDL 5.9 (H)  08/28/2019   CHOLHDL 3.9 06/25/2018   CHOLHDL 2.7 06/11/2017   Lab Results  Component Value Date   LDLDIRECT 129 (H) 08/28/2019   LDLDIRECT 127 (H) 08/11/2009     Past Medical History:  Diagnosis Date  . Adenomatous colon polyp   . Allergy   . Anxiety   . CRPS (complex regional pain syndrome), upper limb   . Depression   . Diabetes mellitus without complication ([place])   . Diverticulitis 2013  . Diverticulosis   . Diverticulosis   . GERD (gastroesophageal reflux disease)   . Glaucoma   . History of Helicobacter pylori infection   . Hyperlipidemia   . Hypertension    no per pt  . Intestinal metaplasia of gastric mucosa   . Ligament tear of upper extremity April 2008   Left elbow  . Nephrolithiasis   . Renal calculi   . Renal disorder     Past Surgical History:  Procedure Laterality Date  . LITHOTRIPSY  2004   kidney stones  . REPLACEMENT TOTAL KNEE Right 2014  . ROTATOR CUFF REPAIR Left 2011  . ULNAR NERVE REPAIR Left 2009, 2010,   3 surgeries    Family History  Problem Relation Age of Onset  . Lung cancer Father   . Kidney disease Mother   . Colon cancer Neg Hx   . Esophageal cancer Neg Hx   . Rectal  cancer Neg Hx   . Stomach cancer Neg Hx     Social History   Socioeconomic History  . Marital status: Married    Spouse name: Not on file  . Number of children: 1  . Years of education: 43  . Highest education level: Bachelor's degree (e.g., BA, AB, BS)  Occupational History  . Occupation: AIRCRAFT INT [employer]    Employer: [employer]: Retired.   Tobacco Use  . Smoking status: Former Smoker    Packs/day: 1.00    Years: 10.00    Pack years: 10.00    Types: Cigarettes    Quit date: 09/25/1981    Years since quitting: 37.9  . Smokeless tobacco: Never Used  Substance and Sexual Activity  . Alcohol use: Yes    Alcohol/week: 1.0 standard drinks    Types: 1 Cans of beer per week    Comment: 1-2 per wk  . Drug use: No  . Sexual activity: Yes     Partners: Female  Other Topics Concern  . Not on file  Social History Narrative   Some exercise. Caffeine daily. Plays chess to keep brain stimulated   Social Determinants of Health   Financial Resource Strain:   . Difficulty of Paying Living Expenses: Not on file  Food Insecurity:   . Worried About Charity fundraiser in the Last Year: Not on file  . Ran Out of Food in the Last Year: Not on file  Transportation Needs:   . Lack of Transportation (Medical): Not on file  . Lack of Transportation (Non-Medical): Not on file  Physical Activity:   . Days of Exercise per Week: Not on file  . Minutes of Exercise per Session: Not on file  Stress:   . Feeling of Stress : Not on file  Social Connections:   . Frequency of Communication with Friends and Family: Not on file  . Frequency of Social Gatherings with Friends and Family: Not on file  . Attends Religious Services: Not on file  . Active Member of Clubs or Organizations: Not on file  . Attends Archivist Meetings: Not on file  . Marital Status: Not on file  Intimate Partner Violence:   . Fear of Current or Ex-Partner: Not on file  . Emotionally Abused: Not on file  . Physically Abused: Not on file  . Sexually Abused: Not on file    Outpatient Medications Prior to Visit  Medication Sig Dispense Refill  . atorvastatin (LIPITOR) 40 MG tablet Take 1 tablet (40 mg total) by mouth daily. 90 tablet 3  . Blood Glucose Monitoring Suppl (ONE TOUCH ULTRA SYSTEM KIT) w/Device KIT For testing blood sugars daily Dx: E11.9 1 each 0  . busPIRone (BUSPAR) 10 MG tablet Take 1 tablet (10 mg total) by mouth 2 (two) times daily. 60 tablet 1  . glucose blood (ONE TOUCH ULTRA TEST) test strip USE 1 STRIP TO CHECK GLUCOSE ONCE DAILY 100 each 0  . latanoprost (XALATAN) 0.005 % ophthalmic solution Place 1 drop into both eyes at bedtime.    [name] LUMIGAN 0.01 % SOLN     . ONETOUCH DELICA LANCETS 82M MISC USE 1 LANCET TO CHECK GLUCOSE ONCE DAILY 100  each 0  . sertraline (ZOLOFT) 100 MG tablet Take 1 tablet (100 mg total) by mouth daily. 90 tablet 0  . traZODone (DESYREL) 50 MG tablet Take 1 tablet (50 mg total) by mouth at bedtime. 90 tablet 0  . vitamin E  100 UNIT capsule Take by mouth daily.    . VOLTAREN 1 % GEL   5  . BIOTIN 5000 PO Take by mouth.    . metFORMIN (GLUCOPHAGE-XR) 500 MG 24 hr tablet Take 1 tablet by mouth once daily with breakfast 90 tablet 1  . gabapentin (NEURONTIN) 600 MG tablet Take 600 tablets by mouth 2 (two) times daily.      No facility-administered medications prior to visit.    Allergies  Allergen Reactions  . Adhesive [Tape]     Hives on area wear tape applied  . Hydrocodone-Acetaminophen Itching  . Hydrocodone Rash    ROS Review of Systems    Objective:    Physical Exam  Constitutional: He is oriented to person, place, and time. He appears well-developed and well-nourished.  HENT:  Head: Normocephalic and atraumatic.  Cardiovascular: Normal rate, regular rhythm and normal heart sounds.  No carotid bruits  Pulmonary/Chest: Effort normal and breath sounds normal.  Neurological: He is alert and oriented to person, place, and time.  Skin: Skin is warm and dry.  Psychiatric: He has a normal mood and affect. His behavior is normal.    BP 133/69   Pulse 80   Ht '5\' 6"'  (1.676 m)   Wt 168 lb (76.2 kg)   SpO2 98%   BMI 27.12 kg/m  Wt Readings from Last 3 Encounters:  09/05/19 168 lb (76.2 kg)  06/20/19 167 lb (75.8 kg)  03/18/19 170 lb (77.1 kg)     Health Maintenance Due  Topic Date Due  . OPHTHALMOLOGY EXAM  09/04/2019    There are no preventive care reminders to display for this patient.  Lab Results  Component Value Date   TSH 2.253 12/27/2011   Lab Results  Component Value Date   WBC 8.6 03/24/2013   HGB 15.9 03/24/2013   HCT 47.1 03/24/2013   MCV 83.4 03/24/2013   PLT 204 03/24/2013   Lab Results  Component Value Date   NA 138 08/28/2019   K 4.1 08/28/2019   CO2  28 08/28/2019   GLUCOSE 119 (H) 08/28/2019   BUN 17 08/28/2019   CREATININE 0.88 08/28/2019   BILITOT 0.9 08/28/2019   ALKPHOS 105 06/14/2016   AST 22 08/28/2019   ALT 18 08/28/2019   PROT 7.6 08/28/2019   ALBUMIN 4.1 06/14/2016   CALCIUM 9.5 08/28/2019   Lab Results  Component Value Date   CHOL 229 (H) 08/28/2019   Lab Results  Component Value Date   HDL 39 (L) 08/28/2019   Lab Results  Component Value Date   [facility]  08/28/2019     Comment:     . LDL cholesterol not calculated. Triglyceride levels greater than 400 mg/dL invalidate calculated LDL results. . Reference range: <100 . Desirable range <100 mg/dL for primary prevention;   <70 mg/dL for patients with CHD or diabetic patients  with > or = 2 CHD risk factors. [name] LDL-C is now calculated using the Martin-Hopkins  calculation, which is a validated novel method providing  better accuracy than the Friedewald equation in the  estimation of LDL-C.  Cresenciano Genre et al. Annamaria Helling. 9417;408(14): 2061-2068  (http://education.[facility].com/faq/FAQ164)    Lab Results  Component Value Date   TRIG 402 (H) 08/28/2019   Lab Results  Component Value Date   CHOLHDL 5.9 (H) 08/28/2019   Lab Results  Component Value Date   HGBA1C 5.8 (H) 08/28/2019      Assessment & Plan:   Problem List Items Addressed This  Visit      Endocrine   Diabetes mellitus, type II ([place]) - Primary    Well controlled. Continue current regimen. Follow up in  4 mo      Relevant Orders   POCT UA - Microalbumin (Completed)     Other   Hyperlipidemia    Cholesterol is elevated from previous. Discussed options.  We can increase statin. He wants to continue work on diet and exercise and recheck sooner than 1 year..         Other Visit Diagnoses    Need for prophylactic vaccination against Streptococcus pneumoniae (pneumococcus)       Relevant Orders   Pneumococcal polysaccharide vaccine 23-valent greater than or equal to 2yo  subcutaneous/IM (Completed)      No orders of the defined types were placed in this encounter.   Follow-up: Return in about 4 months (around 01/04/2020) for Diabetes follow-up.    Beatrice Lecher, MD

## 2019-09-05 NOTE — Assessment & Plan Note (Signed)
Well controlled. Continue current regimen. Follow up in  4 mo 

## 2019-09-05 NOTE — Assessment & Plan Note (Addendum)
Cholesterol is elevated from previous. Discussed options.  We can increase statin. He wants to continue work on diet and exercise and recheck sooner than 1 year.[name]

## 2019-09-15 DIAGNOSIS — M25562 Pain in left knee: Secondary | ICD-10-CM | POA: Diagnosis not present

## 2019-09-24 DIAGNOSIS — H25813 Combined forms of age-related cataract, bilateral: Secondary | ICD-10-CM | POA: Diagnosis not present

## 2019-09-24 DIAGNOSIS — H02834 Dermatochalasis of left upper eyelid: Secondary | ICD-10-CM | POA: Diagnosis not present

## 2019-09-24 DIAGNOSIS — H527 Unspecified disorder of refraction: Secondary | ICD-10-CM | POA: Diagnosis not present

## 2019-09-24 DIAGNOSIS — H52203 Unspecified astigmatism, bilateral: Secondary | ICD-10-CM | POA: Diagnosis not present

## 2019-09-24 DIAGNOSIS — E119 Type 2 diabetes mellitus without complications: Secondary | ICD-10-CM | POA: Diagnosis not present

## 2019-09-24 DIAGNOSIS — H401131 Primary open-angle glaucoma, bilateral, mild stage: Secondary | ICD-10-CM | POA: Diagnosis not present

## 2019-09-24 LAB — HM DIABETES EYE EXAM

## 2019-10-03 ENCOUNTER — Encounter: Payer: Self-pay | Admitting: Family Medicine

## 2019-12-01 ENCOUNTER — Ambulatory Visit (INDEPENDENT_AMBULATORY_CARE_PROVIDER_SITE_OTHER): Payer: Medicare Other | Admitting: Psychiatry

## 2019-12-01 ENCOUNTER — Encounter (HOSPITAL_COMMUNITY): Payer: Self-pay | Admitting: Psychiatry

## 2019-12-01 DIAGNOSIS — F331 Major depressive disorder, recurrent, moderate: Secondary | ICD-10-CM | POA: Diagnosis not present

## 2019-12-01 DIAGNOSIS — F411 Generalized anxiety disorder: Secondary | ICD-10-CM

## 2019-12-01 DIAGNOSIS — F5102 Adjustment insomnia: Secondary | ICD-10-CM | POA: Diagnosis not present

## 2019-12-01 MED ORDER — BUSPIRONE HCL 10 MG PO TABS: 10.0000 mg | ORAL_TABLET | Freq: Two times a day (BID) | ORAL | 2 refills | Status: DC

## 2019-12-01 MED ORDER — SERTRALINE HCL 100 MG PO TABS: 100.0000 mg | ORAL_TABLET | Freq: Every day | ORAL | 0 refills | Status: DC

## 2019-12-01 NOTE — Progress Notes (Signed)
Patient ID: Nathan [name], male   DOB: [birth year], 69 y.o.   MRN: [id number]   [place] Follow-up Outpatient Visit  Nathan [name] 1951-02-21  Date: 12/01/2019     I connected with Nathan [name] on 12/01/19 at  3:30 PM EST by telephone and verified that I am speaking with the correct person using two identifiers.  I discussed the limitations, risks, security and privacy concerns of performing an evaluation and management service by telephone and the availability of in person appointments. I also discussed with the patient that there may be a patient responsible charge related to this service. The patient expressed understanding and agreed to proceed.  HPI Comments: Nathan [name] is a 69 y/o male with a past psychiatric history significant for Major Depressive Disorder. GAD, insomnia.  The patient returns for psychiatric services for medication management.    Doing fair, buspar helps anxiety   Nathan [name] and kids are doing well. Duration: post retirement.  Support: Nathan [name] , Nathan [name] Severity ; not worse,     Aggravating factor finances . Duration:12 years       Review of Systems  Cardiovascular: Negative for chest pain.  Psychiatric/Behavioral: Negative for depression.      There were no vitals filed for this visit. Physical Exam        Past Medical History: Reviewed  Past Medical History:  Diagnosis Date  . Adenomatous colon polyp   . Allergy   . Anxiety   . CRPS (complex regional pain syndrome), upper limb   . Depression   . Diabetes mellitus without complication ([place])   . Diverticulitis 2013  . Diverticulosis   . Diverticulosis   . GERD (gastroesophageal reflux disease)   . Glaucoma   . History of Helicobacter pylori infection   . Hyperlipidemia   . Hypertension    no per pt  . Intestinal metaplasia of gastric mucosa   . Ligament tear of upper extremity April 2008   Left elbow  . Nephrolithiasis   . Renal calculi   . Renal disorder        Allergies: Reviewed  Allergies  Allergen Reactions  . Adhesive [Tape]     Hives on area wear tape applied  . Hydrocodone-Acetaminophen Itching  . Hydrocodone Rash   Current Medications: Reviewed  Current Outpatient Medications on File Prior to Visit  Medication Sig Dispense Refill  . atorvastatin (LIPITOR) 40 MG tablet Take 1 tablet (40 mg total) by mouth daily. 90 tablet 3  . Blood Glucose Monitoring Suppl (ONE TOUCH ULTRA SYSTEM KIT) w/Device KIT For testing blood sugars daily Dx: E11.9 1 each 0  . glucose blood (ONE TOUCH ULTRA TEST) test strip USE 1 STRIP TO CHECK GLUCOSE ONCE DAILY 100 each 0  . latanoprost (XALATAN) 0.005 % ophthalmic solution Place 1 drop into both eyes at bedtime.    [name] LUMIGAN 0.01 % SOLN     . ONETOUCH DELICA LANCETS 14H MISC USE 1 LANCET TO CHECK GLUCOSE ONCE DAILY 100 each 0  . traZODone (DESYREL) 50 MG tablet Take 1 tablet (50 mg total) by mouth at bedtime. 90 tablet 0  . vitamin E 100 UNIT capsule Take by mouth daily.    . VOLTAREN 1 % GEL   5   No current facility-administered medications on file prior to visit.      Family History: Reviewed  Family History  Problem Relation Age of Onset  . Lung cancer Father   . Kidney disease Mother   . Colon cancer Neg  Hx   . Esophageal cancer Neg Hx   . Rectal cancer Neg Hx   . Stomach cancer Neg Hx     Psychiatric specialty examination:  Objective: Appearance:   Eye Contact::   Speech: Clear and Coherent and Normal Rate   Volume: Normal   Mood: fair  Affect: pleasant  Thought Process: Coherent, Logical and Loose   Orientation: Full   Thought Content: WDL   Suicidal Thoughts: No   Homicidal Thoughts: No   Judgement: Fair   Insight: Fair   Psychomotor Activity: Normal   Akathisia: No   Handed: Right   Memory: Immediate 3/3, recent 3/3   Concentration: Good   AIMS (if indicated): Not Indicated   Assets: Surveyor, mining  Housing  Social Support     Assessment:  AXIS I  Major Depressive Disorder, recurrent, moderate. GAD. Insomnia   Treatment Plan/Recommendations:  Plan of Care:  PLAN:  1. Affirm with the patient that the medications are taken as ordered. Patient  expressed understanding of how their medications were to be used.    Laboratory:  No labs warranted at this time.    Psychotherapy: Therapy: brief supportive therapy provided.  Discussed psychosocial stressors in detail and spent more then 50% of time  Medications:  Anxiety: fair, continue zoloft and buspar Takes bid or qd buspar at times Major depression: fair, continue zoloft Insomnia: poor without trazadone. It helps, will continue Reviewed side effects Fu 57m  Time spent non face to face: 170m  I discussed the assessment and treatment plan with the patient. The patient was provided an opportunity to ask questions and all were answered. The patient agreed with the plan and demonstrated an understanding of the instructions.   The patient was advised to call back or seek an in-person evaluation if the symptoms worsen or if the condition fails to improve as anticipated.            NAMerian CapronMD  12/01/2019 3:39 PM

## 2020-01-05 ENCOUNTER — Encounter: Payer: Self-pay | Admitting: Family Medicine

## 2020-01-05 ENCOUNTER — Other Ambulatory Visit: Payer: Self-pay

## 2020-01-05 ENCOUNTER — Ambulatory Visit (INDEPENDENT_AMBULATORY_CARE_PROVIDER_SITE_OTHER): Payer: Medicare Other | Admitting: Family Medicine

## 2020-01-05 VITALS — BP 117/65 | HR 70 | Ht 66.0 in | Wt 173.0 lb

## 2020-01-05 DIAGNOSIS — E785 Hyperlipidemia, unspecified: Secondary | ICD-10-CM

## 2020-01-05 DIAGNOSIS — E119 Type 2 diabetes mellitus without complications: Secondary | ICD-10-CM | POA: Diagnosis not present

## 2020-01-05 DIAGNOSIS — F3341 Major depressive disorder, recurrent, in partial remission: Secondary | ICD-10-CM

## 2020-01-05 DIAGNOSIS — M1712 Unilateral primary osteoarthritis, left knee: Secondary | ICD-10-CM | POA: Diagnosis not present

## 2020-01-05 LAB — POCT GLYCOSYLATED HEMOGLOBIN (HGB A1C): Hemoglobin A1C: 5.7 % — AB (ref 4.0–5.6)

## 2020-01-05 MED ORDER — ONETOUCH DELICA LANCETS 30G MISC: 1.0000 | Freq: Every morning | 99 refills | Status: DC

## 2020-01-05 MED ORDER — MUPIROCIN 2 % EX OINT: TOPICAL_OINTMENT | Freq: Two times a day (BID) | CUTANEOUS | 0 refills | Status: DC | PRN

## 2020-01-05 MED ORDER — GLUCOSE BLOOD VI STRP: ORAL_STRIP | 0 refills | Status: DC

## 2020-01-05 NOTE — Assessment & Plan Note (Signed)
Planning to delay knee replacement.

## 2020-01-05 NOTE — Progress Notes (Signed)
Established Patient Office Visit  Subjective:  Patient ID: Nathan [name], male    DOB: 11-10-1950  Age: 69 y.o. MRN: [id number]  CC:  Chief Complaint  Patient presents with  . Diabetes  . Hypertension    HPI [name] presents for   Diabetes - no hypoglycemic events. No wounds or sores that are not healing well. No increased thirst or urination. Checking glucose at home. Taking medications as prescribed without any side effects.  Does report that he needs refills on his lancets and strips.  Reports most of his blood sugars are under 110.  Hyperlipidemia and hypertriglyceridemia-we did check his labs in December he had not been taking his medication consistently but says he has been back on it pretty regularly and tolerating it well.  He also wanted let me know that he is having bilateral cataract surgery at the end of the month as well as some type of treatment for glaucoma.  He is also having having left knee pain and it has been recommended that he have knee replacement but he is on working on some big projects right now and so really wants to push it out as far as he can.  But he has reported that has kept him from exercising.  Past Medical History:  Diagnosis Date  . Adenomatous colon polyp   . Allergy   . Anxiety   . CRPS (complex regional pain syndrome), upper limb   . Depression   . Diabetes mellitus without complication ([place])   . Diverticulitis 2013  . Diverticulosis   . Diverticulosis   . GERD (gastroesophageal reflux disease)   . Glaucoma   . History of Helicobacter pylori infection   . Hyperlipidemia   . Hypertension    no per pt  . Intestinal metaplasia of gastric mucosa   . Ligament tear of upper extremity April 2008   Left elbow  . Nephrolithiasis   . Renal calculi   . Renal disorder     Past Surgical History:  Procedure Laterality Date  . LITHOTRIPSY  2004   kidney stones  . REPLACEMENT TOTAL KNEE Right 2014  . ROTATOR CUFF REPAIR Left  2011  . ULNAR NERVE REPAIR Left 2009, 2010,   3 surgeries    Family History  Problem Relation Age of Onset  . Lung cancer Father   . Kidney disease Mother   . Colon cancer Neg Hx   . Esophageal cancer Neg Hx   . Rectal cancer Neg Hx   . Stomach cancer Neg Hx     Social History   Socioeconomic History  . Marital status: Married    Spouse name: Not on file  . Number of children: 1  . Years of education: 67  . Highest education level: Bachelor's degree (e.g., BA, AB, BS)  Occupational History  . Occupation: AIRCRAFT INT [employer]    Employer: [employer]: Retired.   Tobacco Use  . Smoking status: Former Smoker    Packs/day: 1.00    Years: 10.00    Pack years: 10.00    Types: Cigarettes    Quit date: 09/25/1981    Years since quitting: 38.3  . Smokeless tobacco: Never Used  Substance and Sexual Activity  . Alcohol use: Yes    Alcohol/week: 1.0 standard drinks    Types: 1 Cans of beer per week    Comment: 1-2 per wk  . Drug use: No  . Sexual activity: Yes    Partners: Female  Other Topics Concern  . Not on file  Social History Narrative   Some exercise. Caffeine daily. Plays chess to keep brain stimulated   Social Determinants of Health   Financial Resource Strain:   . Difficulty of Paying Living Expenses:   Food Insecurity:   . Worried About Charity fundraiser in the Last Year:   . Arboriculturist in the Last Year:   Transportation Needs:   . Film/video editor (Medical):   [name] Lack of Transportation (Non-Medical):   Physical Activity:   . Days of Exercise per Week:   . Minutes of Exercise per Session:   Stress:   . Feeling of Stress :   Social Connections:   . Frequency of Communication with Friends and Family:   . Frequency of Social Gatherings with Friends and Family:   . Attends Religious Services:   . Active Member of Clubs or Organizations:   . Attends Archivist Meetings:   [name] Marital Status:   Intimate Partner Violence:   . Fear  of Current or Ex-Partner:   . Emotionally Abused:   [name] Physically Abused:   . Sexually Abused:     Outpatient Medications Prior to Visit  Medication Sig Dispense Refill  . atorvastatin (LIPITOR) 40 MG tablet Take 1 tablet (40 mg total) by mouth daily. 90 tablet 3  . Blood Glucose Monitoring Suppl (ONE TOUCH ULTRA SYSTEM KIT) w/Device KIT For testing blood sugars daily Dx: E11.9 1 each 0  . busPIRone (BUSPAR) 10 MG tablet Take 1 tablet (10 mg total) by mouth 2 (two) times daily. 60 tablet 2  . latanoprost (XALATAN) 0.005 % ophthalmic solution Place 1 drop into both eyes at bedtime.    [name] LUMIGAN 0.01 % SOLN     . sertraline (ZOLOFT) 100 MG tablet Take 1 tablet (100 mg total) by mouth daily. 90 tablet 0  . traZODone (DESYREL) 50 MG tablet Take 1 tablet (50 mg total) by mouth at bedtime. 90 tablet 0  . vitamin E 100 UNIT capsule Take by mouth daily.    . VOLTAREN 1 % GEL   5  . glucose blood (ONE TOUCH ULTRA TEST) test strip USE 1 STRIP TO CHECK GLUCOSE ONCE DAILY 100 each 0  . ONETOUCH DELICA LANCETS 76E MISC USE 1 LANCET TO CHECK GLUCOSE ONCE DAILY 100 each 0   No facility-administered medications prior to visit.    Allergies  Allergen Reactions  . Adhesive [Tape]     Hives on area wear tape applied  . Hydrocodone-Acetaminophen Itching  . Hydrocodone Rash    ROS Review of Systems    Objective:    Physical Exam  Constitutional: He is oriented to person, place, and time. He appears well-developed and well-nourished.  HENT:  Head: Normocephalic and atraumatic.  Cardiovascular: Normal rate, regular rhythm and normal heart sounds.  Pulmonary/Chest: Effort normal and breath sounds normal.  Neurological: He is alert and oriented to person, place, and time.  Skin: Skin is warm and dry.  Psychiatric: He has a normal mood and affect. His behavior is normal.    BP 117/65   Pulse 70   Ht '5\' 6"'  (1.676 m)   Wt 173 lb (78.5 kg)   SpO2 98%   BMI 27.92 kg/m  Wt Readings from Last 3  Encounters:  01/05/20 173 lb (78.5 kg)  09/05/19 168 lb (76.2 kg)  06/20/19 167 lb (75.8 kg)     There are no preventive care reminders to  display for this patient.  There are no preventive care reminders to display for this patient.  Lab Results  Component Value Date   TSH 2.253 12/27/2011   Lab Results  Component Value Date   WBC 8.6 03/24/2013   HGB 15.9 03/24/2013   HCT 47.1 03/24/2013   MCV 83.4 03/24/2013   PLT 204 03/24/2013   Lab Results  Component Value Date   NA 138 08/28/2019   K 4.1 08/28/2019   CO2 28 08/28/2019   GLUCOSE 119 (H) 08/28/2019   BUN 17 08/28/2019   CREATININE 0.88 08/28/2019   BILITOT 0.9 08/28/2019   ALKPHOS 105 06/14/2016   AST 22 08/28/2019   ALT 18 08/28/2019   PROT 7.6 08/28/2019   ALBUMIN 4.1 06/14/2016   CALCIUM 9.5 08/28/2019   Lab Results  Component Value Date   CHOL 229 (H) 08/28/2019   Lab Results  Component Value Date   HDL 39 (L) 08/28/2019   Lab Results  Component Value Date   [facility]  08/28/2019     Comment:     . LDL cholesterol not calculated. Triglyceride levels greater than 400 mg/dL invalidate calculated LDL results. . Reference range: <100 . Desirable range <100 mg/dL for primary prevention;   <70 mg/dL for patients with CHD or diabetic patients  with > or = 2 CHD risk factors. [name] LDL-C is now calculated using the Martin-Hopkins  calculation, which is a validated novel method providing  better accuracy than the Friedewald equation in the  estimation of LDL-C.  Cresenciano Genre et al. Annamaria Helling. 7169;678(93): 2061-2068  (http://education.[facility].com/faq/FAQ164)    Lab Results  Component Value Date   TRIG 402 (H) 08/28/2019   Lab Results  Component Value Date   CHOLHDL 5.9 (H) 08/28/2019   Lab Results  Component Value Date   HGBA1C 5.7 (A) 01/05/2020      Assessment & Plan:   Problem List Items Addressed This Visit      Endocrine   Diabetes mellitus, type II ([place]) - Primary    Well  controlled. Continue current regimen. Follow up in  4 months.  He is not able to exercise bc of his left knee pain.        Relevant Medications   glucose blood (ONE TOUCH ULTRA TEST) test strip   Other Relevant Orders   COMPLETE METABOLIC PANEL WITH GFR   Lipid Panel w/reflex Direct LDL   POCT glycosylated hemoglobin (Hb A1C) (Completed)     Musculoskeletal and Integument   Osteoarthritis of left knee    Planning to delay knee replacement.          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  He feels like he is doing well on his regimen.      Hyperlipidemia    Taking meds regularly. Due to recheck lipids and liver enzymes.           Hyperlipidemia - see note under problem list.    Meds ordered this encounter  Medications  . mupirocin ointment (BACTROBAN) 2 %    Sig: Apply topically 2 (two) times daily as needed. Ap    Dispense:  30 g    Refill:  0  . glucose blood (ONE TOUCH ULTRA TEST) test strip    Sig: USE 1 STRIP TO CHECK GLUCOSE ONCE DAILY    Dispense:  100 each    Refill:  0  . OneTouch Delica Lancets 81O MISC  Sig: 1 strip by Other route in the morning.    Dispense:  100 each    Refill:  PRN    Follow-up: Return in about 4 months (around 05/06/2020) for Diabetes follow-up.    Beatrice Lecher, MD

## 2020-01-05 NOTE — Assessment & Plan Note (Addendum)
Taking meds regularly. Due to recheck lipids and liver enzymes.

## 2020-01-05 NOTE — Assessment & Plan Note (Signed)
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  He feels like he is doing well on his regimen.

## 2020-01-05 NOTE — Assessment & Plan Note (Signed)
Well controlled. Continue current regimen. Follow up in  4 months.  He is not able to exercise bc of his left knee pain.

## 2020-01-12 DIAGNOSIS — E119 Type 2 diabetes mellitus without complications: Secondary | ICD-10-CM | POA: Diagnosis not present

## 2020-01-12 LAB — COMPLETE METABOLIC PANEL WITH GFR
AG Ratio: 1.6 (calc) (ref 1.0–2.5)
ALT: 21 U/L (ref 9–46)
AST: 20 U/L (ref 10–35)
Albumin: 4.4 g/dL (ref 3.6–5.1)
Alkaline phosphatase (APISO): 113 U/L (ref 35–144)
BUN: 21 mg/dL (ref 7–25)
CO2: 30 mmol/L (ref 20–32)
Calcium: 9.4 mg/dL (ref 8.6–10.3)
Chloride: 104 mmol/L (ref 98–110)
Creat: 0.78 mg/dL (ref 0.70–1.25)
GFR, Est African American: 107 mL/min/{1.73_m2} (ref >=60)
GFR, Est Non African American: 93 mL/min/{1.73_m2} (ref >=60)
Globulin: 2.7 g/dL (calc) (ref 1.9–3.7)
Glucose, Bld: 124 mg/dL — ABNORMAL HIGH (ref 65–99)
Potassium: 4.3 mmol/L (ref 3.5–5.3)
Sodium: 141 mmol/L (ref 135–146)
Total Bilirubin: 0.9 mg/dL (ref 0.2–1.2)
Total Protein: 7.1 g/dL (ref 6.1–8.1)

## 2020-01-12 LAB — LIPID PANEL W/REFLEX DIRECT LDL
Cholesterol: 130 mg/dL (ref <200)
HDL: 44 mg/dL (ref >=40)
LDL Cholesterol (Calc): 61 mg/dL (calc)
Non-HDL Cholesterol (Calc): 86 mg/dL (calc) (ref <130)
Total CHOL/HDL Ratio: 3 (calc) (ref <5.0)
Triglycerides: 175 mg/dL — ABNORMAL HIGH (ref <150)

## 2020-01-15 DIAGNOSIS — H52203 Unspecified astigmatism, bilateral: Secondary | ICD-10-CM | POA: Diagnosis not present

## 2020-01-15 DIAGNOSIS — H25813 Combined forms of age-related cataract, bilateral: Secondary | ICD-10-CM | POA: Diagnosis not present

## 2020-01-15 DIAGNOSIS — H401131 Primary open-angle glaucoma, bilateral, mild stage: Secondary | ICD-10-CM | POA: Diagnosis not present

## 2020-01-19 DIAGNOSIS — H4010X0 Unspecified open-angle glaucoma, stage unspecified: Secondary | ICD-10-CM | POA: Diagnosis not present

## 2020-01-19 DIAGNOSIS — H25812 Combined forms of age-related cataract, left eye: Secondary | ICD-10-CM | POA: Diagnosis not present

## 2020-01-19 DIAGNOSIS — Z20822 Contact with and (suspected) exposure to covid-19: Secondary | ICD-10-CM | POA: Diagnosis not present

## 2020-01-19 DIAGNOSIS — Z01812 Encounter for preprocedural laboratory examination: Secondary | ICD-10-CM | POA: Diagnosis not present

## 2020-01-22 DIAGNOSIS — H25813 Combined forms of age-related cataract, bilateral: Secondary | ICD-10-CM | POA: Diagnosis not present

## 2020-01-22 DIAGNOSIS — H02834 Dermatochalasis of left upper eyelid: Secondary | ICD-10-CM | POA: Diagnosis not present

## 2020-01-22 DIAGNOSIS — H401121 Primary open-angle glaucoma, left eye, mild stage: Secondary | ICD-10-CM | POA: Diagnosis not present

## 2020-01-22 DIAGNOSIS — H43813 Vitreous degeneration, bilateral: Secondary | ICD-10-CM | POA: Diagnosis not present

## 2020-01-22 DIAGNOSIS — H401131 Primary open-angle glaucoma, bilateral, mild stage: Secondary | ICD-10-CM | POA: Diagnosis not present

## 2020-01-22 DIAGNOSIS — H401120 Primary open-angle glaucoma, left eye, stage unspecified: Secondary | ICD-10-CM | POA: Diagnosis not present

## 2020-01-22 DIAGNOSIS — H401122 Primary open-angle glaucoma, left eye, moderate stage: Secondary | ICD-10-CM | POA: Diagnosis not present

## 2020-01-22 DIAGNOSIS — H02831 Dermatochalasis of right upper eyelid: Secondary | ICD-10-CM | POA: Diagnosis not present

## 2020-01-22 DIAGNOSIS — H52203 Unspecified astigmatism, bilateral: Secondary | ICD-10-CM | POA: Diagnosis not present

## 2020-01-22 DIAGNOSIS — H35033 Hypertensive retinopathy, bilateral: Secondary | ICD-10-CM | POA: Diagnosis not present

## 2020-01-22 DIAGNOSIS — H25812 Combined forms of age-related cataract, left eye: Secondary | ICD-10-CM | POA: Insufficient documentation

## 2020-01-22 DIAGNOSIS — E1136 Type 2 diabetes mellitus with diabetic cataract: Secondary | ICD-10-CM | POA: Diagnosis not present

## 2020-01-22 DIAGNOSIS — H527 Unspecified disorder of refraction: Secondary | ICD-10-CM | POA: Diagnosis not present

## 2020-01-22 DIAGNOSIS — E785 Hyperlipidemia, unspecified: Secondary | ICD-10-CM | POA: Diagnosis not present

## 2020-01-23 DIAGNOSIS — Z961 Presence of intraocular lens: Secondary | ICD-10-CM | POA: Diagnosis not present

## 2020-01-23 DIAGNOSIS — H25811 Combined forms of age-related cataract, right eye: Secondary | ICD-10-CM | POA: Diagnosis not present

## 2020-01-23 DIAGNOSIS — H401131 Primary open-angle glaucoma, bilateral, mild stage: Secondary | ICD-10-CM | POA: Diagnosis not present

## 2020-01-23 DIAGNOSIS — H52203 Unspecified astigmatism, bilateral: Secondary | ICD-10-CM | POA: Diagnosis not present

## 2020-01-23 LAB — HM DIABETES EYE EXAM

## 2020-01-29 DIAGNOSIS — E785 Hyperlipidemia, unspecified: Secondary | ICD-10-CM | POA: Diagnosis not present

## 2020-01-29 DIAGNOSIS — H401110 Primary open-angle glaucoma, right eye, stage unspecified: Secondary | ICD-10-CM | POA: Diagnosis not present

## 2020-01-29 DIAGNOSIS — H35033 Hypertensive retinopathy, bilateral: Secondary | ICD-10-CM | POA: Diagnosis not present

## 2020-01-29 DIAGNOSIS — G90519 Complex regional pain syndrome I of unspecified upper limb: Secondary | ICD-10-CM | POA: Diagnosis not present

## 2020-01-29 DIAGNOSIS — H43813 Vitreous degeneration, bilateral: Secondary | ICD-10-CM | POA: Diagnosis not present

## 2020-01-29 DIAGNOSIS — H401112 Primary open-angle glaucoma, right eye, moderate stage: Secondary | ICD-10-CM | POA: Diagnosis not present

## 2020-01-29 DIAGNOSIS — H527 Unspecified disorder of refraction: Secondary | ICD-10-CM | POA: Diagnosis not present

## 2020-01-29 DIAGNOSIS — H4010X3 Unspecified open-angle glaucoma, severe stage: Secondary | ICD-10-CM | POA: Insufficient documentation

## 2020-01-29 DIAGNOSIS — H02834 Dermatochalasis of left upper eyelid: Secondary | ICD-10-CM | POA: Diagnosis not present

## 2020-01-29 DIAGNOSIS — E1136 Type 2 diabetes mellitus with diabetic cataract: Secondary | ICD-10-CM | POA: Diagnosis not present

## 2020-01-29 DIAGNOSIS — H02831 Dermatochalasis of right upper eyelid: Secondary | ICD-10-CM | POA: Diagnosis not present

## 2020-01-29 DIAGNOSIS — H401111 Primary open-angle glaucoma, right eye, mild stage: Secondary | ICD-10-CM | POA: Diagnosis not present

## 2020-01-29 DIAGNOSIS — H52203 Unspecified astigmatism, bilateral: Secondary | ICD-10-CM | POA: Diagnosis not present

## 2020-01-29 DIAGNOSIS — H25811 Combined forms of age-related cataract, right eye: Secondary | ICD-10-CM | POA: Diagnosis not present

## 2020-01-29 DIAGNOSIS — H401131 Primary open-angle glaucoma, bilateral, mild stage: Secondary | ICD-10-CM | POA: Diagnosis not present

## 2020-01-30 DIAGNOSIS — H401131 Primary open-angle glaucoma, bilateral, mild stage: Secondary | ICD-10-CM | POA: Diagnosis not present

## 2020-01-30 DIAGNOSIS — Z961 Presence of intraocular lens: Secondary | ICD-10-CM | POA: Diagnosis not present

## 2020-02-03 DIAGNOSIS — Z961 Presence of intraocular lens: Secondary | ICD-10-CM | POA: Diagnosis not present

## 2020-02-06 DIAGNOSIS — Z961 Presence of intraocular lens: Secondary | ICD-10-CM | POA: Diagnosis not present

## 2020-02-09 ENCOUNTER — Other Ambulatory Visit (HOSPITAL_COMMUNITY): Payer: Self-pay | Admitting: Psychiatry

## 2020-02-29 DIAGNOSIS — Z20822 Contact with and (suspected) exposure to covid-19: Secondary | ICD-10-CM | POA: Diagnosis not present

## 2020-03-15 ENCOUNTER — Telehealth (INDEPENDENT_AMBULATORY_CARE_PROVIDER_SITE_OTHER): Payer: Medicare Other | Admitting: Psychiatry

## 2020-03-15 ENCOUNTER — Encounter (HOSPITAL_COMMUNITY): Payer: Self-pay | Admitting: Psychiatry

## 2020-03-15 DIAGNOSIS — F411 Generalized anxiety disorder: Secondary | ICD-10-CM | POA: Diagnosis not present

## 2020-03-15 DIAGNOSIS — F5102 Adjustment insomnia: Secondary | ICD-10-CM

## 2020-03-15 DIAGNOSIS — F331 Major depressive disorder, recurrent, moderate: Secondary | ICD-10-CM

## 2020-03-15 MED ORDER — SERTRALINE HCL 100 MG PO TABS: 100.0000 mg | ORAL_TABLET | Freq: Every day | ORAL | 0 refills | Status: DC

## 2020-03-15 NOTE — Progress Notes (Signed)
Patient ID: Nathan [name], male   DOB: November 02, 1950, 69 y.o.   MRN: [id number]   [facility] Out[facility] Visit  Nathan [name] 12-19-1950  Date: 03/15/2020      I connected with [name] on 03/15/20 at  3:30 PM EDT by telephone and verified that I am speaking with the correct person using two identifiers.  I discussed the limitations, risks, security and privacy concerns of performing an evaluation and management service by telephone and the availability of in person appointments. I also discussed with the patient that there may be a patient responsible charge related to this service. The patient expressed understanding and agreed to proceed.  Patient location: home Provider location : home  HPI Comments: Mr. Wingert is a 69 y/o male with a past psychiatric history significant for Major Depressive Disorder. GAD, insomnia.  The patient returns for psychiatric services for medication management.    Doing fair, takes buspar now prn and helps anxiety, agitation   Wife and kids are doing well. Duration: post retirement.  Support: son , wife Severity ; not worse,     Aggravating factor Finances. Step daughter . Duration:12 years       Review of Systems  Cardiovascular: Negative for chest pain.  Psychiatric/Behavioral: Negative for depression.      There were no vitals filed for this visit. Physical Exam        Past Medical History: Reviewed  Past Medical History:  Diagnosis Date  . Adenomatous colon polyp   . Allergy   . Anxiety   . CRPS (complex regional pain syndrome), upper limb   . Depression   . Diabetes mellitus without complication ([place])   . Diverticulitis 2013  . Diverticulosis   . Diverticulosis   . GERD (gastroesophageal reflux disease)   . Glaucoma   . History of Helicobacter pylori infection   . Hyperlipidemia   . Hypertension    no per pt  . Intestinal metaplasia of gastric mucosa   . Ligament tear of upper  extremity April 2008   Left elbow  . Nephrolithiasis   . Renal calculi   . Renal disorder       Allergies: Reviewed  Allergies  Allergen Reactions  . Adhesive [Tape]     Hives on area wear tape applied  . Hydrocodone-Acetaminophen Itching  . Hydrocodone Rash   Current Medications: Reviewed  Current Outpatient Medications on File Prior to Visit  Medication Sig Dispense Refill  . atorvastatin (LIPITOR) 40 MG tablet Take 1 tablet (40 mg total) by mouth daily. 90 tablet 3  . Blood Glucose Monitoring Suppl (ONE TOUCH ULTRA SYSTEM KIT) w/Device KIT For testing blood sugars daily Dx: E11.9 1 each 0  . busPIRone (BUSPAR) 10 MG tablet Take 1 tablet (10 mg total) by mouth 2 (two) times daily. 60 tablet 2  . glucose blood (ONE TOUCH ULTRA TEST) test strip USE 1 STRIP TO CHECK GLUCOSE ONCE DAILY 100 each 0  . latanoprost (XALATAN) 0.005 % ophthalmic solution Place 1 drop into both eyes at bedtime.    [name] LUMIGAN 0.01 % SOLN     . mupirocin ointment (BACTROBAN) 2 % Apply topically 2 (two) times daily as needed. Ap 30 g 0  . OneTouch Delica Lancets 40J MISC 1 strip by Other route in the morning. 100 each PRN  . traZODone (DESYREL) 50 MG tablet TAKE 1 TABLET BY MOUTH AT BEDTIME 90 tablet 0  . vitamin E 100 UNIT capsule Take by mouth daily.    [name]  VOLTAREN 1 % GEL   5   No current facility-administered medications on file prior to visit.      Family History: Reviewed  Family History  Problem Relation Age of Onset  . Lung cancer Father   . Kidney disease Mother   . Colon cancer Neg Hx   . Esophageal cancer Neg Hx   . Rectal cancer Neg Hx   . Stomach cancer Neg Hx     Psychiatric specialty examination:  Objective: Appearance:   Eye Contact::   Speech: Clear and Coherent and Normal Rate   Volume: Normal   Mood: fair  Affect: pleasant  Thought Process: Coherent, Logical and Loose   Orientation: Full   Thought Content: WDL   Suicidal Thoughts: No   Homicidal Thoughts: No    Judgement: Fair   Insight: Fair   Psychomotor Activity: Normal   Akathisia: No   Handed: Right   Memory: Immediate 3/3, recent 3/3   Concentration: Good   AIMS (if indicated): Not Indicated   Assets: Surveyor, mining  Housing  Social Support    Assessment:  AXIS I  Major Depressive Disorder, recurrent, moderate. GAD. Insomnia   Treatment Plan/Recommendations:  Plan of Care:  PLAN:  1. Affirm with the patient that the medications are taken as ordered. Patient  expressed understanding of how their medications were to be used.    Laboratory:  No labs warranted at this time.    Psychotherapy: Therapy: brief supportive therapy provided.  Discussed psychosocial stressors in detail and spent more then 50% of time  Medications:  Anxiety: not worse, zoloft helps, also takes buspar prn Call for refills when due Major depression: manageable, continue zoloft , prescription sent Insomnia: poor without trazadone. It helps, will continue Reviewed side effects Fu 3-4 m.  Time spent non face to face: 20mn  I discussed the assessment and treatment plan with the patient. The patient was provided an opportunity to ask questions and all were answered. The patient agreed with the plan and demonstrated an understanding of the instructions.   The patient was advised to call back or seek an in-person evaluation if the symptoms worsen or if the condition fails to improve as anticipated.            NMerian Capron MD  03/15/2020 3:36 PM

## 2020-04-02 ENCOUNTER — Other Ambulatory Visit (HOSPITAL_COMMUNITY): Payer: Self-pay | Admitting: Psychiatry

## 2020-04-19 ENCOUNTER — Encounter: Payer: Medicare Other | Admitting: Internal Medicine

## 2020-05-06 ENCOUNTER — Ambulatory Visit: Payer: Medicare Other | Admitting: Family Medicine

## 2020-05-20 ENCOUNTER — Ambulatory Visit: Payer: Medicare Other | Admitting: Family Medicine

## 2020-05-27 ENCOUNTER — Ambulatory Visit (INDEPENDENT_AMBULATORY_CARE_PROVIDER_SITE_OTHER): Payer: Medicare Other | Admitting: Family Medicine

## 2020-05-27 ENCOUNTER — Encounter: Payer: Self-pay | Admitting: Family Medicine

## 2020-05-27 ENCOUNTER — Other Ambulatory Visit: Payer: Self-pay

## 2020-05-27 VITALS — BP 118/65 | HR 68 | Ht 66.0 in | Wt 171.0 lb

## 2020-05-27 DIAGNOSIS — Z1211 Encounter for screening for malignant neoplasm of colon: Secondary | ICD-10-CM

## 2020-05-27 DIAGNOSIS — E119 Type 2 diabetes mellitus without complications: Secondary | ICD-10-CM

## 2020-05-27 DIAGNOSIS — M1712 Unilateral primary osteoarthritis, left knee: Secondary | ICD-10-CM

## 2020-05-27 DIAGNOSIS — M25562 Pain in left knee: Secondary | ICD-10-CM

## 2020-05-27 DIAGNOSIS — G8929 Other chronic pain: Secondary | ICD-10-CM

## 2020-05-27 DIAGNOSIS — D239 Other benign neoplasm of skin, unspecified: Secondary | ICD-10-CM | POA: Diagnosis not present

## 2020-05-27 LAB — POCT GLYCOSYLATED HEMOGLOBIN (HGB A1C): Hemoglobin A1C: 5.8 % — AB (ref 4.0–5.6)

## 2020-05-27 MED ORDER — NAPROXEN 500 MG PO TABS: 500.0000 mg | ORAL_TABLET | Freq: Two times a day (BID) | ORAL | 4 refills | Status: AC

## 2020-05-27 NOTE — Assessment & Plan Note (Signed)
Well controlled. Continue current regimen. Follow up in  4 mo 

## 2020-05-27 NOTE — Assessment & Plan Note (Signed)
He said he is really at the point that he needs a knee replacement on the left he is already had viscosupplementation done twice.  He just does not want to do it right now he says he is trying to push it out till next year but all possible but would like a prescription for naproxen.  He says it is helpful he also just tries to rest it when it is really bothering him.  He is already had the right knee replaced.

## 2020-05-27 NOTE — Progress Notes (Signed)
Established Patient Office Visit  Subjective:  Patient ID: Nathan [name], male    DOB: 07-Oct-1950  Age: 69 y.o. MRN: [id number]  CC:  Chief Complaint  Patient presents with  . Diabetes    HPI [name] presents for Diabetes - no hypoglycemic events. No wounds or sores that are not healing well. No increased thirst or urination. Checking glucose at home. Taking medications as prescribed without any side effects.  He did have both of his cataracts removed since I last saw him and is doing well.  He still wearing lenses because of the readers.  Also being followed for glaucoma.  Does have a lesion on his left arm he would like me to look at today he said it started with a bug bite about 3 months ago he says it took forever to heal and now that it is healed it left a pigmented spot.   Past Medical History:  Diagnosis Date  . Adenomatous colon polyp   . Allergy   . Anxiety   . CRPS (complex regional pain syndrome), upper limb   . Depression   . Diabetes mellitus without complication ([place])   . Diverticulitis 2013  . Diverticulosis   . Diverticulosis   . GERD (gastroesophageal reflux disease)   . Glaucoma   . History of Helicobacter pylori infection   . Hyperlipidemia   . Hypertension    no per pt  . Intestinal metaplasia of gastric mucosa   . Ligament tear of upper extremity April 2008   Left elbow  . Nephrolithiasis   . Renal calculi   . Renal disorder     Past Surgical History:  Procedure Laterality Date  . LITHOTRIPSY  2004   kidney stones  . REPLACEMENT TOTAL KNEE Right 2014  . ROTATOR CUFF REPAIR Left 2011  . ULNAR NERVE REPAIR Left 2009, 2010,   3 surgeries    Family History  Problem Relation Age of Onset  . Lung cancer Father   . Kidney disease Mother   . Colon cancer Neg Hx   . Esophageal cancer Neg Hx   . Rectal cancer Neg Hx   . Stomach cancer Neg Hx     Social History   Socioeconomic History  . Marital status: Married    Spouse  name: Not on file  . Number of children: 1  . Years of education: 20  . Highest education level: Bachelor's degree (e.g., BA, AB, BS)  Occupational History  . Occupation: AIRCRAFT INT [employer]    Employer: [employer]: Retired.   Tobacco Use  . Smoking status: Former Smoker    Packs/day: 1.00    Years: 10.00    Pack years: 10.00    Types: Cigarettes    Quit date: 09/25/1981    Years since quitting: 38.6  . Smokeless tobacco: Never Used  Vaping Use  . Vaping Use: Never used  Substance and Sexual Activity  . Alcohol use: Yes    Alcohol/week: 1.0 standard drink    Types: 1 Cans of beer per week    Comment: 1-2 per wk  . Drug use: No  . Sexual activity: Yes    Partners: Female  Other Topics Concern  . Not on file  Social History Narrative   Some exercise. Caffeine daily. Plays chess to keep brain stimulated   Social Determinants of Health   Financial Resource Strain:   . Difficulty of Paying Living Expenses: Not on file  Food Insecurity:   .  Worried About Charity fundraiser in the Last Year: Not on file  . Ran Out of Food in the Last Year: Not on file  Transportation Needs:   . Lack of Transportation (Medical): Not on file  . Lack of Transportation (Non-Medical): Not on file  Physical Activity:   . Days of Exercise per Week: Not on file  . Minutes of Exercise per Session: Not on file  Stress:   . Feeling of Stress : Not on file  Social Connections:   . Frequency of Communication with Friends and Family: Not on file  . Frequency of Social Gatherings with Friends and Family: Not on file  . Attends Religious Services: Not on file  . Active Member of Clubs or Organizations: Not on file  . Attends Archivist Meetings: Not on file  . Marital Status: Not on file  Intimate Partner Violence:   . Fear of Current or Ex-Partner: Not on file  . Emotionally Abused: Not on file  . Physically Abused: Not on file  . Sexually Abused: Not on file    Outpatient  Medications Prior to Visit  Medication Sig Dispense Refill  . atorvastatin (LIPITOR) 40 MG tablet Take 1 tablet (40 mg total) by mouth daily. 90 tablet 3  . Blood Glucose Monitoring Suppl (ONE TOUCH ULTRA SYSTEM KIT) w/Device KIT For testing blood sugars daily Dx: E11.9 1 each 0  . busPIRone (BUSPAR) 10 MG tablet Take 1 tablet (10 mg total) by mouth 2 (two) times daily. 60 tablet 2  . glucose blood (ONE TOUCH ULTRA TEST) test strip USE 1 STRIP TO CHECK GLUCOSE ONCE DAILY 100 each 0  . latanoprost (XALATAN) 0.005 % ophthalmic solution Place 1 drop into both eyes at bedtime.    [name] LUMIGAN 0.01 % SOLN     . OneTouch Delica Lancets 20U MISC 1 strip by Other route in the morning. 100 each PRN  . sertraline (ZOLOFT) 100 MG tablet Take 1 tablet (100 mg total) by mouth daily. 90 tablet 0  . traZODone (DESYREL) 50 MG tablet TAKE 1 TABLET BY MOUTH AT BEDTIME 90 tablet 0  . vitamin E 100 UNIT capsule Take by mouth daily.    . VOLTAREN 1 % GEL   5  . mupirocin ointment (BACTROBAN) 2 % Apply topically 2 (two) times daily as needed. Ap 30 g 0   No facility-administered medications prior to visit.    Allergies  Allergen Reactions  . Other Rash  . Adhesive [Tape]     Hives on area wear tape applied  . Hydrocodone-Acetaminophen Itching  . Hydrocodone Rash    ROS Review of Systems    Objective:    Physical Exam  BP 118/65   Pulse 68   Ht _0  (1.676 m)   Wt 171 lb (77.6 kg)   SpO2 99%   BMI 27.60 kg/m  Wt Readings from Last 3 Encounters:  05/27/20 171 lb (77.6 kg)  01/05/20 173 lb (78.5 kg)  09/05/19 168 lb (76.2 kg)     Health Maintenance Due  Topic Date Due  . COLONOSCOPY  02/01/2020    There are no preventive care reminders to display for this patient.  Lab Results  Component Value Date   TSH 2.253 12/27/2011   Lab Results  Component Value Date   WBC 8.6 03/24/2013   HGB 15.9 03/24/2013   HCT 47.1 03/24/2013   MCV 83.4 03/24/2013   PLT 204 03/24/2013   Lab Results   Component  Value Date   NA 141 01/12/2020   K 4.3 01/12/2020   CO2 30 01/12/2020   GLUCOSE 124 (H) 01/12/2020   BUN 21 01/12/2020   CREATININE 0.78 01/12/2020   BILITOT 0.9 01/12/2020   ALKPHOS 105 06/14/2016   AST 20 01/12/2020   ALT 21 01/12/2020   PROT 7.1 01/12/2020   ALBUMIN 4.1 06/14/2016   CALCIUM 9.4 01/12/2020   Lab Results  Component Value Date   CHOL 130 01/12/2020   Lab Results  Component Value Date   HDL 44 01/12/2020   Lab Results  Component Value Date   LDLCALC 61 01/12/2020   Lab Results  Component Value Date   TRIG 175 (H) 01/12/2020   Lab Results  Component Value Date   CHOLHDL 3.0 01/12/2020   Lab Results  Component Value Date   HGBA1C 5.8 (A) 05/27/2020      Assessment & Plan:   Problem List Items Addressed This Visit      Endocrine   Diabetes mellitus, type II ([place]) - Primary    Well controlled. Continue current regimen. Follow up in  4 mo      Relevant Orders   POCT glycosylated hemoglobin (Hb A1C) (Completed)     Musculoskeletal and Integument   Osteoarthritis of left knee    He said he is really at the point that he needs a knee replacement on the left he is already had viscosupplementation done twice.  He just does not want to do it right now he says he is trying to push it out till next year but all possible but would like a prescription for naproxen.  He says it is helpful he also just tries to rest it when it is really bothering him.  He is already had the right knee replaced.      Relevant Medications   naproxen (NAPROSYN) 500 MG tablet    Other Visit Diagnoses    Encounter for screening colonoscopy       Relevant Orders   Ambulatory referral to Gastroenterology   Chronic pain of left knee       Relevant Medications   naproxen (NAPROSYN) 500 MG tablet   Dermatofibroma         Referral placed for colonoscopy as well.   Neurofibroma-discussed diagnosis with him discussed that it will likely be a permanently pigmented  papule.  That if they are benign.  Meds ordered this encounter  Medications  . naproxen (NAPROSYN) 500 MG tablet    Sig: Take 1 tablet (500 mg total) by mouth 2 (two) times daily with a meal. PRN.    Dispense:  60 tablet    Refill:  4    Follow-up: Return in about 4 months (around 09/26/2020) for Diabetes follow-up.    Beatrice Lecher, MD

## 2020-07-05 ENCOUNTER — Other Ambulatory Visit (HOSPITAL_COMMUNITY): Payer: Self-pay | Admitting: Psychiatry

## 2020-07-12 ENCOUNTER — Encounter (HOSPITAL_COMMUNITY): Payer: Self-pay | Admitting: Psychiatry

## 2020-07-12 ENCOUNTER — Telehealth (INDEPENDENT_AMBULATORY_CARE_PROVIDER_SITE_OTHER): Payer: Medicare Other | Admitting: Psychiatry

## 2020-07-12 DIAGNOSIS — F411 Generalized anxiety disorder: Secondary | ICD-10-CM | POA: Diagnosis not present

## 2020-07-12 DIAGNOSIS — F5102 Adjustment insomnia: Secondary | ICD-10-CM

## 2020-07-12 DIAGNOSIS — F331 Major depressive disorder, recurrent, moderate: Secondary | ICD-10-CM

## 2020-07-12 MED ORDER — SERTRALINE HCL 100 MG PO TABS: 150.0000 mg | ORAL_TABLET | Freq: Every day | ORAL | 0 refills | Status: DC

## 2020-07-12 NOTE — Progress Notes (Signed)
Patient ID: Nathan [name], male   DOB: 12-03-1950, 69 y.o.   MRN: [id number]   [place] Follow-up Outpatient Visit  Nathan [name] 06-30-1951  Date: 07/12/2020        I connected with Nathan [name] on 07/12/20 at  4:00 PM EDT by telephone and verified that I am speaking with the correct person using two identifiers. I discussed the limitations, risks, security and privacy concerns of performing an evaluation and management service by telephone and the availability of in person appointments. I also discussed with the patient that there may be a patient responsible charge related to this service. The patient expressed understanding and agreed to proceed.  Patient location: home Provider location : home office  HPI Comments: Nathan [name] is a 69  y/o male with a past psychiatric history significant for Major Depressive Disorder. GAD, insomnia.  The patient returns for psychiatric services for medication management.    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 Duration: post retirement.  Support: son , wife Severity ; anxious   Aggravating factor Finances. Step daughter in past, finances current . Duration: 13 years       Review of Systems  Cardiovascular: Negative for chest pain.  Psychiatric/Behavioral: Negative for depression. The patient is nervous/anxious.       There were no vitals filed for this visit. Physical Exam        Past Medical History: Reviewed  Past Medical History:  Diagnosis Date  . Adenomatous colon polyp   . Allergy   . Anxiety   . CRPS (complex regional pain syndrome), upper limb   . Depression   . Diabetes mellitus without complication ([place])   . Diverticulitis 2013  . Diverticulosis   . Diverticulosis   . GERD (gastroesophageal reflux disease)   . Glaucoma   . History of Helicobacter pylori infection   .  Hyperlipidemia   . Hypertension    no per pt  . Intestinal metaplasia of gastric mucosa   . Ligament tear of upper extremity April 2008   Left elbow  . Nephrolithiasis   . Renal calculi   . Renal disorder       Allergies: Reviewed  Allergies  Allergen Reactions  . Other Rash  . Adhesive [Tape]     Hives on area wear tape applied  . Hydrocodone-Acetaminophen Itching  . Hydrocodone Rash   Current Medications: Reviewed  Current Outpatient Medications on File Prior to Visit  Medication Sig Dispense Refill  . atorvastatin (LIPITOR) 40 MG tablet Take 1 tablet (40 mg total) by mouth daily. 90 tablet 3  . Blood Glucose Monitoring Suppl (ONE TOUCH ULTRA SYSTEM KIT) w/Device KIT For testing blood sugars daily Dx: E11.9 1 each 0  . busPIRone (BUSPAR) 10 MG tablet Take 1 tablet (10 mg total) by mouth 2 (two) times daily. 60 tablet 2  . glucose blood (ONE TOUCH ULTRA TEST) test strip USE 1 STRIP TO CHECK GLUCOSE ONCE DAILY 100 each 0  . latanoprost (XALATAN) 0.005 % ophthalmic solution Place 1 drop into both eyes at bedtime.    [name] LUMIGAN 0.01 % SOLN     . naproxen (NAPROSYN) 500 MG tablet Take 1 tablet (500 mg total) by mouth 2 (two) times daily with a meal. PRN. 60 tablet 4  . OneTouch Delica Lancets 03E MISC 1 strip by Other route in the morning. 100 each PRN  .  traZODone (DESYREL) 50 MG tablet TAKE 1 TABLET BY MOUTH AT BEDTIME 90 tablet 0  . vitamin E 100 UNIT capsule Take by mouth daily.    . VOLTAREN 1 % GEL   5   No current facility-administered medications on file prior to visit.      Family History: Reviewed  Family History  Problem Relation Age of Onset  . Lung cancer Father   . Kidney disease Mother   . Colon cancer Neg Hx   . Esophageal cancer Neg Hx   . Rectal cancer Neg Hx   . Stomach cancer Neg Hx     Psychiatric specialty examination:  Objective: Appearance:   Eye Contact::   Speech: Clear and Coherent and Normal Rate   Volume: Normal   Mood: stressed   Affect: pleasant  Thought Process: Coherent, Logical and Loose   Orientation: Full   Thought Content: WDL   Suicidal Thoughts: No   Homicidal Thoughts: No   Judgement: Fair   Insight: Fair   Psychomotor Activity: Normal   Akathisia: No   Handed: Right   Memory: Immediate 3/3, recent 3/3   Concentration: Good   AIMS (if indicated): Not Indicated   Assets: Surveyor, mining  Housing  Social Support    Assessment:  AXIS I  Major Depressive Disorder, recurrent, moderate. GAD. Insomnia   Treatment Plan/Recommendations:  Plan of Care:  PLAN:  1. Affirm with the patient that the medications are taken as ordered. Patient  expressed understanding of how their medications were to be used.    Laboratory:  No labs warranted at this time.    Psychotherapy: Therapy: brief supportive therapy provided.  Discussed psychosocial stressors in detail and spent more then 50% of time  Medications:  Anxiety: worriful, feels anxiety is worse. Increase zoloft to 135m, have to call for refill once used up his 10936mtablets. Major depression: subdued but not worse, continue zoloft,  Insomnia: manageable with trazadone , does not take full dose Fu 2-36m55mme spent non face to face: 84m56mI discussed the assessment and treatment plan with the patient. The patient was provided an opportunity to ask questions and all were answered. The patient agreed with the plan and demonstrated an understanding of the instructions.   The patient was advised to call back or seek an in-person evaluation if the symptoms worsen or if the condition fails to improve as anticipated.            NADEMerian [name]  07/12/2020 4:12 PM

## 2020-07-28 ENCOUNTER — Other Ambulatory Visit (HOSPITAL_COMMUNITY): Payer: Self-pay | Admitting: Psychiatry

## 2020-09-28 ENCOUNTER — Other Ambulatory Visit (HOSPITAL_COMMUNITY): Payer: Self-pay | Admitting: Psychiatry

## 2020-10-01 ENCOUNTER — Ambulatory Visit (INDEPENDENT_AMBULATORY_CARE_PROVIDER_SITE_OTHER): Payer: Medicare Other | Admitting: Family Medicine

## 2020-10-01 ENCOUNTER — Other Ambulatory Visit: Payer: Self-pay

## 2020-10-01 ENCOUNTER — Encounter: Payer: Self-pay | Admitting: Family Medicine

## 2020-10-01 VITALS — BP 131/66 | HR 72 | Ht 68.0 in | Wt 161.6 lb

## 2020-10-01 DIAGNOSIS — F3341 Major depressive disorder, recurrent, in partial remission: Secondary | ICD-10-CM | POA: Diagnosis not present

## 2020-10-01 DIAGNOSIS — Z125 Encounter for screening for malignant neoplasm of prostate: Secondary | ICD-10-CM | POA: Diagnosis not present

## 2020-10-01 DIAGNOSIS — E119 Type 2 diabetes mellitus without complications: Secondary | ICD-10-CM | POA: Diagnosis not present

## 2020-10-01 DIAGNOSIS — E785 Hyperlipidemia, unspecified: Secondary | ICD-10-CM

## 2020-10-01 LAB — POCT GLYCOSYLATED HEMOGLOBIN (HGB A1C): Hemoglobin A1C: 5.8 % — AB (ref 4.0–5.6)

## 2020-10-01 LAB — POCT UA - MICROALBUMIN
Creatinine, POC: 100 mg/dL
Microalbumin Ur, POC: 80 mg/L

## 2020-10-01 MED ORDER — ATORVASTATIN CALCIUM 40 MG PO TABS: 40.0000 mg | ORAL_TABLET | Freq: Every day | ORAL | 3 refills | Status: DC

## 2020-10-01 NOTE — Assessment & Plan Note (Signed)
Controlled off of metformin in fact at this point [facility] let him go every 6 months for follow-up.

## 2020-10-01 NOTE — Progress Notes (Signed)
Established Patient Office Visit  Subjective:  Patient ID: Nathan [name], male    DOB: [birth year]  Age: 70 y.o. MRN: [id number]  CC:  Chief Complaint  Patient presents with  . Diabetes    HPI [name] presents for   Diabetes - no hypoglycemic events. No wounds or sores that are not healing well. No increased thirst or urination. Checking glucose at home. He is actually been doing really well off his metformin he has been really trying to make good dietary choices and get regular exercise. Says he recently ran out of his statin and just quit taking it.   Past Medical History:  Diagnosis Date  . Adenomatous colon polyp   . Allergy   . Anxiety   . CRPS (complex regional pain syndrome), upper limb   . Depression   . Diabetes mellitus without complication ([place])   . Diverticulitis 2013  . Diverticulosis   . Diverticulosis   . GERD (gastroesophageal reflux disease)   . Glaucoma   . History of Helicobacter pylori infection   . Hyperlipidemia   . Hypertension    no per pt  . Intestinal metaplasia of gastric mucosa   . Ligament tear of upper extremity April 2008   Left elbow  . Nephrolithiasis   . Renal calculi   . Renal disorder     Past Surgical History:  Procedure Laterality Date  . LITHOTRIPSY  2004   kidney stones  . REPLACEMENT TOTAL KNEE Right 2014  . ROTATOR CUFF REPAIR Left 2011  . ULNAR NERVE REPAIR Left 2009, 2010,   3 surgeries    Family History  Problem Relation Age of Onset  . Lung cancer Father   . Kidney disease Mother   . Colon cancer Neg Hx   . Esophageal cancer Neg Hx   . Rectal cancer Neg Hx   . Stomach cancer Neg Hx     Social History   Socioeconomic History  . Marital status: Married    Spouse name: Not on file  . Number of children: 1  . Years of education: 40  . Highest education level: Bachelor's degree (e.g., BA, AB, BS)  Occupational History  . Occupation: AIRCRAFT INT [employer]    Employer: [employer]:  Retired.   Tobacco Use  . Smoking status: Former Smoker    Packs/day: 1.00    Years: 10.00    Pack years: 10.00    Types: Cigarettes    Quit date: 09/25/1981    Years since quitting: 39.0  . Smokeless tobacco: Never Used  Vaping Use  . Vaping Use: Never used  Substance and Sexual Activity  . Alcohol use: Yes    Alcohol/week: 1.0 standard drink    Types: 1 Cans of beer per week    Comment: 1-2 per wk  . Drug use: No  . Sexual activity: Yes    Partners: Female  Other Topics Concern  . Not on file  Social History Narrative   Some exercise. Caffeine daily. Plays chess to keep brain stimulated   Social Determinants of Health   Financial Resource Strain: Not on file  Food Insecurity: Not on file  Transportation Needs: Not on file  Physical Activity: Not on file  Stress: Not on file  Social Connections: Not on file  Intimate Partner Violence: Not on file    Outpatient Medications Prior to Visit  Medication Sig Dispense Refill  . Blood Glucose Monitoring Suppl (ONE TOUCH ULTRA SYSTEM KIT) w/Device KIT For  testing blood sugars daily Dx: E11.9 1 each 0  . busPIRone (BUSPAR) 10 MG tablet Take 1 tablet (10 mg total) by mouth 2 (two) times daily. 60 tablet 2  . glucose blood (ONE TOUCH ULTRA TEST) test strip USE 1 STRIP TO CHECK GLUCOSE ONCE DAILY 100 each 0  . naproxen (NAPROSYN) 500 MG tablet Take 1 tablet (500 mg total) by mouth 2 (two) times daily with a meal. PRN. 60 tablet 4  . OneTouch Delica Lancets 13Y MISC 1 strip by Other route in the morning. 100 each PRN  . sertraline (ZOLOFT) 100 MG tablet Take 1 tablet by mouth once daily 90 tablet 0  . traZODone (DESYREL) 50 MG tablet TAKE 1 TABLET BY MOUTH AT BEDTIME 90 tablet 0  . vitamin E 100 UNIT capsule Take by mouth daily.    . VOLTAREN 1 % GEL   5  . atorvastatin (LIPITOR) 40 MG tablet Take 1 tablet (40 mg total) by mouth daily. 90 tablet 3  . latanoprost (XALATAN) 0.005 % ophthalmic solution Place 1 drop into both eyes at  bedtime.    [name] LUMIGAN 0.01 % SOLN      No facility-administered medications prior to visit.    Allergies  Allergen Reactions  . Other Rash  . Adhesive [Tape]     Hives on area wear tape applied  . Hydrocodone-Acetaminophen Itching  . Hydrocodone Rash    ROS Review of Systems    Objective:    Physical Exam Constitutional:      Appearance: He is well-developed and well-nourished.  HENT:     Head: Normocephalic and atraumatic.  Cardiovascular:     Rate and Rhythm: Normal rate and regular rhythm.     Heart sounds: Normal heart sounds.  Pulmonary:     Effort: Pulmonary effort is normal.     Breath sounds: Normal breath sounds.  Skin:    General: Skin is warm and dry.  Neurological:     Mental Status: He is alert and oriented to person, place, and time.  Psychiatric:        Mood and Affect: Mood and affect normal.        Behavior: Behavior normal.     BP 131/66   Pulse 72   Ht '5\' 8"'  (1.727 m)   Wt 161 lb 9.6 oz (73.3 kg)   SpO2 98%   BMI 24.57 kg/m  Wt Readings from Last 3 Encounters:  10/01/20 161 lb 9.6 oz (73.3 kg)  05/27/20 171 lb (77.6 kg)  01/05/20 173 lb (78.5 kg)     There are no preventive care reminders to display for this patient.  There are no preventive care reminders to display for this patient.  Lab Results  Component Value Date   TSH 2.253 12/27/2011   Lab Results  Component Value Date   WBC 8.6 03/24/2013   HGB 15.9 03/24/2013   HCT 47.1 03/24/2013   MCV 83.4 03/24/2013   PLT 204 03/24/2013   Lab Results  Component Value Date   NA 141 01/12/2020   K 4.3 01/12/2020   CO2 30 01/12/2020   GLUCOSE 124 (H) 01/12/2020   BUN 21 01/12/2020   CREATININE 0.78 01/12/2020   BILITOT 0.9 01/12/2020   ALKPHOS 105 06/14/2016   AST 20 01/12/2020   ALT 21 01/12/2020   PROT 7.1 01/12/2020   ALBUMIN 4.1 06/14/2016   CALCIUM 9.4 01/12/2020   Lab Results  Component Value Date   CHOL 130 01/12/2020   Lab  Results  Component Value Date    HDL 44 01/12/2020   Lab Results  Component Value Date   LDLCALC 61 01/12/2020   Lab Results  Component Value Date   TRIG 175 (H) 01/12/2020   Lab Results  Component Value Date   CHOLHDL 3.0 01/12/2020   Lab Results  Component Value Date   HGBA1C 5.8 (A) 10/01/2020      Assessment & Plan:   Problem List Items Addressed This Visit      Endocrine   Diabetes mellitus, type II ([place]) - Primary    Controlled off of metformin in fact at this point [facility] let him go every 6 months for follow-up.      Relevant Medications   atorvastatin (LIPITOR) 40 MG tablet   Other Relevant Orders   POCT glycosylated hemoglobin (Hb A1C) (Completed)   POCT UA - Microalbumin (Completed)   COMPLETE METABOLIC PANEL WITH GFR   Lipid panel   PSA     Other   Major depressive disorder, recurrent ([place])    Doing well on his BuSpar and zoloft. Follows with Dr. Nicole Cella. . Continue current regimen. Follow-up in 6 months.      Hyperlipidemia    Did discuss the importance of taking a statin to reduce his cardiovascular risk especially with diagnosis of diabetes. So he did agree to go ahead and restart the medication. New prescription sent to pharmacy. Plan to follow-up in 1 year.      Relevant Medications   atorvastatin (LIPITOR) 40 MG tablet   Other Relevant Orders   Lipid panel    Other Visit Diagnoses    Screening for prostate cancer       Relevant Orders   PSA      Meds ordered this encounter  Medications  . atorvastatin (LIPITOR) 40 MG tablet    Sig: Take 1 tablet (40 mg total) by mouth daily.    Dispense:  90 tablet    Refill:  3    Follow-up: Return in about 6 months (around 03/31/2021) for Diabetes follow-up.    Beatrice Lecher, MD

## 2020-10-01 NOTE — Assessment & Plan Note (Signed)
Did discuss the importance of taking a statin to reduce his cardiovascular risk especially with diagnosis of diabetes. So he did agree to go ahead and restart the medication. New prescription sent to pharmacy. Plan to follow-up in 1 year.

## 2020-10-01 NOTE — Assessment & Plan Note (Addendum)
Doing well on his BuSpar and zoloft. Follows with Dr. Nicole Cella. . Continue current regimen. Follow-up in 6 months.

## 2020-10-12 ENCOUNTER — Telehealth (HOSPITAL_COMMUNITY): Payer: Medicare Other | Admitting: Psychiatry

## 2020-10-14 DIAGNOSIS — H401132 Primary open-angle glaucoma, bilateral, moderate stage: Secondary | ICD-10-CM | POA: Diagnosis not present

## 2020-10-19 ENCOUNTER — Telehealth (INDEPENDENT_AMBULATORY_CARE_PROVIDER_SITE_OTHER): Payer: Medicare Other | Admitting: Psychiatry

## 2020-10-19 ENCOUNTER — Encounter (HOSPITAL_COMMUNITY): Payer: Self-pay | Admitting: Psychiatry

## 2020-10-19 DIAGNOSIS — F331 Major depressive disorder, recurrent, moderate: Secondary | ICD-10-CM | POA: Diagnosis not present

## 2020-10-19 DIAGNOSIS — F411 Generalized anxiety disorder: Secondary | ICD-10-CM | POA: Diagnosis not present

## 2020-10-19 DIAGNOSIS — F5102 Adjustment insomnia: Secondary | ICD-10-CM | POA: Diagnosis not present

## 2020-10-19 MED ORDER — BUSPIRONE HCL 10 MG PO TABS
10.0000 mg | ORAL_TABLET | Freq: Every day | ORAL | 1 refills | Status: DC
Start: 2020-10-19 — End: 2021-04-29

## 2020-10-19 NOTE — Progress Notes (Signed)
Patient ID: Josiel Gahm, male   DOB: April 03, 1951, 70 y.o.   MRN: [id number]   [facility] Follow-up Outpatient Visit  Eliseo Withers Jun 14, 1951  Date:10/19/2020    Virtual Visit via Telephone Note  I connected with [facility] on 10/19/20 at  4:00 PM EST by telephone and verified that I am speaking with the correct person using two identifiers.  Location: Patient: home  Provider: home office   I discussed the limitations, risks, security and privacy concerns of performing an evaluation and management service by telephone and the availability of in person appointments. I also discussed with the patient that there may be a patient responsible charge related to this service. The patient expressed understanding and agreed to proceed.    I discussed the assessment and treatment plan with the patient. The patient was provided an opportunity to ask questions and all were answered. The patient agreed with the plan and demonstrated an understanding of the instructions.   The patient was advised to call back or seek an in-person evaluation if the symptoms worsen or if the condition fails to improve as anticipated.  I provided 11 minutes of non-face-to-face time during this encounter.   [name], MD   HPI Comments: Mr. Creason is a 70 y/o male with a past psychiatric history significant for Major Depressive Disorder. GAD, insomnia.  The patient returns for psychiatric services for medication management.   Doing fair, increase zoloft to 154m has hellped Sleep fair  Stress level manageable buspar helps anxiety   Wife and kids are doing well. Duration: post retirement.  Support: son , wife,  Severity ; not worse,     Aggravating factor finances . Duration:12 years       Review of Systems  Cardiovascular: Negative for chest pain.  Psychiatric/Behavioral: Negative for depression.      There were no vitals filed for this visit. Physical Exam         Past Medical History: Reviewed  Past Medical History:  Diagnosis Date  . Adenomatous colon polyp   . Allergy   . Anxiety   . CRPS (complex regional pain syndrome), upper limb   . Depression   . Diabetes mellitus [place] complication ([place]   . Diverticulitis 2013  . Diverticulosis   . Diverticulosis   . GERD (gastroesophageal reflux disease)   . Glaucoma   . History of Helicobacter pylori infection   . Hyperlipidemia   . Hypertension    no per pt  . Intestinal metaplasia of gastric mucosa   . Ligament tear of upper extremity April 2008   Left elbow  . Nephrolithiasis   . Renal calculi   . Renal disorder       Allergies: Reviewed  Allergies  Allergen Reactions  . Other Rash  . Adhesive [Tape]     Hives on area wear tape applied  . Hydrocodone-Acetaminophen Itching  . Hydrocodone Rash   Current Medications: Reviewed  Current Outpatient Medications on File Prior to Visit  Medication Sig Dispense Refill  . atorvastatin (LIPITOR) 40 MG tablet Take 1 tablet (40 mg total) by mouth daily. 90 tablet 3  . Blood Glucose Monitoring Suppl (ONE TOUCH ULTRA SYSTEM KIT) w/Device KIT For testing blood sugars daily Dx: E11.9 1 each 0  . glucose blood (ONE TOUCH ULTRA TEST) test strip USE 1 STRIP TO CHECK GLUCOSE ONCE DAILY 100 each 0  . naproxen (NAPROSYN) 500 MG tablet Take 1 tablet (500 mg total) by mouth 2 (two) times daily with  a meal. PRN. 60 tablet 4  . OneTouch Delica Lancets 65H MISC 1 strip by Other route in the morning. 100 each PRN  . sertraline (ZOLOFT) 100 MG tablet Take 1 tablet by mouth once daily 90 tablet 0  . traZODone (DESYREL) 50 MG tablet TAKE 1 TABLET BY MOUTH AT BEDTIME 90 tablet 0  . vitamin E 100 UNIT capsule Take by mouth daily.    . VOLTAREN 1 % GEL   5   No current facility-administered medications on file prior to visit.      Family History: Reviewed  Family History  Problem Relation Age of Onset  . Lung cancer Father   . Kidney  disease Mother   . Colon cancer Neg Hx   . Esophageal cancer Neg Hx   . Rectal cancer Neg Hx   . Stomach cancer Neg Hx     Psychiatric specialty examination:  Objective: Appearance:   Eye Contact::   Speech: Clear and Coherent and Normal Rate   Volume: Normal   Mood: fair  Affect: pleasant  Thought Process: Coherent, Logical and Loose   Orientation: Full   Thought Content: WDL   Suicidal Thoughts: No   Homicidal Thoughts: No   Judgement: Fair   Insight: Fair   Psychomotor Activity: Normal   Akathisia: No   Handed: Right   Memory: Immediate 3/3, recent 3/3   Concentration: Good   AIMS (if indicated): Not Indicated   Assets: Surveyor, mining  Housing  Social Support   Prior documentation reviewed  Assessment:  AXIS I  Major Depressive Disorder, recurrent, moderate. GAD. Insomnia   Treatment Plan/Recommendations:  Plan of Care:  PLAN:  1. Affirm with the patient that the medications are taken as ordered. Patient  expressed understanding of how their medications were to be used.    Laboratory:  No labs warranted at this time.    Psychotherapy: Therapy: brief supportive therapy provided.  Discussed psychosocial stressors in detail and spent more then 50% of time  Medications:  Anxiety: doing fair on zoloft 195m And buspar he seldom takes upto one a day  Major depression: doing better, continue zoloft Insomnia:manageable, continue trazadone prn fU 3 - 4 m.            NMerian Capron MD  10/19/2020 4:16 PM

## 2020-12-28 ENCOUNTER — Other Ambulatory Visit (HOSPITAL_COMMUNITY): Payer: Self-pay | Admitting: Psychiatry

## 2021-01-06 ENCOUNTER — Encounter: Payer: Self-pay | Admitting: Family Medicine

## 2021-01-11 ENCOUNTER — Ambulatory Visit (INDEPENDENT_AMBULATORY_CARE_PROVIDER_SITE_OTHER): Payer: Medicare Other | Admitting: Sports Medicine

## 2021-01-11 ENCOUNTER — Ambulatory Visit: Payer: Medicare Other

## 2021-01-11 ENCOUNTER — Telehealth: Payer: Self-pay

## 2021-01-11 ENCOUNTER — Other Ambulatory Visit: Payer: Self-pay

## 2021-01-11 DIAGNOSIS — E785 Hyperlipidemia, unspecified: Secondary | ICD-10-CM

## 2021-01-11 DIAGNOSIS — Z125 Encounter for screening for malignant neoplasm of prostate: Secondary | ICD-10-CM

## 2021-01-11 DIAGNOSIS — M67911 Unspecified disorder of synovium and tendon, right shoulder: Secondary | ICD-10-CM

## 2021-01-11 DIAGNOSIS — E119 Type 2 diabetes mellitus without complications: Secondary | ICD-10-CM

## 2021-01-11 DIAGNOSIS — M67912 Unspecified disorder of synovium and tendon, left shoulder: Secondary | ICD-10-CM | POA: Diagnosis not present

## 2021-01-11 MED ORDER — MELOXICAM 15 MG PO TABS: ORAL_TABLET | ORAL | 3 refills | Status: DC

## 2021-01-11 NOTE — Telephone Encounter (Signed)
Patient has an appt with you in July 2022 for a 58m recheck. Saw you last in Jan 2022 and had: PSA, Lipid, CMP, POCT A1C, and POCT Microalbumin. Patient would like lab orders to have them done prior to his July appt. Please advise.

## 2021-01-11 NOTE — Telephone Encounter (Signed)
Okay to order as below except for just use microalbumin is at a point of care.

## 2021-01-11 NOTE — Assessment & Plan Note (Signed)
This is a very pleasant 70 year old male, he has a long history of bilateral shoulder pain, he does have a history of a rotator cuff tear on the left post arthroscopic repair, he also had a rotator cuff tear on the right that was rehabbed with Dr. Berenice Primas. Unfortunately he is having recurrence of right worse than left bilateral shoulder pain with impingement signs on the right, bicipital signs on the right, as well as mostly bicipital signs on the left. We will start conservatively, meloxicam, bilateral x-rays, formal PT, we explained the force coupling function of the rotator cuff, return to see me in 6 weeks, injection if no better

## 2021-01-11 NOTE — Telephone Encounter (Signed)
Labs ordered.

## 2021-01-11 NOTE — Progress Notes (Signed)
    Procedures performed today:    None.  Independent interpretation of notes and tests performed by another provider:   None.  Brief History, Exam, Impression, and Recommendations:    Bilateral rotator cuff dysfunction This is a very pleasant 70 year old male, he has a long history of bilateral shoulder pain, he does have a history of a rotator cuff tear on the left post arthroscopic repair, he also had a rotator cuff tear on the right that was rehabbed with Dr. Berenice Primas. Unfortunately he is having recurrence of right worse than left bilateral shoulder pain with impingement signs on the right, bicipital signs on the right, as well as mostly bicipital signs on the left. We will start conservatively, meloxicam, bilateral x-rays, formal PT, we explained the force coupling function of the rotator cuff, return to see me in 6 weeks, injection if no better    ___________________________________________ Gwen Her. Dianah Field, M.D., ABFM., CAQSM. Primary Care and [place] Instructor of [place] [employer]

## 2021-01-14 ENCOUNTER — Other Ambulatory Visit: Payer: Self-pay

## 2021-01-14 ENCOUNTER — Ambulatory Visit (INDEPENDENT_AMBULATORY_CARE_PROVIDER_SITE_OTHER): Payer: Medicare Other

## 2021-01-14 ENCOUNTER — Encounter: Payer: Self-pay | Admitting: Physical Therapy

## 2021-01-14 ENCOUNTER — Ambulatory Visit (INDEPENDENT_AMBULATORY_CARE_PROVIDER_SITE_OTHER): Payer: Medicare Other | Admitting: Physical Therapy

## 2021-01-14 DIAGNOSIS — M67911 Unspecified disorder of synovium and tendon, right shoulder: Secondary | ICD-10-CM

## 2021-01-14 DIAGNOSIS — R600 Localized edema: Secondary | ICD-10-CM | POA: Diagnosis not present

## 2021-01-14 DIAGNOSIS — M25512 Pain in left shoulder: Secondary | ICD-10-CM

## 2021-01-14 DIAGNOSIS — G8929 Other chronic pain: Secondary | ICD-10-CM | POA: Diagnosis not present

## 2021-01-14 DIAGNOSIS — R293 Abnormal posture: Secondary | ICD-10-CM

## 2021-01-14 DIAGNOSIS — M25712 Osteophyte, left shoulder: Secondary | ICD-10-CM | POA: Diagnosis not present

## 2021-01-14 DIAGNOSIS — M67912 Unspecified disorder of synovium and tendon, left shoulder: Secondary | ICD-10-CM

## 2021-01-14 DIAGNOSIS — M6281 Muscle weakness (generalized): Secondary | ICD-10-CM | POA: Diagnosis not present

## 2021-01-14 DIAGNOSIS — M25511 Pain in right shoulder: Secondary | ICD-10-CM

## 2021-01-14 DIAGNOSIS — M19012 Primary osteoarthritis, left shoulder: Secondary | ICD-10-CM | POA: Diagnosis not present

## 2021-01-14 DIAGNOSIS — M25711 Osteophyte, right shoulder: Secondary | ICD-10-CM | POA: Diagnosis not present

## 2021-01-14 DIAGNOSIS — M19011 Primary osteoarthritis, right shoulder: Secondary | ICD-10-CM | POA: Diagnosis not present

## 2021-01-14 NOTE — Patient Instructions (Signed)
Access Code: [email or web address] URL: [email or web address] Date: 01/14/2021 Prepared by: Isabelle Course  Exercises Seated Shoulder Flexion Towel Slide at Table Top - 1 x daily - 7 x weekly - 3 sets - 10 reps Seated Shoulder Abduction Towel Slide at Table Top - 1 x daily - 7 x weekly - 3 sets - 10 reps Seated Scapular Retraction - 1 x daily - 7 x weekly - 2 sets - 10 reps - 3-5 seconds hold

## 2021-01-14 NOTE — Therapy (Signed)
[place], [place], [address] Phone: [phone]   Fax:  [phone]  Physical Therapy Evaluation  Patient Details  Name: Nathan [name] MRN: [id number] Date of Birth: 06-26-1951 Referring Provider (PT): thekkekandam   Encounter Date: 01/14/2021   PT End of Session - 01/14/21 1455    Visit Number 1    Number of Visits 12    Date for PT Re-Evaluation 02/25/21    PT Start Time 3875    PT Stop Time 1500    PT Time Calculation (min) 45 min    Activity Tolerance Patient tolerated treatment well    Behavior During Therapy [facility] for tasks assessed/performed           Past Medical History:  Diagnosis Date  . Adenomatous colon polyp   . Allergy   . Anxiety   . CRPS (complex regional pain syndrome), upper limb   . Depression   . Diabetes mellitus without complication ([place])   . Diverticulitis 2013  . Diverticulosis   . Diverticulosis   . GERD (gastroesophageal reflux disease)   . Glaucoma   . History of Helicobacter pylori infection   . Hyperlipidemia   . Hypertension    no per pt  . Intestinal metaplasia of gastric mucosa   . Ligament tear of upper extremity April 2008   Left elbow  . Nephrolithiasis   . Renal calculi   . Renal disorder     Past Surgical History:  Procedure Laterality Date  . LITHOTRIPSY  2004   kidney stones  . REPLACEMENT TOTAL KNEE Right 2014  . ROTATOR CUFF REPAIR Left 2011  . ULNAR NERVE REPAIR Left 2009, 2010,   3 surgeries    There were no vitals filed for this visit.    Subjective Assessment - 01/14/21 1422    Subjective Pt had Left rotator repair about 10 years ago. Pt fell about 2 years ago and injured Rt shoulder and was told he needed surgery but has not had the surgery. Pt states Rt shoulder pain has been worsening for the past month.    Pertinent History Lt rotator cuff repair about 10 years ago    Limitations Lifting;House hold activities     Diagnostic tests Rt rotator cuff tear found years ago    Patient Stated Goals decrease pain with IADLs    Currently in Pain? Yes    Pain Score 6     Pain Location Shoulder    Pain Orientation Right    Pain Descriptors / Indicators Sharp    Pain Type Chronic pain    Pain Onset More than a month ago    Pain Frequency Intermittent    Aggravating Factors  sleeping on Rt side, lifting Rt UE above head, lifting with Rt UE    Pain Relieving Factors tylenol              [facility] PT Assessment - 01/14/21 0001      Assessment   Medical Diagnosis bilateral rotator cuff dysfunction    Referring Provider (PT) thekkekandam      Precautions   Precautions None      Balance Screen   Has the patient fallen in the past 6 months No      Prior Function   Level of Independence Independent      Observation/Other Assessments   Focus on Therapeutic Outcomes (FOTO)  49 functional status measure      Posture/Postural Control   Posture Comments rounded  shoulders      ROM / Strength   AROM / PROM / Strength AROM;Strength      AROM   AROM Assessment Site Shoulder    Right/Left Shoulder Right;Left    Right Shoulder Flexion 152 Degrees    Right Shoulder ABduction 85 Degrees    Right Shoulder Internal Rotation 80 Degrees    Right Shoulder External Rotation 24 Degrees    Left Shoulder Flexion 162 Degrees    Left Shoulder ABduction 121 Degrees    Left Shoulder Internal Rotation 80 Degrees    Left Shoulder External Rotation 47 Degrees      Strength   Strength Assessment Site Shoulder    Right/Left Shoulder Right;Left    Right Shoulder Flexion 3-/5    Right Shoulder ABduction 3/5    Left Shoulder Flexion 3/5    Left Shoulder ABduction 3+/5      Palpation   Palpation comment Rt shoulder TTP anterior and superior shoulder, unable to tolerate light pressure, Lt shoulder without hypersensitivity, hypomobilie GH jt      Special Tests    Special Tests Rotator Cuff Impingement    Rotator Cuff  Impingment tests Michel Bickers test;Neer impingement test      Neer Impingement test    Findings Positive    Side Right      Hawkins-Kennedy test   Findings Positive    Side Right                      Objective measurements completed on examination: See above findings.       [facility] Adult PT Treatment/Exercise - 01/14/21 0001      Exercises   Exercises Shoulder      Shoulder Exercises: Seated   Retraction 10 reps      Shoulder Exercises: Stretch   Table Stretch - Flexion 5 reps;10 seconds    Table Stretch - Abduction 5 reps;10 seconds      Modalities   Modalities Vasopneumatic      Vasopneumatic   Number Minutes Vasopneumatic  10 minutes    Vasopnuematic Location  Shoulder    Vasopneumatic Pressure Low    Vasopneumatic Temperature  34                  PT Education - 01/14/21 1452    Education Details HEP, PT POC and goals    Person(s) Educated Patient    Methods Explanation;Demonstration;Handout    Comprehension Verbalized understanding;Returned demonstration               PT Long Term Goals - 01/14/21 1459      PT LONG TERM GOAL #1   Title Pt will be independent in HEP    Time 6    Period Weeks    Status New    Target Date 02/25/21      PT LONG TERM GOAL #2   Title Pt will improve bilat shoulder strength to 4/5 to perform reaching overhead with decreased pain    Time 6    Period Weeks    Status New    Target Date 02/25/21      PT LONG TERM GOAL #3   Title Pt will improve bilat shoulder ROM by 10 degrees to perform IADLs with decreased pain    Time 6    Period Weeks    Status New    Target Date 02/25/21      PT LONG TERM GOAL #4   Title Pt will  sleep x 6 hours with Rt shoulder pain <= 2/10    Time 6    Period Weeks    Status New    Target Date 02/25/21                  Plan - 01/14/21 1457    Clinical Impression Statement Pt is a 70 y/o male who presents with bilat shoulder pain Rt > Lt with decreased  strength and ROM, impaired posture and increased pain. Pt will benefit from skilled PT to address deficits and improve functional mobility    Personal Factors and Comorbidities Age;Past/Current Experience;Comorbidity 2    Examination-Activity Limitations Lift;Reach Overhead;Carry;Sleep    Examination-Participation Restrictions Community Activity;Yard Work;Meal Prep;Laundry;Cleaning    Stability/Clinical Decision Making Stable/Uncomplicated    Clinical Decision Making Low    Rehab Potential Good    PT Frequency 2x / week    PT Duration 6 weeks    PT Treatment/Interventions ADLs/Self Care Home Management;Cryotherapy;Moist Heat;Iontophoresis 4mg /ml Dexamethasone;Electrical Stimulation;Neuromuscular re-education;Therapeutic exercise;Therapeutic activities;Manual techniques;Patient/family education;Taping;Dry needling;Passive range of motion;Vasopneumatic Device    PT Next Visit Plan assess HEP, progress scapular strength and shoulder ROM as tolerated, manual to decrease impingement    PT Home Exercise Plan [email or web address]           Patient will benefit from skilled therapeutic intervention in order to improve the following deficits and impairments:  Pain,Postural dysfunction,Impaired UE functional use,Decreased strength,Decreased activity tolerance,Decreased range of motion,Increased edema,Hypomobility  Visit Diagnosis: Chronic left shoulder pain - Plan: PT plan of care cert/re-cert  Chronic right shoulder pain - Plan: PT plan of care cert/re-cert  Muscle weakness (generalized) - Plan: PT plan of care cert/re-cert  Abnormal posture - Plan: PT plan of care cert/re-cert  Localized edema - Plan: PT plan of care cert/re-cert     Problem List Patient Active Problem List   Diagnosis Date Noted  . Primary open angle glaucoma (POAG) of right eye, mild stage 01/29/2020  . Combined forms of age-related cataract of left eye 01/22/2020  . Osteoarthritis of left knee 01/05/2020  . Diabetes mellitus,  type II ([place]) 12/08/2016  . Pain syndrome, chronic 02/06/2014  . Pain in extremity 02/06/2014  . Bilateral rotator cuff dysfunction 02/06/2014  . Major depressive disorder, recurrent ([place]) 04/12/2012  . Complete tear of left rotator cuff 08/02/2011  . CAUSALGIA OF UPPER LIMB 08/02/2010  . ANEMIA 05/11/2010  . NEUROPATHY 08/10/2009  . NEPHROLITHIASIS 05/22/2009  . GERD 06/23/2008  . ALLERGIC RHINITIS 01/14/2008  . Hyperlipidemia 06/28/2007  . FATTY LIVER DISEASE 06/28/2007  . DISEASE, CYSTIC KIDNEY NOS, CONGENITAL 06/28/2007   Maleeyah Mccaughey, PT  Sharmila Wrobleski 01/14/2021, 3:07 PM  [facility] [place], [place], [address] Phone: [phone]   Fax:  [phone]  Name: Nathan [name] MRN: [id number] Date of Birth: 07/25/1951

## 2021-01-19 ENCOUNTER — Other Ambulatory Visit: Payer: Self-pay

## 2021-01-19 ENCOUNTER — Ambulatory Visit (INDEPENDENT_AMBULATORY_CARE_PROVIDER_SITE_OTHER): Payer: Medicare Other | Admitting: Physical Therapy

## 2021-01-19 DIAGNOSIS — M6281 Muscle weakness (generalized): Secondary | ICD-10-CM

## 2021-01-19 DIAGNOSIS — R600 Localized edema: Secondary | ICD-10-CM

## 2021-01-19 DIAGNOSIS — M25511 Pain in right shoulder: Secondary | ICD-10-CM

## 2021-01-19 DIAGNOSIS — R293 Abnormal posture: Secondary | ICD-10-CM

## 2021-01-19 DIAGNOSIS — M25512 Pain in left shoulder: Secondary | ICD-10-CM | POA: Diagnosis not present

## 2021-01-19 DIAGNOSIS — G8929 Other chronic pain: Secondary | ICD-10-CM | POA: Diagnosis not present

## 2021-01-19 NOTE — Therapy (Signed)
[place], [place], [address] Phone: [phone]   Fax:  [phone]  Physical Therapy Treatment  Patient Details  Name: Nathan [name] MRN: [id number] Date of Birth: 08/02/1951 Referring Provider (PT): thekkekandam   Encounter Date: 01/19/2021   PT End of Session - 01/19/21 1646    Visit Number 2    Number of Visits 12    Date for PT Re-Evaluation 02/25/21    PT Start Time 9767    PT Stop Time 1640    PT Time Calculation (min) 42 min    Activity Tolerance Patient limited by pain    Behavior During Therapy [facility] for tasks assessed/performed           Past Medical History:  Diagnosis Date  . Adenomatous colon polyp   . Allergy   . Anxiety   . CRPS (complex regional pain syndrome), upper limb   . Depression   . Diabetes mellitus without complication ([place])   . Diverticulitis 2013  . Diverticulosis   . Diverticulosis   . GERD (gastroesophageal reflux disease)   . Glaucoma   . History of Helicobacter pylori infection   . Hyperlipidemia   . Hypertension    no per pt  . Intestinal metaplasia of gastric mucosa   . Ligament tear of upper extremity April 2008   Left elbow  . Nephrolithiasis   . Renal calculi   . Renal disorder     Past Surgical History:  Procedure Laterality Date  . LITHOTRIPSY  2004   kidney stones  . REPLACEMENT TOTAL KNEE Right 2014  . ROTATOR CUFF REPAIR Left 2011  . ULNAR NERVE REPAIR Left 2009, 2010,   3 surgeries    There were no vitals filed for this visit.   Subjective Assessment - 01/19/21 1558    Subjective Pt states his Rt shoulder still feels "sore". states that ice has been helping and he has also been taking tylenol    Patient Stated Goals decrease pain with IADLs    Currently in Pain? No/denies                             [facility] Adult PT Treatment/Exercise - 01/19/21 0001      Shoulder Exercises: Supine   Other Supine  Exercises AAROM flexion with cane x 7      Shoulder Exercises: Standing   Extension Both;20 reps    Theraband Level (Shoulder Extension) Level 2 (Red)    Row Both;20 reps    Theraband Level (Shoulder Row) Level 2 (Red)      Shoulder Exercises: Pulleys   Flexion 2 minutes    ABduction 2 minutes      Modalities   Modalities Iontophoresis      Iontophoresis   Type of Iontophoresis Dexamethasone    Location Rt shoulder    Dose 96ml    Time 4 hour patch      Vasopneumatic   Number Minutes Vasopneumatic  10 minutes    Vasopnuematic Location  Shoulder    Vasopneumatic Pressure Low    Vasopneumatic Temperature  34      Manual Therapy   Manual Therapy Passive ROM;Joint mobilization    Joint Mobilization very gentle grade 1-2 inferior jt mobs and distraction    Passive ROM as tolerated ER, abduction                  PT Education - 01/19/21  1628    Education Details ionto, updated HEP    Person(s) Educated Patient    Methods Demonstration;Explanation;Handout    Comprehension Verbalized understanding;Returned demonstration               PT Long Term Goals - 01/14/21 1459      PT LONG TERM GOAL #1   Title Pt will be independent in HEP    Time 6    Period Weeks    Status New    Target Date 02/25/21      PT LONG TERM GOAL #2   Title Pt will improve bilat shoulder strength to 4/5 to perform reaching overhead with decreased pain    Time 6    Period Weeks    Status New    Target Date 02/25/21      PT LONG TERM GOAL #3   Title Pt will improve bilat shoulder ROM by 10 degrees to perform IADLs with decreased pain    Time 6    Period Weeks    Status New    Target Date 02/25/21      PT LONG TERM GOAL #4   Title Pt will sleep x 6 hours with Rt shoulder pain <= 2/10    Time 6    Period Weeks    Status New    Target Date 02/25/21                 Plan - 01/19/21 1646    Clinical Impression Statement Pt very tender to palpation Rt shoulder, jt mobs  and manual performed as tolerated. Pt with decreased tolerance to exercise but focused on scapular strength and AAROM. Ionto initiated to reduce pain and hypersensitivity    PT Next Visit Plan assess ionto, progress scapular strength and shoulder ROM as tolerated, manual to decrease impingement    PT Home Exercise Plan [email or web address]    Consulted and Agree with Plan of Care Patient           Patient will benefit from skilled therapeutic intervention in order to improve the following deficits and impairments:     Visit Diagnosis: Chronic left shoulder pain  Chronic right shoulder pain  Muscle weakness (generalized)  Abnormal posture  Localized edema     Problem List Patient Active Problem List   Diagnosis Date Noted  . Primary open angle glaucoma (POAG) of right eye, mild stage 01/29/2020  . Combined forms of age-related cataract of left eye 01/22/2020  . Osteoarthritis of left knee 01/05/2020  . Diabetes mellitus, type II ([place]) 12/08/2016  . Pain syndrome, chronic 02/06/2014  . Pain in extremity 02/06/2014  . Bilateral rotator cuff dysfunction 02/06/2014  . Major depressive disorder, recurrent ([place]) 04/12/2012  . Complete tear of left rotator cuff 08/02/2011  . CAUSALGIA OF UPPER LIMB 08/02/2010  . ANEMIA 05/11/2010  . NEUROPATHY 08/10/2009  . NEPHROLITHIASIS 05/22/2009  . GERD 06/23/2008  . ALLERGIC RHINITIS 01/14/2008  . Hyperlipidemia 06/28/2007  . FATTY LIVER DISEASE 06/28/2007  . DISEASE, CYSTIC KIDNEY NOS, CONGENITAL 06/28/2007   Esti Demello, PT  Jonluke Cobbins 01/19/2021, 4:50 PM  [facility] [place], [place], [address] Phone: [phone]   Fax:  [phone]  Name: Atwell Mcdanel MRN: [id number] Date of Birth: [birth year]

## 2021-01-19 NOTE — Patient Instructions (Addendum)
Access Code: [email or web address] URL: [email or web address] Date: 01/19/2021 Prepared by: Isabelle Course  Exercises Seated Shoulder Flexion Towel Slide at Table Top - 1 x daily - 7 x weekly - 3 sets - 10 reps Seated Shoulder Abduction Towel Slide at Table Top - 1 x daily - 7 x weekly - 3 sets - 10 reps Seated Scapular Retraction - 1 x daily - 7 x weekly - 2 sets - 10 reps - 3-5 seconds hold Supine Shoulder Flexion Extension AAROM with Dowel - 1 x daily - 7 x weekly - 3 sets - 10 reps Standing Bilateral Low Shoulder Row with Anchored Resistance - 1 x daily - 7 x weekly - 3 sets - 10 reps Shoulder extension with resistance - Neutral - 1 x daily - 7 x weekly - 3 sets - 10 reps  Patient Education Ionto Patient Instructions

## 2021-01-21 ENCOUNTER — Ambulatory Visit: Payer: Medicare Other | Admitting: Physical Therapy

## 2021-01-21 ENCOUNTER — Other Ambulatory Visit: Payer: Self-pay

## 2021-01-21 DIAGNOSIS — G8929 Other chronic pain: Secondary | ICD-10-CM | POA: Diagnosis not present

## 2021-01-21 DIAGNOSIS — R600 Localized edema: Secondary | ICD-10-CM | POA: Diagnosis not present

## 2021-01-21 DIAGNOSIS — M6281 Muscle weakness (generalized): Secondary | ICD-10-CM

## 2021-01-21 DIAGNOSIS — M25512 Pain in left shoulder: Secondary | ICD-10-CM

## 2021-01-21 DIAGNOSIS — R293 Abnormal posture: Secondary | ICD-10-CM

## 2021-01-21 DIAGNOSIS — M25511 Pain in right shoulder: Secondary | ICD-10-CM

## 2021-01-21 NOTE — Therapy (Addendum)
[place], [place], [address] Phone: [phone]   Fax:  [phone]  Physical Therapy Treatment and Discharge  Patient Details  Name: Nathan [name] MRN: [id number] Date of Birth: 09-22-1951 Referring Provider (PT): thekkekandam   Encounter Date: 01/21/2021   PT End of Session - 01/21/21 1606    Visit Number 3    Number of Visits 12    Date for PT Re-Evaluation 02/25/21    PT Start Time 0093    PT Stop Time 1615    PT Time Calculation (min) 40 min    Activity Tolerance Patient limited by pain    Behavior During Therapy [facility] for tasks assessed/performed           Past Medical History:  Diagnosis Date  . Adenomatous colon polyp   . Allergy   . Anxiety   . CRPS (complex regional pain syndrome), upper limb   . Depression   . Diabetes mellitus without complication ([place])   . Diverticulitis 2013  . Diverticulosis   . Diverticulosis   . GERD (gastroesophageal reflux disease)   . Glaucoma   . History of Helicobacter pylori infection   . Hyperlipidemia   . Hypertension    no per pt  . Intestinal metaplasia of gastric mucosa   . Ligament tear of upper extremity April 2008   Left elbow  . Nephrolithiasis   . Renal calculi   . Renal disorder     Past Surgical History:  Procedure Laterality Date  . LITHOTRIPSY  2004   kidney stones  . REPLACEMENT TOTAL KNEE Right 2014  . ROTATOR CUFF REPAIR Left 2011  . ULNAR NERVE REPAIR Left 2009, 2010,   3 surgeries    There were no vitals filed for this visit.   Subjective Assessment - 01/21/21 1540    Subjective Pt states he had an allergic skin reaction to ionto patch so pt did not wear patch > 15 minutes. Pt continues with significant Rt shoulder pain with any movement    Patient Stated Goals decrease pain with IADLs    Currently in Pain? Yes    Pain Score 6     Pain Location Shoulder    Pain Orientation Right    Pain Descriptors /  Indicators Sharp              [facility] PT Assessment - 01/21/21 0001      Strength   Right Shoulder Flexion 3-/5    Right Shoulder ABduction 3-/5                         [facility] Adult PT Treatment/Exercise - 01/21/21 0001      Shoulder Exercises: Seated   External Rotation Both;20 reps    External Rotation Limitations Ws with no resistance      Shoulder Exercises: Standing   ABduction Both;15 reps    Theraband Level (Shoulder ABduction) Level 2 (Red)    Extension Both;20 reps    Theraband Level (Shoulder Extension) Level 2 (Red)    Row Both;20 reps    Theraband Level (Shoulder Row) Level 2 (Red)      Shoulder Exercises: Pulleys   Flexion 2 minutes    ABduction 2 minutes      Vasopneumatic   Number Minutes Vasopneumatic  10 minutes    Vasopnuematic Location  Shoulder    Vasopneumatic Pressure Low    Vasopneumatic Temperature  34  Manual Therapy   Joint Mobilization very gentle inferior jt mobs grade 1-2    Passive ROM as tolerated ER and abduction                       PT Long Term Goals - 01/14/21 1459      PT LONG TERM GOAL #1   Title Pt will be independent in HEP    Time 6    Period Weeks    Status New    Target Date 02/25/21      PT LONG TERM GOAL #2   Title Pt will improve bilat shoulder strength to 4/5 to perform reaching overhead with decreased pain    Time 6    Period Weeks    Status New    Target Date 02/25/21      PT LONG TERM GOAL #3   Title Pt will improve bilat shoulder ROM by 10 degrees to perform IADLs with decreased pain    Time 6    Period Weeks    Status New    Target Date 02/25/21      PT LONG TERM GOAL #4   Title Pt will sleep x 6 hours with Rt shoulder pain <= 2/10    Time 6    Period Weeks    Status New    Target Date 02/25/21                 Plan - 01/21/21 1607    Clinical Impression Statement Pt continues with significant Rt shoulder pain limiting progression of strengthening and  ROM.  PT and pt discussed having pt return to MD to assess prognosis and POC. Pt agreeable.    PT Next Visit Plan progress as tolerated ROM and strength    PT Home Exercise Plan [email or web address]    Consulted and Agree with Plan of Care Patient           Patient will benefit from skilled therapeutic intervention in order to improve the following deficits and impairments:     Visit Diagnosis: Chronic left shoulder pain  Chronic right shoulder pain  Muscle weakness (generalized)  Abnormal posture  Localized edema     Problem List Patient Active Problem List   Diagnosis Date Noted  . Primary open angle glaucoma (POAG) of right eye, mild stage 01/29/2020  . Combined forms of age-related cataract of left eye 01/22/2020  . Osteoarthritis of left knee 01/05/2020  . Diabetes mellitus, type II ([place]) 12/08/2016  . Pain syndrome, chronic 02/06/2014  . Pain in extremity 02/06/2014  . Bilateral rotator cuff dysfunction 02/06/2014  . Major depressive disorder, recurrent ([place]) 04/12/2012  . Complete tear of left rotator cuff 08/02/2011  . CAUSALGIA OF UPPER LIMB 08/02/2010  . ANEMIA 05/11/2010  . NEUROPATHY 08/10/2009  . NEPHROLITHIASIS 05/22/2009  . GERD 06/23/2008  . ALLERGIC RHINITIS 01/14/2008  . Hyperlipidemia 06/28/2007  . FATTY LIVER DISEASE 06/28/2007  . DISEASE, CYSTIC KIDNEY NOS, CONGENITAL 06/28/2007   PHYSICAL THERAPY DISCHARGE SUMMARY  Visits from Start of Care: 3  Current functional level related to goals / functional outcomes: Progress limited by pain   Remaining deficits: Pain, decreased strength and ROM   Education / Equipment: HEP  Plan: Patient agrees to discharge.  Patient goals were not met. Patient is being discharged due to lack of progress.  ?????    Isabelle Course, PT,DPT05/25/228:32 AM   Isabelle Course, PT  Yoltzin Ransom 01/21/2021, 4:10 PM  [place] [phone]  [place],  [place], [address] Phone: [phone]   Fax:  [phone]  Name: Nathan [name] MRN: [id number] Date of Birth: 02/05/1951

## 2021-01-26 ENCOUNTER — Encounter: Payer: Medicare Other | Admitting: Physical Therapy

## 2021-01-28 ENCOUNTER — Encounter: Payer: Medicare Other | Admitting: Rehabilitative and Restorative Service Providers"

## 2021-02-07 ENCOUNTER — Telehealth: Payer: Self-pay | Admitting: Family Medicine

## 2021-02-07 NOTE — Chronic Care Management (AMB) (Signed)
  Chronic Care Management   Note  02/07/2021 Name: Nathan [name] MRN: [id number] DOB: [birth year]  [name] is a 70 y.o. year old male who is a primary care patient of Metheney, Rene Kocher, MD. I reached out to [facility] by phone today in response to a referral sent by [name]'s PCP, Hali Marry, MD.   [name] was given information about Chronic Care Management services today including:  1. CCM service includes personalized support from designated clinical staff supervised by his physician, including individualized plan of care and coordination with other care providers 2. 24/7 contact phone numbers for assistance for urgent and routine care needs. 3. Service will only be billed when office clinical staff spend 20 minutes or more in a month to coordinate care. 4. Only one practitioner may furnish and bill the service in a calendar month. 5. The patient may stop CCM services at any time (effective at the end of the month) by phone call to the office staff.   Patient agreed to services and verbal consent obtained.   Follow up plan:   Lauretta Grill Upstream Scheduler

## 2021-02-08 ENCOUNTER — Telehealth (HOSPITAL_COMMUNITY): Payer: Medicare Other | Admitting: Psychiatry

## 2021-02-15 NOTE — Progress Notes (Deleted)
Current Barriers:  . {PHARMCACYBARRIERS:21091514}  Pharmacist Clinical Goal(s):  [name] Over the next *** days, patient will {PHARMACYGOALCHOICES:25079} through collaboration with PharmD and provider.   Interventions: . 1:1 collaboration with Hali Marry, MD regarding development and update of comprehensive plan of care as evidenced by provider attestation and co-signature . Inter-disciplinary care team collaboration (see longitudinal plan of care) . Comprehensive medication review performed; medication list updated in electronic medical record  Diabetes: . Controlled; current treatment: lifestyle modifications only;  . Current glucose readings: fasting glucose: ***, post prandial glucose: *** . Denies/reports hypoglycemic/hyperglycemic symptoms . Current meal patterns: breakfast: ***; lunch: ***; dinner: ***; snacks: ***; drinks: *** . Current exercise: *** . {PHARMACYINTERVENTION:21091513} and  Hyperlipidemia: . Controlled; current treatment: Atorvastatin 39m;  . {PHARMACYINTERVENTION:21091513}  Patient Goals/Self-Care Activities . Over the next *** days, patient will:  {PHARMACYPATIENTGOALS:25081}  Follow Up Plan: {CM FOLLOW UP PMBOM:85927}***  Chronic Care Management Pharmacy Note  02/15/2021 Name:  Nathan GoldringMRN:  [id number]:  1Aug 08, 1952 Subjective: Nathan [name] an 70y.o. year old male who is a primary patient of Metheney, CRene Kocher MD.  The CCM team was consulted for assistance with disease management and care coordination needs.    Engaged with patient face to face for initial visit in response to provider referral for pharmacy case management and/or care coordination services.   Consent to Services:  The patient was given information about Chronic Care Management services, agreed to services, and gave verbal consent prior to initiation of services.  Please see initial visit note for detailed documentation.   Patient Care Team: MHali Marry MD as PCP - General KDarius Bump [facility] Pharmac[facility] (Pharmacist)  Recent office visits: 10/01/20 - Dr. MMadilyn Fireman(PCP): DM, HLD f/u 01/11/21 - Dr. TDianah Field shoulder pain -> meloxicam, PT  Recent consult visits: 10/19/20 - Dr. ADe Nurse(behavioral med): depression, GAD -> stable  Hospital visits: None in previous 6 months  Objective:  Lab Results  Component Value Date   CREATININE 0.78 01/12/2020   CREATININE 0.88 08/28/2019   CREATININE 0.85 03/19/2019    Lab Results  Component Value Date   HGBA1C 5.8 (A) 10/01/2020   Last diabetic Eye exam:  Lab Results  Component Value Date/Time   HMDIABEYEEXA No Retinopathy 09/24/2019 12:00 AM    Last diabetic Foot exam: No results found for: HMDIABFOOTEX      Component Value Date/Time   CHOL 130 01/12/2020 0842   TRIG 175 (H) 01/12/2020 0842   HDL 44 01/12/2020 0842   CHOLHDL 3.0 01/12/2020 0842   VLDL 45 (H) 06/14/2016 0803   LDLCALC 61 01/12/2020 0842   LDLDIRECT 129 (H) 08/28/2019 0822    Hepatic Function Latest Ref Rng & Units 01/12/2020 08/28/2019 03/19/2019  Total Protein 6.1 - 8.1 g/dL 7.1 7.6 7.0  Albumin 3.6 - 5.1 g/dL - - -  AST 10 - 35 U/L '20 22 23  ' ALT 9 - 46 U/L '21 18 18  ' Alk Phosphatase 40 - 115 U/L - - -  Total Bilirubin 0.2 - 1.2 mg/dL 0.9 0.9 1.1    Lab Results  Component Value Date/Time   TSH 2.253 12/27/2011 12:07 PM   TSH 1.290 08/10/2009 08:40 PM    CBC Latest Ref Rng & Units 03/24/2013 04/11/2012 02/20/2012  WBC 4.0 - 10.5 K/uL 8.6 11.6(H) 9.0  Hemoglobin 13.0 - 17.0 g/dL 15.9 16.9 15.8  Hematocrit 39.0 - 52.0 % 47.1 49.6 46.6  Platelets 150 - 400 K/uL 204 297 226  Lab Results  Component Value Date/Time   VD25OH 15 (L) 08/10/2009 08:40 PM    Clinical ASCVD: {YES/NO:21197} The 10-year ASCVD risk score Mikey Bussing DC Jr., et al., 2013) is: 26.7%   Values used to calculate the score:     Age: 70 years     Sex: Male     Is Non-Hispanic African American: No     Diabetic: Yes      Tobacco smoker: No     Systolic Blood Pressure: 045 mmHg     Is BP treated: No     HDL Cholesterol: 44 mg/dL     Total Cholesterol: 130 mg/dL    Other: (CHADS2VASc if Afib, PHQ9 if depression, MMRC or CAT for COPD, ACT, DEXA)  Social History   Tobacco Use  Smoking Status Former Smoker  . Packs/day: 1.00  . Years: 10.00  . Pack years: 10.00  . Types: Cigarettes  . Quit date: 09/25/1981  . Years since quitting: 39.4  Smokeless Tobacco Never Used   BP Readings from Last 3 Encounters:  10/01/20 131/66  05/27/20 118/65  01/05/20 117/65   Pulse Readings from Last 3 Encounters:  10/01/20 72  05/27/20 68  01/05/20 70   Wt Readings from Last 3 Encounters:  10/01/20 161 lb 9.6 oz (73.3 kg)  05/27/20 171 lb (77.6 kg)  01/05/20 173 lb (78.5 kg)    Assessment: Review of patient past medical history, allergies, medications, health status, including review of consultants reports, laboratory and other test data, was performed as part of comprehensive evaluation and provision of chronic care management services.   SDOH:  (Social Determinants of Health) assessments and interventions performed:    CCM Care Plan  Allergies  Allergen Reactions  . Other Rash  . Adhesive [Tape]     Hives on area wear tape applied  . Hydrocodone-Acetaminophen Itching  . Hydrocodone Rash    Medications Reviewed Today    Reviewed by Kennith Gain, PT (Physical Therapist) on 01/21/21 at 32  Med List Status: <None>  Medication Order Taking? Sig Documenting Provider Last Dose Status Informant  atorvastatin (LIPITOR) 40 MG tablet [id number]  Take 1 tablet (40 mg total) by mouth daily. Hali Marry, MD  Active   Blood Glucose Monitoring Suppl (ONE TOUCH ULTRA SYSTEM KIT) w/Device KIT [id number] No For testing blood sugars daily Dx: E11.9 Hali Marry, MD Taking Active   busPIRone (BUSPAR) 10 MG tablet [id number]  Take 1 tablet (10 mg total) by mouth daily. [name], MD  Active    glucose blood (ONE TOUCH ULTRA TEST) test strip [id number] No USE 1 STRIP TO CHECK GLUCOSE ONCE DAILY Hali Marry, MD Taking Active   meloxicam (MOBIC) 15 MG tablet [id number]  One tab PO qAM with a meal for 2 weeks, then daily prn pain. Silverio Decamp, MD  Active   OneTouch Delica Lancets 61Q [place] [id number] No 1 strip by Other route in the morning. Hali Marry, MD Taking Active   sertraline (ZOLOFT) 100 MG tablet [id number]  Take 1 tablet by mouth once daily [name], MD  Active   traZODone (DESYREL) 50 MG tablet [id number]  TAKE 1 TABLET BY MOUTH AT BEDTIME [name], MD  Active   vitamin E 100 UNIT capsule [id number] No Take by mouth daily. [provider] Taking Active Self  VOLTAREN 1 % GEL [id number] No  [provider] Taking Active            Med Note Maryruth Eve, TONYA  L   Mon Jan 05, 2020 10:24 AM)            Patient Active Problem List   Diagnosis Date Noted  . Primary open angle glaucoma (POAG) of right eye, mild stage 01/29/2020  . Combined forms of age-related cataract of left eye 01/22/2020  . Osteoarthritis of left knee 01/05/2020  . Diabetes mellitus, type II ([place]) 12/08/2016  . Pain syndrome, chronic 02/06/2014  . Pain in extremity 02/06/2014  . Bilateral rotator cuff dysfunction 02/06/2014  . Major depressive disorder, recurrent ([place]) 04/12/2012  . Complete tear of left rotator cuff 08/02/2011  . CAUSALGIA OF UPPER LIMB 08/02/2010  . ANEMIA 05/11/2010  . NEUROPATHY 08/10/2009  . NEPHROLITHIASIS 05/22/2009  . GERD 06/23/2008  . ALLERGIC RHINITIS 01/14/2008  . Hyperlipidemia 06/28/2007  . FATTY LIVER DISEASE 06/28/2007  . DISEASE, CYSTIC KIDNEY NOS, CONGENITAL 06/28/2007    Immunization History  Administered Date(s) Administered  . Hep A / Hep B 07/30/2015, 08/09/2015, 08/30/2015  . Influenza Whole 06/25/2009  . Moderna Sars-Covid-2 Vaccination 01/26/2020, 02/23/2020  . Pneumococcal Conjugate-13 12/07/2016  .  Pneumococcal Polysaccharide-23 09/05/2019  . Tdap 06/17/2007, 07/24/2018    Conditions to be addressed/monitored: HLD and DMII  There are no care plans that you recently modified to display for this patient.   Medication Assistance: {MEDASSISTANCEINFO:25044}  [name] preferred pharmacy is:  [place], [place] [facility] [address] [place] [address] Phone: [phone] Fax: [phone]  Uses pill box? {Yes or If no, why not?:20788} Pt endorses ***% compliance  Follow Up:  {FOLLOWUP:24991}  Plan: {CM FOLLOW UP PLAN:25073}  SIG***

## 2021-02-16 ENCOUNTER — Ambulatory Visit: Payer: Medicare Other

## 2021-02-16 ENCOUNTER — Telehealth: Payer: Self-pay | Admitting: Pharmacist

## 2021-02-16 NOTE — Telephone Encounter (Signed)
Patient has been cancelled from scheduled for today. AM

## 2021-02-16 NOTE — Telephone Encounter (Signed)
Patient was a no-show for initial, face-to-face visit for Chronic Care Management services with pharmacist.   Routing to schedule team for reschedule.  Nathan [name]

## 2021-02-22 ENCOUNTER — Ambulatory Visit: Payer: Medicare Other | Admitting: Sports Medicine

## 2021-03-26 ENCOUNTER — Other Ambulatory Visit (HOSPITAL_COMMUNITY): Payer: Self-pay | Admitting: Psychiatry

## 2021-03-31 ENCOUNTER — Ambulatory Visit: Payer: Medicare Other | Admitting: Family Medicine

## 2021-04-17 ENCOUNTER — Other Ambulatory Visit (HOSPITAL_COMMUNITY): Payer: Self-pay | Admitting: Psychiatry

## 2021-04-29 ENCOUNTER — Encounter (HOSPITAL_COMMUNITY): Payer: Self-pay | Admitting: Psychiatry

## 2021-04-29 ENCOUNTER — Telehealth (INDEPENDENT_AMBULATORY_CARE_PROVIDER_SITE_OTHER): Payer: Medicare Other | Admitting: Psychiatry

## 2021-04-29 DIAGNOSIS — F331 Major depressive disorder, recurrent, moderate: Secondary | ICD-10-CM

## 2021-04-29 DIAGNOSIS — F411 Generalized anxiety disorder: Secondary | ICD-10-CM

## 2021-04-29 DIAGNOSIS — F5102 Adjustment insomnia: Secondary | ICD-10-CM

## 2021-04-29 MED ORDER — TRAZODONE HCL 50 MG PO TABS: 50.0000 mg | ORAL_TABLET | Freq: Every day | ORAL | 0 refills | Status: DC

## 2021-04-29 MED ORDER — BUSPIRONE HCL 10 MG PO TABS: 10.0000 mg | ORAL_TABLET | Freq: Every day | ORAL | 1 refills | Status: DC

## 2021-04-29 NOTE — Progress Notes (Signed)
Patient ID: Nathan [name], male   DOB: 1950/10/10, 70 y.o.   MRN: [id number]   [facility] Follow-up Outpatient Visit  Nathan [name] [birth year]  Date:04/29/21   Virtual Visit via Telephone Note  I connected with Nathan [name] on 04/29/21 at 12:15 PM EDT by telephone and verified that I am speaking with the correct person using two identifiers.  Location: Patient: home Provider: home office   I discussed the limitations, risks, security and privacy concerns of performing an evaluation and management service by telephone and the availability of in person appointments. I also discussed with the patient that there may be a patient responsible charge related to this service. The patient expressed understanding and agreed to proceed.      I discussed the assessment and treatment plan with the patient. The patient was provided an opportunity to ask questions and all were answered. The patient agreed with the plan and demonstrated an understanding of the instructions.   The patient was advised to call back or seek an in-person evaluation if the symptoms worsen or if the condition fails to improve as anticipated.  I provided 11 minutes of non-face-to-face time during this encounter.    HPI Comments: Nathan [name] is a 70 y/o male with a past psychiatric history significant for Major Depressive Disorder. GAD, insomnia.  The patient returns for psychiatric services for medication management.   Doing fair, buspar prn helps with anxiety  Otherwise zoloft regular med  Duration: post retirement.  Support: son, wife Severity ; not worse,     Aggravating factor Finances,   Duration:12 years       Review of Systems  Cardiovascular:  Negative for chest pain.  Psychiatric/Behavioral:  Negative for depression.      There were no vitals filed for this visit. Physical Exam        Past Medical History: Reviewed  Past Medical History:  Diagnosis Date    Adenomatous colon polyp    Allergy    Anxiety    CRPS (complex regional pain syndrome), upper limb    Depression    Diabetes mellitus without complication ([place])    Diverticulitis 2013   Diverticulosis    Diverticulosis    GERD (gastroesophageal reflux disease)    Glaucoma    History of Helicobacter pylori infection    Hyperlipidemia    Hypertension    no per pt   Intestinal metaplasia of gastric mucosa    Ligament tear of upper extremity April 2008   Left elbow   Nephrolithiasis    Renal calculi    Renal disorder       Allergies: Reviewed  Allergies  Allergen Reactions   Other Rash   Adhesive [Tape]     Hives on area wear tape applied   Hydrocodone-Acetaminophen Itching   Hydrocodone Rash   Current Medications: Reviewed  Current Outpatient Medications on File Prior to Visit  Medication Sig Dispense Refill   atorvastatin (LIPITOR) 40 MG tablet Take 1 tablet (40 mg total) by mouth daily. 90 tablet 3   Blood Glucose Monitoring Suppl (ONE TOUCH ULTRA SYSTEM KIT) w/Device KIT For testing blood sugars daily Dx: E11.9 1 each 0   glucose blood (ONE TOUCH ULTRA TEST) test strip USE 1 STRIP TO CHECK GLUCOSE ONCE DAILY 100 each 0   meloxicam (MOBIC) 15 MG tablet One tab PO qAM with a meal for 2 weeks, then daily prn pain. 30 tablet 3   OneTouch Delica Lancets 81O MISC 1 strip by Other route  in the morning. 100 each PRN   sertraline (ZOLOFT) 100 MG tablet Take 1 tablet by mouth once daily 90 tablet 0   vitamin E 100 UNIT capsule Take by mouth daily.     VOLTAREN 1 % GEL   5   No current facility-administered medications on file prior to visit.      Family History: Reviewed  Family History  Problem Relation Age of Onset   Lung cancer Father    Kidney disease Mother    Colon cancer Neg Hx    Esophageal cancer Neg Hx    Rectal cancer Neg Hx    Stomach cancer Neg Hx     Psychiatric specialty examination:  Objective: Appearance:   Eye Contact::   Speech: Clear and  Coherent and Normal Rate   Volume: Normal   Mood: fair  Affect: pleasant  Thought Process: Coherent, Logical and Loose   Orientation: Full   Thought Content: WDL   Suicidal Thoughts: No   Homicidal Thoughts: No   Judgement: Fair   Insight: Fair   Psychomotor Activity: Normal   Akathisia: No   Handed: Right   Memory: Immediate 3/3, recent 3/3   Concentration: Good   AIMS (if indicated): Not Indicated   Assets: Surveyor, mining  Housing  Social Support   Prior documentation reviewed  Assessment:  AXIS I  Major Depressive Disorder, recurrent, moderate. GAD. Insomnia   Treatment Plan/Recommendations:          Medications:  Anxiety:doing fair on zoloft, continue and buspar prn  Major depression: doing better, continue zoloft Insomnia:manageable on trazadone, wll continue Fu 93m          NMerian Capron [name]  04/29/2021 12:25 PM

## 2021-05-14 LAB — HEMOGLOBIN A1C: Hemoglobin A1C: 6

## 2021-05-31 DIAGNOSIS — Z20822 Contact with and (suspected) exposure to covid-19: Secondary | ICD-10-CM | POA: Diagnosis not present

## 2021-06-01 ENCOUNTER — Encounter: Payer: Self-pay | Admitting: Family Medicine

## 2021-06-03 ENCOUNTER — Telehealth: Payer: Self-pay | Admitting: Family Medicine

## 2021-06-03 NOTE — Telephone Encounter (Signed)
Left message for patient to call back and schedule Medicare Annual Wellness Visit (AWV) on  Saturday Sept 10,2022 to do by phone or video 8-12  If unable to do Saturday, please get scheduled virtually/telephone on day during the week  Last AWV  07/24/2018 Please schedule at any time with [facility] Advisor.      40 Minutes appointment   Any questions, please call me at [phone]

## 2021-06-26 ENCOUNTER — Other Ambulatory Visit (HOSPITAL_COMMUNITY): Payer: Self-pay | Admitting: Psychiatry

## 2021-06-28 ENCOUNTER — Telehealth (HOSPITAL_COMMUNITY): Payer: Self-pay

## 2021-06-28 ENCOUNTER — Other Ambulatory Visit: Payer: Self-pay

## 2021-06-28 ENCOUNTER — Other Ambulatory Visit (HOSPITAL_COMMUNITY): Payer: Self-pay | Admitting: Psychiatry

## 2021-06-28 DIAGNOSIS — M67911 Unspecified disorder of synovium and tendon, right shoulder: Secondary | ICD-10-CM

## 2021-06-28 MED ORDER — BUSPIRONE HCL 10 MG PO TABS: 10.0000 mg | ORAL_TABLET | Freq: Every day | ORAL | 1 refills | Status: DC

## 2021-06-28 MED ORDER — MELOXICAM 15 MG PO TABS
ORAL_TABLET | ORAL | 3 refills | Status: DC
Start: 2021-06-28 — End: 2021-11-21

## 2021-06-28 NOTE — Telephone Encounter (Signed)
A new Buspirone 10 mg order, one by mouth daily, #30 with 1 refill e-scribed to patient's [facility] as approved by Dr. De Nurse this date.

## 2021-06-28 NOTE — Telephone Encounter (Signed)
Medication refill request - Fax from pt's [facility] requesting a new Buspirone 10 mg order, last provided 04/29/21 + 1 refill and pt returns next on 09/05/21.

## 2021-08-10 ENCOUNTER — Ambulatory Visit (INDEPENDENT_AMBULATORY_CARE_PROVIDER_SITE_OTHER): Payer: Medicare Other | Admitting: Family Medicine

## 2021-08-10 ENCOUNTER — Encounter: Payer: Self-pay | Admitting: Family Medicine

## 2021-08-10 ENCOUNTER — Other Ambulatory Visit: Payer: Self-pay

## 2021-08-10 VITALS — BP 124/71 | HR 67 | Ht 68.0 in | Wt 172.0 lb

## 2021-08-10 DIAGNOSIS — H0012 Chalazion right lower eyelid: Secondary | ICD-10-CM

## 2021-08-10 DIAGNOSIS — Z1211 Encounter for screening for malignant neoplasm of colon: Secondary | ICD-10-CM | POA: Diagnosis not present

## 2021-08-10 MED ORDER — BACITRACIN-POLYMYXIN B 500-10000 UNIT/GM OP OINT: 1.0000 "application " | TOPICAL_OINTMENT | Freq: Three times a day (TID) | OPHTHALMIC | 0 refills | Status: DC

## 2021-08-10 NOTE — Progress Notes (Signed)
Acute Office Visit  Subjective:    Patient ID: Nathan [name], male    DOB: [birth year], 70 y.o.   MRN: [id number]  Chief Complaint  Patient presents with   Stye    HPI Patient is in today for "stye" on eyelid.  Says has been present x 2 weeks.  Not painful and no vision changes he says it just was very itchy.  He says the itchiness has gotten a little bit better.  He has had a little bit of discharge and crusting from the eye and says 1 day he noticed a little bit of pus.  He said he actually had a similar lesion on that lower eyelid recently but it went away on its own this time it has not.  Past Medical History:  Diagnosis Date   Adenomatous colon polyp    Allergy    Anxiety    CRPS (complex regional pain syndrome), upper limb    Depression    Diabetes mellitus without complication ([place])    Diverticulitis 2013   Diverticulosis    Diverticulosis    GERD (gastroesophageal reflux disease)    Glaucoma    History of Helicobacter pylori infection    Hyperlipidemia    Hypertension    no per pt   Intestinal metaplasia of gastric mucosa    Ligament tear of upper extremity April 2008   Left elbow   Nephrolithiasis    Renal calculi    Renal disorder     Past Surgical History:  Procedure Laterality Date   LITHOTRIPSY  2004   kidney stones   REPLACEMENT TOTAL KNEE Right 2014   ROTATOR CUFF REPAIR Left 2011   ULNAR NERVE REPAIR Left 2009, 2010,   3 surgeries    Family History  Problem Relation Age of Onset   Lung cancer Father    Kidney disease Mother    Colon cancer Neg Hx    Esophageal cancer Neg Hx    Rectal cancer Neg Hx    Stomach cancer Neg Hx     Social History   Socioeconomic History   Marital status: Married    Spouse name: Not on file   Number of children: 1   Years of education: 16   Highest education level: Bachelor's degree (e.g., BA, AB, BS)  Occupational History   Occupation: AIRCRAFT INT [employer]    Employer: [employer]    Comment: Retired.    Tobacco Use   Smoking status: Former    Packs/day: 1.00    Years: 10.00    Pack years: 10.00    Types: Cigarettes    Quit date: 09/25/1981    Years since quitting: 39.9   Smokeless tobacco: Never  Vaping Use   Vaping Use: Never used  Substance and Sexual Activity   Alcohol use: Yes    Alcohol/week: 1.0 standard drink    Types: 1 Cans of beer per week    Comment: 1-2 per wk   Drug use: No   Sexual activity: Yes    Partners: Female  Other Topics Concern   Not on file  Social History Narrative   Some exercise. Caffeine daily. Plays chess to keep brain stimulated   Social Determinants of Health   Financial Resource Strain: Not on file  Food Insecurity: Not on file  Transportation Needs: Not on file  Physical Activity: Not on file  Stress: Not on file  Social Connections: Not on file  Intimate Partner Violence: Not on file    Outpatient Medications  Prior to Visit  Medication Sig Dispense Refill   atorvastatin (LIPITOR) 40 MG tablet Take 1 tablet (40 mg total) by mouth daily. 90 tablet 3   Blood Glucose Monitoring Suppl (ONE TOUCH ULTRA SYSTEM KIT) w/Device KIT For testing blood sugars daily Dx: E11.9 1 each 0   busPIRone (BUSPAR) 10 MG tablet Take 1 tablet (10 mg total) by mouth daily. 30 tablet 1   glucose blood (ONE TOUCH ULTRA TEST) test strip USE 1 STRIP TO CHECK GLUCOSE ONCE DAILY 100 each 0   meloxicam (MOBIC) 15 MG tablet One tab PO qAM with a meal for 2 weeks, then daily prn pain. 30 tablet 3   Multiple Vitamins-Minerals (CENTRUM ADULTS PO) Take 1 tablet by mouth daily.     OneTouch Delica Lancets 43P MISC 1 strip by Other route in the morning. 100 each PRN   sertraline (ZOLOFT) 100 MG tablet Take 1 tablet by mouth once daily 90 tablet 0   traZODone (DESYREL) 50 MG tablet Take 1 tablet (50 mg total) by mouth at bedtime. 90 tablet 0   VOLTAREN 1 % GEL   5   vitamin E 100 UNIT capsule Take by mouth daily.     No facility-administered medications prior to visit.     Allergies  Allergen Reactions   Other Rash   Adhesive [Tape]     Hives on area wear tape applied   Hydrocodone-Acetaminophen Itching   Hydrocodone Rash    Review of Systems     Objective:    Physical Exam Vitals reviewed.  Constitutional:      Appearance: He is well-developed.  HENT:     Head: Normocephalic and atraumatic.  Eyes:     Conjunctiva/sclera: Conjunctivae normal.     Pupils: Pupils are equal, round, and reactive to light.     Comments: Right eye with a very well circumscribed round papular lesion on the inside of the lower lid.  I did not see any significant edema outside of the lid or along the surface and lash line.  No discharge or crusting.  Cardiovascular:     Rate and Rhythm: Normal rate.  Pulmonary:     Effort: Pulmonary effort is normal.  Skin:    General: Skin is dry.     Coloration: Skin is not pale.  Neurological:     Mental Status: He is alert and oriented to person, place, and time.  Psychiatric:        Behavior: Behavior normal.    BP 124/71   Pulse 67   Ht '5\' 8"'  (1.727 m)   Wt 172 lb (78 kg)   SpO2 98%   BMI 26.15 kg/m  Wt Readings from Last 3 Encounters:  08/10/21 172 lb (78 kg)  10/01/20 161 lb 9.6 oz (73.3 kg)  05/27/20 171 lb (77.6 kg)    Health Maintenance Due  Topic Date Due   Zoster Vaccines- Shingrix (1 of 2) Never done   COLONOSCOPY (Pts 45-3yr Insurance coverage will need to be confirmed)  02/01/2020   OPHTHALMOLOGY EXAM  01/22/2021   URINE MICROALBUMIN  10/01/2021    There are no preventive care reminders to display for this patient.   Lab Results  Component Value Date   TSH 2.253 12/27/2011   Lab Results  Component Value Date   WBC 8.6 03/24/2013   HGB 15.9 03/24/2013   HCT 47.1 03/24/2013   MCV 83.4 03/24/2013   PLT 204 03/24/2013   Lab Results  Component Value Date  NA 141 01/12/2020   K 4.3 01/12/2020   CO2 30 01/12/2020   GLUCOSE 124 (H) 01/12/2020   BUN 21 01/12/2020   CREATININE 0.78  01/12/2020   BILITOT 0.9 01/12/2020   ALKPHOS 105 06/14/2016   AST 20 01/12/2020   ALT 21 01/12/2020   PROT 7.1 01/12/2020   ALBUMIN 4.1 06/14/2016   CALCIUM 9.4 01/12/2020   Lab Results  Component Value Date   CHOL 130 01/12/2020   Lab Results  Component Value Date   HDL 44 01/12/2020   Lab Results  Component Value Date   LDLCALC 61 01/12/2020   Lab Results  Component Value Date   TRIG 175 (H) 01/12/2020   Lab Results  Component Value Date   CHOLHDL 3.0 01/12/2020   Lab Results  Component Value Date   HGBA1C 6.0 05/14/2021       Assessment & Plan:   Problem List Items Addressed This Visit   None Visit Diagnoses     Screening for colon cancer    -  Primary   Relevant Orders   Ambulatory referral to Gastroenterology   Chalazion of right lower eyelid          Most consistent with a chalazion-discussed diagnosis and treatment.  Recommend warm compresses, gentle massage.  Also given antibiotic ointment since he has been noticing a little bit of crusting and discharge from the eye.  I did not see any on exam today.  We discussed that if its not improving in the next 2 to 3 weeks to call his eye doctor, Dr. Domingo Cocking to get in for further evaluation.  And if at any point he feels like it is getting worse, getting larger, or becoming painful then to call his office sooner rather than later.  Meds ordered this encounter  Medications   bacitracin-polymyxin b (POLYSPORIN) ophthalmic ointment    Sig: Place 1 application into the right eye 3 (three) times daily. apply to eye every 12 hours while awake, x 7 days    Dispense:  3.5 g    Refill:  0     Beatrice Lecher, MD

## 2021-08-26 ENCOUNTER — Telehealth (INDEPENDENT_AMBULATORY_CARE_PROVIDER_SITE_OTHER): Payer: Medicare Other | Admitting: Psychiatry

## 2021-08-26 ENCOUNTER — Encounter (HOSPITAL_COMMUNITY): Payer: Self-pay | Admitting: Psychiatry

## 2021-08-26 DIAGNOSIS — F5102 Adjustment insomnia: Secondary | ICD-10-CM | POA: Diagnosis not present

## 2021-08-26 DIAGNOSIS — F411 Generalized anxiety disorder: Secondary | ICD-10-CM

## 2021-08-26 DIAGNOSIS — F331 Major depressive disorder, recurrent, moderate: Secondary | ICD-10-CM | POA: Diagnosis not present

## 2021-08-26 MED ORDER — TRAZODONE HCL 50 MG PO TABS: 50.0000 mg | ORAL_TABLET | Freq: Every day | ORAL | 0 refills | Status: DC

## 2021-08-26 NOTE — Progress Notes (Signed)
Patient ID: Nathan [name], male   DOB: 1950/12/20, 70 y.o.   MRN: [id number]   [facility] Follow-up Outpatient Visit  Nathan [name] 08-18-1951  Date:08/26/21   Virtual Visit via Telephone Note  I connected with Nathan [name] on 08/26/21 at 12:30 PM EST by telephone and verified that I am speaking with the correct person using two identifiers.  Location: Patient: home Provider: home office   I discussed the limitations, risks, security and privacy concerns of performing an evaluation and management service by telephone and the availability of in person appointments. I also discussed with the patient that there may be a patient responsible charge related to this service. The patient expressed understanding and agreed to proceed.      I discussed the assessment and treatment plan with the patient. The patient was provided an opportunity to ask questions and all were answered. The patient agreed with the plan and demonstrated an understanding of the instructions.   The patient was advised to call back or seek an in-person evaluation if the symptoms worsen or if the condition fails to improve as anticipated.  I provided 11 minutes of non-face-to-face time during this encounter including charting and review    HPI Comments: Nathan [name] is a 69 y/o male with a past psychiatric history significant for Major Depressive Disorder. GAD, insomnia.    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  Support: son, wife Severity ; not worse,     Aggravating factor Finances,   Duration:12 years       Review of Systems  Cardiovascular:  Negative for chest pain.  Psychiatric/Behavioral:  Negative for depression.      There were no vitals filed for this visit. Physical Exam        Past Medical History: Reviewed  Past Medical History:  Diagnosis Date    Adenomatous colon polyp    Allergy    Anxiety    CRPS (complex regional pain syndrome), upper limb    Depression    Diabetes mellitus without complication ([place])    Diverticulitis 2013   Diverticulosis    Diverticulosis    GERD (gastroesophageal reflux disease)    Glaucoma    History of Helicobacter pylori infection    Hyperlipidemia    Hypertension    no per pt   Intestinal metaplasia of gastric mucosa    Ligament tear of upper extremity April 2008   Left elbow   Nephrolithiasis    Renal calculi    Renal disorder       Allergies: Reviewed  Allergies  Allergen Reactions   Other Rash   Adhesive [Tape]     Hives on area wear tape applied   Hydrocodone-Acetaminophen Itching   Hydrocodone Rash   Current Medications: Reviewed  Current Outpatient Medications on File Prior to Visit  Medication Sig Dispense Refill   atorvastatin (LIPITOR) 40 MG tablet Take 1 tablet (40 mg total) by mouth daily. 90 tablet 3   bacitracin-polymyxin b (POLYSPORIN) ophthalmic ointment Place 1 application into the right eye 3 (three) times daily. apply to eye every 12 hours while awake, x 7 days 3.5 g 0   Blood Glucose Monitoring Suppl (ONE TOUCH ULTRA SYSTEM KIT) w/Device KIT For testing blood sugars daily Dx: E11.9 1 each 0   busPIRone (BUSPAR) 10 MG tablet Take 1 tablet (10 mg total) by mouth daily. 30 tablet 1   glucose  blood (ONE TOUCH ULTRA TEST) test strip USE 1 STRIP TO CHECK GLUCOSE ONCE DAILY 100 each 0   meloxicam (MOBIC) 15 MG tablet One tab PO qAM with a meal for 2 weeks, then daily prn pain. 30 tablet 3   Multiple Vitamins-Minerals (CENTRUM ADULTS PO) Take 1 tablet by mouth daily.     OneTouch Delica Lancets 76H MISC 1 strip by Other route in the morning. 100 each PRN   sertraline (ZOLOFT) 100 MG tablet Take 1 tablet by mouth once daily 90 tablet 0   VOLTAREN 1 % GEL   5   No current facility-administered medications on file prior to visit.      Family History: Reviewed  Family  History  Problem Relation Age of Onset   Lung cancer Father    Kidney disease Mother    Colon cancer Neg Hx    Esophageal cancer Neg Hx    Rectal cancer Neg Hx    Stomach cancer Neg Hx     Psychiatric specialty examination:  Objective: Appearance:   Eye Contact::   Speech: Clear and Coherent and Normal Rate   Volume: Normal   Mood: fair  Affect:   Thought Process: Coherent, Logical and Loose   Orientation: Full   Thought Content: WDL   Suicidal Thoughts: No   Homicidal Thoughts: No   Judgement: Fair   Insight: Fair   Psychomotor Activity: Normal   Akathisia: No   Handed: Right   Memory: Immediate 3/3, recent 3/3   Concentration: Good   AIMS (if indicated): Not Indicated   Assets: Surveyor, mining  Housing  Social Support   Prior documentation reviewed  Assessment:  AXIS I  Major Depressive Disorder, recurrent, moderate. GAD. Insomnia   Treatment Plan/Recommendations:          Medications:  Anxiety:doing fair on zoloft, continue and buspar prn  Prior documentation reviewed  Major depression: stable, continue zoloft,  Insomnia:doing fair , continue trazadone Fu 48m Call for refills when due           NMerian Capron MD  08/26/2021 12:40 PM

## 2021-09-01 ENCOUNTER — Ambulatory Visit (INDEPENDENT_AMBULATORY_CARE_PROVIDER_SITE_OTHER): Payer: Medicare Other | Admitting: Family Medicine

## 2021-09-01 ENCOUNTER — Encounter: Payer: Self-pay | Admitting: Family Medicine

## 2021-09-01 ENCOUNTER — Other Ambulatory Visit: Payer: Self-pay

## 2021-09-01 VITALS — BP 153/73 | HR 73 | Temp 97.8°F | Ht 68.0 in | Wt 172.1 lb

## 2021-09-01 DIAGNOSIS — R0600 Dyspnea, unspecified: Secondary | ICD-10-CM

## 2021-09-01 LAB — BASIC METABOLIC PANEL
BUN: 17 mg/dL (ref 7–25)
CO2: 33 mmol/L — ABNORMAL HIGH (ref 20–32)
Calcium: 10.2 mg/dL (ref 8.6–10.3)
Chloride: 101 mmol/L (ref 98–110)
Creat: 0.92 mg/dL (ref 0.70–1.28)
Glucose, Bld: 127 mg/dL — ABNORMAL HIGH (ref 65–99)
Potassium: 4.4 mmol/L (ref 3.5–5.3)
Sodium: 140 mmol/L (ref 135–146)

## 2021-09-01 LAB — CBC WITH DIFFERENTIAL/PLATELET
Absolute Monocytes: 392 cells/uL (ref 200–950)
Basophils Absolute: 17 cells/uL (ref 0–200)
Basophils Relative: 0.2 %
Eosinophils Absolute: 278 cells/uL (ref 15–500)
Eosinophils Relative: 3.2 %
HCT: 50.6 % — ABNORMAL HIGH (ref 38.5–50.0)
Hemoglobin: 16.7 g/dL (ref 13.2–17.1)
Lymphs Abs: 1644 cells/uL (ref 850–3900)
MCH: 29.2 pg (ref 27.0–33.0)
MCHC: 33 g/dL (ref 32.0–36.0)
MCV: 88.6 fL (ref 80.0–100.0)
MPV: 10.9 fL (ref 7.5–12.5)
Monocytes Relative: 4.5 %
Neutro Abs: 6368 cells/uL (ref 1500–7800)
Neutrophils Relative %: 73.2 %
Platelets: 214 10*3/uL (ref 140–400)
RBC: 5.71 10*6/uL (ref 4.20–5.80)
RDW: 13.6 % (ref 11.0–15.0)
Total Lymphocyte: 18.9 %
WBC: 8.7 10*3/uL (ref 3.8–10.8)

## 2021-09-01 LAB — D-DIMER, QUANTITATIVE: D-Dimer, Quant: 0.3 mcg/mL FEU (ref <0.50)

## 2021-09-01 NOTE — Progress Notes (Signed)
Nathan [name] - 70 y.o. male MRN [id number]  Date of birth: [birth year]  Subjective Chief Complaint  Patient presents with   hyperventilation    HPI Nathan [name] is a 70 year old male here today with complaint of shortness of breath.  Reports that symptoms started approximately 3 days ago.  He did have episode of dizziness and vomiting at initial onset.  This only occurred once.  Since that time he has had feeling that he cannot take a deep breath at times.  He feels anxious as well.  He does have a history of anxiety and is seeing psychiatry for this.  Currently treated with sertraline and BuSpar.  He feels like BuSpar has not been very effective so far.  He denies chest pain, palpitations, leg pain, fever or chills.  ROS:  A comprehensive ROS was completed and negative except as noted per HPI  Allergies  Allergen Reactions   Other Rash   Adhesive [Tape]     Hives on area wear tape applied   Hydrocodone-Acetaminophen Itching   Hydrocodone Rash    Past Medical History:  Diagnosis Date   Adenomatous colon polyp    Allergy    Anxiety    CRPS (complex regional pain syndrome), upper limb    Depression    Diabetes mellitus without complication ([place])    Diverticulitis 2013   Diverticulosis    Diverticulosis    GERD (gastroesophageal reflux disease)    Glaucoma    History of Helicobacter pylori infection    Hyperlipidemia    Hypertension    no per pt   Intestinal metaplasia of gastric mucosa    Ligament tear of upper extremity April 2008   Left elbow   Nephrolithiasis    Renal calculi    Renal disorder     Past Surgical History:  Procedure Laterality Date   LITHOTRIPSY  2004   kidney stones   REPLACEMENT TOTAL KNEE Right 2014   ROTATOR CUFF REPAIR Left 2011   ULNAR NERVE REPAIR Left 2009, 2010,   3 surgeries    Social History   Socioeconomic History   Marital status: Married    Spouse name: Not on file   Number of children: 1   Years of education: 16   Highest  education level: Bachelor's degree (e.g., BA, AB, BS)  Occupational History   Occupation: AIRCRAFT INT [employer]    Employer: [employer]    Comment: Retired.   Tobacco Use   Smoking status: Former    Packs/day: 1.00    Years: 10.00    Pack years: 10.00    Types: Cigarettes    Quit date: 09/25/1981    Years since quitting: 39.9   Smokeless tobacco: Never  Vaping Use   Vaping Use: Never used  Substance and Sexual Activity   Alcohol use: Yes    Alcohol/week: 1.0 standard drink    Types: 1 Cans of beer per week    Comment: 1-2 per wk   Drug use: No   Sexual activity: Yes    Partners: Female  Other Topics Concern   Not on file  Social History Narrative   Some exercise. Caffeine daily. Plays chess to keep brain stimulated   Social Determinants of Health   Financial Resource Strain: Not on file  Food Insecurity: Not on file  Transportation Needs: Not on file  Physical Activity: Not on file  Stress: Not on file  Social Connections: Not on file    Family History  Problem Relation Age of Onset  Lung cancer Father    Kidney disease Mother    Colon cancer Neg Hx    Esophageal cancer Neg Hx    Rectal cancer Neg Hx    Stomach cancer Neg Hx     Health Maintenance  Topic Date Due   Zoster Vaccines- Shingrix (1 of 2) Never done   COLONOSCOPY (Pts 45-10yrs Insurance coverage will need to be confirmed)  02/01/2020   OPHTHALMOLOGY EXAM  01/22/2021   URINE MICROALBUMIN  10/01/2021   COVID-19 Vaccine (3 - Booster for Moderna series) 10/01/2021 (Originally 04/19/2020)   INFLUENZA VACCINE  12/23/2021 (Originally 04/25/2021)   FOOT EXAM  10/01/2021   HEMOGLOBIN A1C  11/14/2021   TETANUS/TDAP  07/24/2028   Pneumonia Vaccine 44+ Years old  Completed   Hepatitis C Screening  Completed   HPV VACCINES  Aged Out      ----------------------------------------------------------------------------------------------------------------------------------------------------------------------------------------------------------------- Physical Exam BP (!) 153/73 (BP Location: Left Arm, Patient Position: Sitting, Cuff Size: Normal)   Pulse 73   Temp 97.8 F (36.6 C)   Ht 5\' 8"  (1.727 m)   Wt 172 lb 1.6 oz (78.1 kg)   SpO2 99%   BMI 26.17 kg/m   Physical Exam Constitutional:      Appearance: Normal appearance.  HENT:     Head: Normocephalic and atraumatic.  Eyes:     General: No scleral icterus. Cardiovascular:     Rate and Rhythm: Normal rate and regular rhythm.  Musculoskeletal:     Cervical back: Neck supple.  Skin:    General: Skin is warm and dry.  Neurological:     General: No focal deficit present.     Mental Status: He is alert.  Psychiatric:        Mood and Affect: Mood normal.        Behavior: Behavior normal.    ------------------------------------------------------------------------------------------------------------------------------------------------------------------------------------------------------------------- Assessment and Plan  Dyspnea EKG completed today and no changes from previous tracing in 2018.  Checking BMP, CBC and D-dimer today.  Discussed that this may be part of his anxiety as well.  Recommend he increase BuSpar to 3 times daily over the next couple of days.  Follow-up with psychiatry.   No orders of the defined types were placed in this encounter.   No follow-ups on file.    This visit occurred during the SARS-CoV-2 public health emergency.  Safety protocols were in place, including screening questions prior to the visit, additional usage of staff PPE, and extensive cleaning of exam room while observing appropriate contact time as indicated for disinfecting solutions.

## 2021-09-01 NOTE — Assessment & Plan Note (Signed)
EKG completed today and no changes from previous tracing in 2018.  Checking BMP, CBC and D-dimer today.  Discussed that this may be part of his anxiety as well.  Recommend he increase BuSpar to 3 times daily over the next couple of days.  Follow-up with psychiatry.

## 2021-09-01 NOTE — Patient Instructions (Signed)
EKG looks ok.   Try increase in buspar to three times per day over the next few days.  We'll be in touch with lab results.

## 2021-09-23 ENCOUNTER — Other Ambulatory Visit: Payer: Self-pay | Admitting: Family Medicine

## 2021-09-23 ENCOUNTER — Other Ambulatory Visit: Payer: Self-pay | Admitting: *Deleted

## 2021-09-23 ENCOUNTER — Other Ambulatory Visit (HOSPITAL_COMMUNITY): Payer: Self-pay | Admitting: Psychiatry

## 2021-09-23 DIAGNOSIS — E785 Hyperlipidemia, unspecified: Secondary | ICD-10-CM

## 2021-10-22 ENCOUNTER — Other Ambulatory Visit (HOSPITAL_COMMUNITY): Payer: Self-pay | Admitting: Psychiatry

## 2021-10-25 ENCOUNTER — Encounter: Payer: Self-pay | Admitting: Physician Assistant

## 2021-10-25 ENCOUNTER — Telehealth (INDEPENDENT_AMBULATORY_CARE_PROVIDER_SITE_OTHER): Payer: Medicare Other | Admitting: Physician Assistant

## 2021-10-25 VITALS — Temp 98.2°F | Ht 66.0 in | Wt 170.0 lb

## 2021-10-25 DIAGNOSIS — J069 Acute upper respiratory infection, unspecified: Secondary | ICD-10-CM | POA: Diagnosis not present

## 2021-10-25 MED ORDER — BENZONATATE 200 MG PO CAPS: 200.0000 mg | ORAL_CAPSULE | Freq: Three times a day (TID) | ORAL | 0 refills | Status: DC | PRN

## 2021-10-25 NOTE — Progress Notes (Deleted)
Flu like symptoms started Sunday took a rapid covid  test came back negative no fever , pt states feverish and cough and mucus yellow, headaches and sore throat

## 2021-10-25 NOTE — Progress Notes (Signed)
..Virtual Visit via Video Note  I connected with [facility] on 10/25/21 at  3:00 PM EST by a video enabled telemedicine application and verified that I am speaking with the correct person using two identifiers.  Location: Patient: home Provider: clinic  .Marland KitchenParticipating in visit:  Patient: Nathan [name] Provider: Iran Planas PA-C Provider in training: Judithann Sheen PA-S    I discussed the limitations of evaluation and management by telemedicine and the availability of in person appointments. The patient expressed understanding and agreed to proceed.  History of Present Illness: Pt is a 71 yo male with T2DM who calls in with flu like symptoms that started 3 days ago. Rapid home covid test was negative. He has 2 covid vaccines. Pt feels hot but does not have temperature. No SOB. He does have a cough that is wet to dry. He is blowing yellow phelgm out of nose. He has a ST. He is taking robitussin and helps with symptoms. No sick contacts.   .. Active Ambulatory Problems    Diagnosis Date Noted   Hyperlipidemia 06/28/2007   ANEMIA 05/11/2010   CAUSALGIA OF UPPER LIMB 08/02/2010   NEUROPATHY 08/10/2009   ALLERGIC RHINITIS 01/14/2008   GERD 06/23/2008   FATTY LIVER DISEASE 06/28/2007   NEPHROLITHIASIS 05/22/2009   DISEASE, CYSTIC KIDNEY NOS, CONGENITAL 06/28/2007   Major depressive disorder, recurrent ([place]) 04/12/2012   Diabetes mellitus, type II ([place]) 12/08/2016   Pain syndrome, chronic 02/06/2014   Osteoarthritis of left knee 01/05/2020   Primary open angle glaucoma (POAG) of right eye, mild stage 01/29/2020   Pain in extremity 02/06/2014   Complete tear of left rotator cuff 08/02/2011   Combined forms of age-related cataract of left eye 01/22/2020   Bilateral rotator cuff dysfunction 02/06/2014   Dyspnea 09/01/2021   Resolved Ambulatory Problems    Diagnosis Date Noted   MDD (major depressive disorder) 06/17/2007   HYPERTENSION, BENIGN, MILD 08/02/2010   Headache(784.0)  06/23/2008   SINUSITIS - ACUTE-NOS 12/01/2010   IFG (impaired fasting glucose) 05/27/2014   Past Medical History:  Diagnosis Date   Adenomatous colon polyp    Allergy    Anxiety    CRPS (complex regional pain syndrome), upper limb    Depression    Diabetes mellitus without complication ([place])    Diverticulitis 2013   Diverticulosis    Diverticulosis    GERD (gastroesophageal reflux disease)    Glaucoma    History of Helicobacter pylori infection    Hypertension    Intestinal metaplasia of gastric mucosa    Ligament tear of upper extremity April 2008   Nephrolithiasis    Renal calculi    Renal disorder       Observations/Objective: No acute distress Normal breathing Normal appearance No wheezing.   .. Today's Vitals   10/25/21 1456  Temp: 98.2 F (36.8 C)  Weight: 170 lb (77.1 kg)  Height: 5\' 6"  (1.676 m)   Body mass index is 27.44 kg/m.    Assessment and Plan: Marland KitchenMarland KitchenFrancisco was seen today for cough.  Diagnoses and all orders for this visit:  Viral URI with cough -     benzonatate (TESSALON) 200 MG capsule; Take 1 capsule (200 mg total) by mouth 3 (three) times daily as needed.  Home covid negative.  Out of window to be tested and treated for flu Symptoms seem viral Discussed OTC viral care Tessalon pearls sent for cough Rest and hydrate Most viruses last 4-7 days    Follow Up Instructions:    I  discussed the assessment and treatment plan with the patient. The patient was provided an opportunity to ask questions and all were answered. The patient agreed with the plan and demonstrated an understanding of the instructions.   The patient was advised to call back or seek an in-person evaluation if the symptoms worsen or if the condition fails to improve as anticipated.    Iran Planas, PA-C

## 2021-10-25 NOTE — Patient Instructions (Signed)

## 2021-10-29 ENCOUNTER — Other Ambulatory Visit: Payer: Self-pay

## 2021-10-29 ENCOUNTER — Emergency Department (INDEPENDENT_AMBULATORY_CARE_PROVIDER_SITE_OTHER)
Admission: EM | Admit: 2021-10-29 | Discharge: 2021-10-29 | Disposition: A | Discharge status: Home / Self Care | Payer: Medicare Other | Source: Home / Self Care | Attending: Family Medicine | Admitting: Family Medicine

## 2021-10-29 DIAGNOSIS — J209 Acute bronchitis, unspecified: Secondary | ICD-10-CM | POA: Diagnosis not present

## 2021-10-29 LAB — POCT INFLUENZA A/B
Influenza A, POC: NEGATIVE
Influenza B, POC: NEGATIVE

## 2021-10-29 LAB — POC SARS CORONAVIRUS 2 AG -  ED: SARS Coronavirus 2 Ag: NEGATIVE

## 2021-10-29 MED ORDER — PREDNISONE 20 MG PO TABS: 20.0000 mg | ORAL_TABLET | Freq: Two times a day (BID) | ORAL | 0 refills | Status: DC

## 2021-10-29 MED ORDER — AZITHROMYCIN 250 MG PO TABS: 250.0000 mg | ORAL_TABLET | Freq: Every day | ORAL | 0 refills | Status: DC

## 2021-10-29 NOTE — Discharge Instructions (Signed)
Drink more water Take Mucinex DM 2 times a day to help loosen the mucus Take antibiotic as prescribed.  Azithromycin 2 pills today, then 1 a day until gone Take the prednisone 2 times a day for 5 days.  This will help reduce the congestion and postnasal drip Call your doctor if not improving by the end of the week

## 2021-10-29 NOTE — ED Triage Notes (Signed)
Pt states that he has a cough, sneezing and sore throat. X1 week  Pt states that he had negative covid test on 2/2.  Pt states that he is vaccinated for covid. Pt states that he hasn't had flu vaccine.

## 2021-10-29 NOTE — ED Provider Notes (Signed)
Nathan [name] [facility]    CSN: [id number] Arrival date & time: 10/29/21  1308      History   Chief Complaint Chief Complaint  Patient presents with   Cough    Cough, sneezing, and sore throat x1 week    HPI Nathan [name] is a 71 y.o. male.   HPI  [name] had some sore throat cough and sneezing for about a week.  He states that he feels thick mucus in his chest that is hard to cough up.  When he can cough it up its thick and yellow.  States he is feeling tired.  No shortness of breath.  No chest pain.  No fever or chills.  COVID testing and flu testing done at this visit were negative  No underlying lung disease.  No history of smoking  Past Medical History:  Diagnosis Date   Adenomatous colon polyp    Allergy    Anxiety    CRPS (complex regional pain syndrome), upper limb    Depression    Diabetes mellitus without complication ([place])    Diverticulitis 2013   Diverticulosis    Diverticulosis    GERD (gastroesophageal reflux disease)    Glaucoma    History of Helicobacter pylori infection    Hyperlipidemia    Hypertension    no per pt   Intestinal metaplasia of gastric mucosa    Ligament tear of upper extremity April 2008   Left elbow   Nephrolithiasis    Renal calculi    Renal disorder     Patient Active Problem List   Diagnosis Date Noted   Dyspnea 09/01/2021   Primary open angle glaucoma (POAG) of right eye, mild stage 01/29/2020   Combined forms of age-related cataract of left eye 01/22/2020   Osteoarthritis of left knee 01/05/2020   Diabetes mellitus, type II ([place]) 12/08/2016   Pain syndrome, chronic 02/06/2014   Pain in extremity 02/06/2014   Bilateral rotator cuff dysfunction 02/06/2014   Major depressive disorder, recurrent ([place]) 04/12/2012   Complete tear of left rotator cuff 08/02/2011   CAUSALGIA OF UPPER LIMB 08/02/2010   ANEMIA 05/11/2010   NEUROPATHY 08/10/2009   NEPHROLITHIASIS 05/22/2009   GERD 06/23/2008   ALLERGIC RHINITIS  01/14/2008   Hyperlipidemia 06/28/2007   FATTY LIVER DISEASE 06/28/2007   DISEASE, CYSTIC KIDNEY NOS, CONGENITAL 06/28/2007    Past Surgical History:  Procedure Laterality Date   LITHOTRIPSY  2004   kidney stones   REPLACEMENT TOTAL KNEE Right 2014   ROTATOR CUFF REPAIR Left 2011   ULNAR NERVE REPAIR Left 2009, 2010,   3 surgeries       Home Medications    Prior to Admission medications   Medication Sig Start Date End Date Taking? Authorizing Provider  atorvastatin (LIPITOR) 40 MG tablet Take 1 tablet (40 mg total) by mouth daily. LAST REFILL MUST HAVE LABS DONE FOR FUTURE REFILLS 09/23/21  Yes Hali Marry, MD  azithromycin (ZITHROMAX) 250 MG tablet Take 1 tablet (250 mg total) by mouth daily. Take first 2 tablets together, then 1 every day until finished. 10/29/21  Yes Raylene Everts, MD  benzonatate (TESSALON) 200 MG capsule Take 1 capsule (200 mg total) by mouth 3 (three) times daily as needed. 10/25/21  Yes Breeback, Jade L, PA-C  Blood Glucose Monitoring Suppl (ONE TOUCH ULTRA SYSTEM KIT) w/Device KIT For testing blood sugars daily Dx: E11.9 12/08/16  Yes Metheney, Rene Kocher, MD  busPIRone (BUSPAR) 10 MG tablet Take 1 tablet by mouth  once daily 10/24/21  Yes [name], MD  glucose blood (ONE TOUCH ULTRA TEST) test strip USE 1 STRIP TO CHECK GLUCOSE ONCE DAILY 01/05/20  Yes Hali Marry, MD  meloxicam (MOBIC) 15 MG tablet One tab PO qAM with a meal for 2 weeks, then daily prn pain. 06/28/21  Yes Silverio Decamp, MD  Multiple Vitamins-Minerals (CENTRUM ADULTS PO) Take 1 tablet by mouth daily.   Yes [provider]  OneTouch Delica Lancets 75F MISC 1 strip by Other route in the morning. 01/05/20  Yes Hali Marry, MD  predniSONE (DELTASONE) 20 MG tablet Take 1 tablet (20 mg total) by mouth 2 (two) times daily with a meal. 10/29/21  Yes Raylene Everts, MD  sertraline (ZOLOFT) 100 MG tablet Take 1 tablet by mouth once daily 09/23/21   Yes [name], MD  traZODone (DESYREL) 50 MG tablet TAKE 1 TABLET BY MOUTH AT BEDTIME 09/23/21  Yes [name], MD  VOLTAREN 1 % GEL  12/30/14  Yes [provider]    Family History Family History  Problem Relation Age of Onset   Lung cancer Father    Kidney disease Mother    Colon cancer Neg Hx    Esophageal cancer Neg Hx    Rectal cancer Neg Hx    Stomach cancer Neg Hx     Social History Social History   Tobacco Use   Smoking status: Former    Packs/day: 1.00    Years: 10.00    Pack years: 10.00    Types: Cigarettes    Quit date: 09/25/1981    Years since quitting: 40.1   Smokeless tobacco: Never  Vaping Use   Vaping Use: Never used  Substance Use Topics   Alcohol use: Not Currently    Alcohol/week: 1.0 standard drink    Types: 1 Cans of beer per week    Comment: 1-2 per wk   Drug use: No     Allergies   Adhesive [tape] and Hydrocodone-acetaminophen   Review of Systems Review of Systems  See HPI Physical Exam Triage Vital Signs ED Triage Vitals  Enc Vitals Group     BP 10/29/21 1322 (!) 173/82     Pulse Rate 10/29/21 1322 73     Resp 10/29/21 1322 18     Temp 10/29/21 1322 98.2 F (36.8 C)     Temp Source 10/29/21 1322 Oral     SpO2 10/29/21 1322 96 %     Weight 10/29/21 1321 168 lb (76.2 kg)     Height 10/29/21 1321 _0  (1.676 m)     Head Circumference --      Peak Flow --      Pain Score 10/29/21 1320 6     Pain Loc --      Pain Edu? --      Excl. in [place]? --    No data found.  Updated Vital Signs BP (!) 173/82 (BP Location: Right Arm)    Pulse 73    Temp 98.2 F (36.8 C) (Oral)    Resp 18    Ht _1  (1.676 m)    Wt 76.2 kg    SpO2 96%    BMI 27.12 kg/m       Physical Exam Constitutional:      General: He is not in acute distress.    Appearance: Normal appearance. He is well-developed.  HENT:     Head: Normocephalic and atraumatic.     Right Ear:  Tympanic membrane and ear canal normal.     Left Ear: Tympanic  membrane and ear canal normal.     Nose: Nose normal. No rhinorrhea.     Mouth/Throat:     Pharynx: Posterior oropharyngeal erythema present.     Comments: Thick postnasal drip Eyes:     Conjunctiva/sclera: Conjunctivae normal.     Pupils: Pupils are equal, round, and reactive to light.  Cardiovascular:     Rate and Rhythm: Normal rate and regular rhythm.     Heart sounds: Normal heart sounds.  Pulmonary:     Effort: Pulmonary effort is normal. No respiratory distress.     Breath sounds: Rhonchi present.     Comments: Few central rhonchi Abdominal:     General: There is no distension.     Palpations: Abdomen is soft.  Musculoskeletal:        General: Normal range of motion.     Cervical back: Normal range of motion.  Lymphadenopathy:     Cervical: Cervical adenopathy present.  Skin:    General: Skin is warm and dry.  Neurological:     Mental Status: He is alert.  Psychiatric:        Mood and Affect: Mood normal.        Behavior: Behavior normal.     UC Treatments / Results  Labs (all labs ordered are listed, but only abnormal results are displayed) Labs Reviewed  POCT INFLUENZA A/B - Normal  POC SARS CORONAVIRUS 2 AG -  ED - Normal    EKG   Radiology No results found.  Procedures Procedures (including critical care time)  Medications Ordered in UC Medications - No data to display  Initial Impression / Assessment and Plan / UC Course  I have reviewed the triage vital signs and the nursing notes.  Pertinent labs & imaging results that were available during my care of the patient were reviewed by me and considered in my medical decision making (see chart for details).     Final Clinical Impressions(s) / UC Diagnoses   Final diagnoses:  Acute bronchitis, unspecified organism     Discharge Instructions      Drink more water Take Mucinex DM 2 times a day to help loosen the mucus Take antibiotic as prescribed.  Azithromycin 2 pills today, then 1 a day  until gone Take the prednisone 2 times a day for 5 days.  This will help reduce the congestion and postnasal drip Call your doctor if not improving by the end of the week   ED Prescriptions     Medication Sig Dispense Auth. Provider   azithromycin (ZITHROMAX) 250 MG tablet Take 1 tablet (250 mg total) by mouth daily. Take first 2 tablets together, then 1 every day until finished. 6 tablet Raylene Everts, MD   predniSONE (DELTASONE) 20 MG tablet Take 1 tablet (20 mg total) by mouth 2 (two) times daily with a meal. 10 tablet Raylene Everts, MD      PDMP not reviewed this encounter.   Raylene Everts, MD 10/29/21 [phone]

## 2021-11-21 ENCOUNTER — Other Ambulatory Visit (HOSPITAL_COMMUNITY): Payer: Self-pay | Admitting: Psychiatry

## 2021-11-21 ENCOUNTER — Other Ambulatory Visit: Payer: Self-pay | Admitting: Sports Medicine

## 2021-11-21 DIAGNOSIS — M67911 Unspecified disorder of synovium and tendon, right shoulder: Secondary | ICD-10-CM

## 2021-11-21 DIAGNOSIS — M67912 Unspecified disorder of synovium and tendon, left shoulder: Secondary | ICD-10-CM

## 2021-11-22 ENCOUNTER — Other Ambulatory Visit: Payer: Self-pay

## 2021-11-22 ENCOUNTER — Encounter: Payer: Self-pay | Admitting: Family Medicine

## 2021-11-22 ENCOUNTER — Ambulatory Visit (INDEPENDENT_AMBULATORY_CARE_PROVIDER_SITE_OTHER): Payer: Medicare Other | Admitting: Family Medicine

## 2021-11-22 VITALS — BP 144/70 | HR 74 | Resp 16 | Ht 66.0 in | Wt 173.0 lb

## 2021-11-22 DIAGNOSIS — R21 Rash and other nonspecific skin eruption: Secondary | ICD-10-CM | POA: Diagnosis not present

## 2021-11-22 DIAGNOSIS — E119 Type 2 diabetes mellitus without complications: Secondary | ICD-10-CM | POA: Diagnosis not present

## 2021-11-22 DIAGNOSIS — R809 Proteinuria, unspecified: Secondary | ICD-10-CM | POA: Diagnosis not present

## 2021-11-22 DIAGNOSIS — L299 Pruritus, unspecified: Secondary | ICD-10-CM | POA: Diagnosis not present

## 2021-11-22 MED ORDER — LEVOCETIRIZINE DIHYDROCHLORIDE 5 MG PO TABS: 5.0000 mg | ORAL_TABLET | Freq: Every evening | ORAL | 2 refills | Status: DC

## 2021-11-22 MED ORDER — TRIAMCINOLONE ACETONIDE 0.1 % EX CREA: 1.0000 "application " | TOPICAL_CREAM | Freq: Two times a day (BID) | CUTANEOUS | 1 refills | Status: DC

## 2021-11-22 NOTE — Progress Notes (Signed)
Acute Office Visit  Subjective:    Patient ID: Nathan [name], male    DOB: 08/23/1951, 71 y.o.   MRN: [id number]  Chief Complaint  Patient presents with   Rash    All over body for 1 week, itches    Diabetes Eye Exam    Patient will call to schedule appointment    Colonoscopy    Patient will call to schedule colonoscopy     HPI Patient is in today for itching on both outer arms and his neck and sometimes the lower half of his face for about 2 weeks.  It started about a week after he had a respiratory infection.  He says he is tried changing soaps.  And quit using his shampoo just to see if it make a difference but it has not helped.  He did pick up some over-the-counter hydrocortisone 1% cream and says that helps a little.  He says if skin just feels more rough but he has not noticed a discrete rash.  No other changes to soaps detergents perfumes etc.  Denies any shortness of breath or respiratory symptoms.  He did have an upper respiratory infection about 3 weeks ago.  Did with azithromycin and prednisone.  Past Medical History:  Diagnosis Date   Adenomatous colon polyp    Allergy    Anxiety    CRPS (complex regional pain syndrome), upper limb    Depression    Diabetes mellitus without complication ([place])    Diverticulitis 2013   Diverticulosis    Diverticulosis    GERD (gastroesophageal reflux disease)    Glaucoma    History of Helicobacter pylori infection    Hyperlipidemia    Hypertension    no per pt   Intestinal metaplasia of gastric mucosa    Ligament tear of upper extremity April 2008   Left elbow   Nephrolithiasis    Renal calculi    Renal disorder     Past Surgical History:  Procedure Laterality Date   LITHOTRIPSY  2004   kidney stones   REPLACEMENT TOTAL KNEE Right 2014   ROTATOR CUFF REPAIR Left 2011   ULNAR NERVE REPAIR Left 2009, 2010,   3 surgeries    Family History  Problem Relation Age of Onset   Lung cancer Father    Kidney disease  Mother    Colon cancer Neg Hx    Esophageal cancer Neg Hx    Rectal cancer Neg Hx    Stomach cancer Neg Hx     Social History   Socioeconomic History   Marital status: Married    Spouse name: Not on file   Number of children: 1   Years of education: 16   Highest education level: Bachelor's degree (e.g., BA, AB, BS)  Occupational History   Occupation: AIRCRAFT INT [employer]    Employer: [employer]    Comment: Retired.   Tobacco Use   Smoking status: Former    Packs/day: 1.00    Years: 10.00    Pack years: 10.00    Types: Cigarettes    Quit date: 09/25/1981    Years since quitting: 40.1   Smokeless tobacco: Never  Vaping Use   Vaping Use: Never used  Substance and Sexual Activity   Alcohol use: Not Currently    Alcohol/week: 1.0 standard drink    Types: 1 Cans of beer per week    Comment: 1-2 per wk   Drug use: No   Sexual activity: Yes    Partners:  Female  Other Topics Concern   Not on file  Social History Narrative   Some exercise. Caffeine daily. Plays chess to keep brain stimulated   Social Determinants of Health   Financial Resource Strain: Not on file  Food Insecurity: Not on file  Transportation Needs: Not on file  Physical Activity: Not on file  Stress: Not on file  Social Connections: Not on file  Intimate Partner Violence: Not on file    Outpatient Medications Prior to Visit  Medication Sig Dispense Refill   atorvastatin (LIPITOR) 40 MG tablet Take 1 tablet (40 mg total) by mouth daily. LAST REFILL MUST HAVE LABS DONE FOR FUTURE REFILLS 90 tablet 0   Blood Glucose Monitoring Suppl (ONE TOUCH ULTRA SYSTEM KIT) w/Device KIT For testing blood sugars daily Dx: E11.9 1 each 0   busPIRone (BUSPAR) 10 MG tablet Take 1 tablet by mouth once daily 30 tablet 2   glucose blood (ONE TOUCH ULTRA TEST) test strip USE 1 STRIP TO CHECK GLUCOSE ONCE DAILY 100 each 0   meloxicam (MOBIC) 15 MG tablet TAKE 1 TABLET BY MOUTH ONCE DAILY IN THE MORNING WITH A MEAL FOR 2 WEEKS THEN  AS NEEDED FOR PAIN 30 tablet 0   Multiple Vitamins-Minerals (CENTRUM ADULTS PO) Take 1 tablet by mouth daily.     OneTouch Delica Lancets 85U MISC 1 strip by Other route in the morning. 100 each PRN   sertraline (ZOLOFT) 100 MG tablet Take 1 tablet by mouth once daily 90 tablet 0   traZODone (DESYREL) 50 MG tablet TAKE 1 TABLET BY MOUTH AT BEDTIME 90 tablet 0   VOLTAREN 1 % GEL   5   azithromycin (ZITHROMAX) 250 MG tablet Take 1 tablet (250 mg total) by mouth daily. Take first 2 tablets together, then 1 every day until finished. 6 tablet 0   benzonatate (TESSALON) 200 MG capsule Take 1 capsule (200 mg total) by mouth 3 (three) times daily as needed. 30 capsule 0   predniSONE (DELTASONE) 20 MG tablet Take 1 tablet (20 mg total) by mouth 2 (two) times daily with a meal. 10 tablet 0   No facility-administered medications prior to visit.    Allergies  Allergen Reactions   Adhesive [Tape]     Hives on area wear tape applied   Hydrocodone-Acetaminophen Itching    Review of Systems     Objective:    Physical Exam Vitals reviewed.  Constitutional:      Appearance: He is well-developed.  HENT:     Head: Normocephalic and atraumatic.  Eyes:     Conjunctiva/sclera: Conjunctivae normal.  Cardiovascular:     Rate and Rhythm: Normal rate.  Pulmonary:     Effort: Pulmonary effort is normal.  Skin:    General: Skin is dry.     Coloration: Skin is not pale.     Comments: No discrete rash such as hives but the skin does feel a little bit more bumpy and there are few erythematous places.  No discrete lesions.  Neurological:     Mental Status: He is alert and oriented to person, place, and time.  Psychiatric:        Behavior: Behavior normal.    BP (!) 144/70    Pulse 74    Resp 16    Ht '5\' 6"'  (1.676 m)    Wt 173 lb (78.5 kg)    SpO2 95%    BMI 27.92 kg/m  Wt Readings from Last 3 Encounters:  11/22/21 173  lb (78.5 kg)  10/29/21 168 lb (76.2 kg)  10/25/21 170 lb (77.1 kg)    Health  Maintenance Due  Topic Date Due   COLONOSCOPY (Pts 45-66yr Insurance coverage will need to be confirmed)  02/01/2020   OPHTHALMOLOGY EXAM  01/22/2021   FOOT EXAM  10/01/2021   URINE MICROALBUMIN  10/01/2021   HEMOGLOBIN A1C  11/14/2021    There are no preventive care reminders to display for this patient.   Lab Results  Component Value Date   TSH 2.253 12/27/2011   Lab Results  Component Value Date   WBC 8.7 09/01/2021   HGB 16.7 09/01/2021   HCT 50.6 (H) 09/01/2021   MCV 88.6 09/01/2021   PLT 214 09/01/2021   Lab Results  Component Value Date   NA 140 09/01/2021   K 4.4 09/01/2021   CO2 33 (H) 09/01/2021   GLUCOSE 127 (H) 09/01/2021   BUN 17 09/01/2021   CREATININE 0.92 09/01/2021   BILITOT 0.9 01/12/2020   ALKPHOS 105 06/14/2016   AST 20 01/12/2020   ALT 21 01/12/2020   PROT 7.1 01/12/2020   ALBUMIN 4.1 06/14/2016   CALCIUM 10.2 09/01/2021   Lab Results  Component Value Date   CHOL 130 01/12/2020   Lab Results  Component Value Date   HDL 44 01/12/2020   Lab Results  Component Value Date   LDLCALC 61 01/12/2020   Lab Results  Component Value Date   TRIG 175 (H) 01/12/2020   Lab Results  Component Value Date   CHOLHDL 3.0 01/12/2020   Lab Results  Component Value Date   HGBA1C 6.0 05/14/2021       Assessment & Plan:   Problem List Items Addressed This Visit       Endocrine   Diabetes mellitus, type II ([place]   Relevant Orders   HgB A1c   Urine Microalbumin w/creat. ratio   Other Visit Diagnoses     Rash    -  Primary   Itch of skin       Relevant Medications   levocetirizine (XYZAL) 5 MG tablet   triamcinolone cream (KENALOG) 0.1 %   Other Relevant Orders   COMPLETE METABOLIC PANEL WITH GFR   CBC with Differential/Platelet      Unclear etiology.  The skin just feels a little rough texture but I do not see any discrete hives.  We will treat with topical triamcinolone and add an oral antihistamine.  Also check a CBC with  differential to see if eosinophils are elevated and make sure that liver function is normal as well.  He is also not had an A1c in about 6 months so I do want to check that again today just to make sure that he has not had a significant bump in his glucose levels that could be contributing as well.  Meds ordered this encounter  Medications   levocetirizine (XYZAL) 5 MG tablet    Sig: Take 1 tablet (5 mg total) by mouth every evening.    Dispense:  30 tablet    Refill:  2   triamcinolone cream (KENALOG) 0.1 %    Sig: Apply 1 application topically 2 (two) times daily.    Dispense:  45 g    Refill:  1     CBeatrice Lecher MD

## 2021-11-23 LAB — CBC WITH DIFFERENTIAL/PLATELET
Absolute Monocytes: 480 cells/uL (ref 200–950)
Basophils Absolute: 38 cells/uL (ref 0–200)
Basophils Relative: 0.5 %
Eosinophils Absolute: 368 cells/uL (ref 15–500)
Eosinophils Relative: 4.9 %
HCT: 48.6 % (ref 38.5–50.0)
Hemoglobin: 16.1 g/dL (ref 13.2–17.1)
Lymphs Abs: 1538 cells/uL (ref 850–3900)
MCH: 29 pg (ref 27.0–33.0)
MCHC: 33.1 g/dL (ref 32.0–36.0)
MCV: 87.6 fL (ref 80.0–100.0)
MPV: 11.4 fL (ref 7.5–12.5)
Monocytes Relative: 6.4 %
Neutro Abs: 5078 cells/uL (ref 1500–7800)
Neutrophils Relative %: 67.7 %
Platelets: 188 10*3/uL (ref 140–400)
RBC: 5.55 10*6/uL (ref 4.20–5.80)
RDW: 13.6 % (ref 11.0–15.0)
Total Lymphocyte: 20.5 %
WBC: 7.5 10*3/uL (ref 3.8–10.8)

## 2021-11-23 LAB — COMPLETE METABOLIC PANEL WITH GFR
AG Ratio: 1.6 (calc) (ref 1.0–2.5)
ALT: 27 U/L (ref 9–46)
AST: 27 U/L (ref 10–35)
Albumin: 4.4 g/dL (ref 3.6–5.1)
Alkaline phosphatase (APISO): 105 U/L (ref 35–144)
BUN: 18 mg/dL (ref 7–25)
CO2: 30 mmol/L (ref 20–32)
Calcium: 9.6 mg/dL (ref 8.6–10.3)
Chloride: 101 mmol/L (ref 98–110)
Creat: 0.88 mg/dL (ref 0.70–1.28)
Globulin: 2.8 g/dL (calc) (ref 1.9–3.7)
Glucose, Bld: 214 mg/dL — ABNORMAL HIGH (ref 65–99)
Potassium: 3.9 mmol/L (ref 3.5–5.3)
Sodium: 139 mmol/L (ref 135–146)
Total Bilirubin: 1 mg/dL (ref 0.2–1.2)
Total Protein: 7.2 g/dL (ref 6.1–8.1)
eGFR: 93 mL/min/{1.73_m2} (ref >=60)

## 2021-11-23 LAB — HEMOGLOBIN A1C
Hgb A1c MFr Bld: 6.3 % of total Hgb — ABNORMAL HIGH (ref <5.7)
Mean Plasma Glucose: 134 mg/dL
eAG (mmol/L): 7.4 mmol/L

## 2021-11-23 LAB — MICROALBUMIN / CREATININE URINE RATIO
Creatinine, Urine: 80 mg/dL (ref 20–320)
Microalb Creat Ratio: 93 mcg/mg creat — ABNORMAL HIGH (ref <30)
Microalb, Ur: 7.4 mg/dL

## 2021-11-23 NOTE — Progress Notes (Signed)
Hi Nathan [name], your A1c went up just slightly to 6.3 compared to last 6 months ago when it was 6.0.  But still a good number.  Continue to work on the [employer] and regular exercise.  Liver and kidney function look good.  Blood count is normal.  Your eosinophils are up a little bit.  Even 2 months ago they were around 278 there are up to 368.  So I do think that some of the itching could be related to allergies.  So I definitely think adding in the antihistamine such as Zyrtec etc. will be really helpful.  I did send over prescription 1 called size all.  It looks like your insurance would cover that 1 so hopefully you are able to pick it up.  Also have a little bit of protein in the urine.  This can come from diabetes.  Current guidelines recommend that we screen for this because if we find it then we can go ahead and put you on a low-dose ACE inhibitor which is a type of blood pressure pill to help reduce any further damage to the kidneys.  I would like to go ahead and start a low-dose of this medication.  If you are okay with that then please let me know.

## 2021-11-25 ENCOUNTER — Encounter: Payer: Self-pay | Admitting: Family Medicine

## 2021-11-25 DIAGNOSIS — R809 Proteinuria, unspecified: Secondary | ICD-10-CM | POA: Insufficient documentation

## 2021-11-25 DIAGNOSIS — E1129 Type 2 diabetes mellitus with other diabetic kidney complication: Secondary | ICD-10-CM | POA: Insufficient documentation

## 2021-11-25 MED ORDER — LOSARTAN POTASSIUM 25 MG PO TABS: 25.0000 mg | ORAL_TABLET | Freq: Every day | ORAL | 1 refills | Status: DC

## 2021-11-25 NOTE — Progress Notes (Signed)
Orders Placed This Encounter     levocetirizine (XYZAL) 5 MG tablet         Sig: Take 1 tablet (5 mg total) by mouth every evening.         Dispense:  30 tablet         Refill:  2     triamcinolone cream (KENALOG) 0.1 %         Sig: Apply 1 application topically 2 (two) times daily.         Dispense:  45 g         Refill:  1     losartan (COZAAR) 25 MG tablet         Sig: Take 1 tablet (25 mg total) by mouth daily. For microalbuminuria         Dispense:  90 tablet         Refill:  1

## 2021-11-25 NOTE — Addendum Note (Signed)
Addended by: Beatrice Lecher D on: 11/25/2021 08:25 AM   Modules accepted: Orders

## 2021-12-05 ENCOUNTER — Telehealth: Payer: Self-pay

## 2021-12-05 NOTE — Telephone Encounter (Signed)
Patient called office regarding his kidney function. Patient would like to know if he needs a referral to a kidney Dr. Since he has some kidney damage or if he should just continue on the Losartan? Forward to Dr. Madilyn Fireman.  ?

## 2021-12-05 NOTE — Telephone Encounter (Signed)
Patient advised of message and verbalized understanding.  

## 2021-12-05 NOTE — Telephone Encounter (Signed)
Lets plan to just check for the protein again when he comes back to see if he still spilling excess protein.  But typically the treatment is to just stay on the losartan.  But if worsening an increase in the protein that is spilling into the urine even yes we would need to refer him.  The good news is that his serum creatinine is good.  Also him keeping the blood pressure under good control helps as well.  See if he was able to check his blood pressure again at home since it was high when he was here a couple of weeks ago. ?

## 2021-12-21 ENCOUNTER — Other Ambulatory Visit: Payer: Self-pay | Admitting: Family Medicine

## 2021-12-21 ENCOUNTER — Other Ambulatory Visit (HOSPITAL_COMMUNITY): Payer: Self-pay | Admitting: Psychiatry

## 2021-12-21 DIAGNOSIS — E785 Hyperlipidemia, unspecified: Secondary | ICD-10-CM

## 2022-01-18 ENCOUNTER — Encounter: Payer: Self-pay | Admitting: Family Medicine

## 2022-01-19 ENCOUNTER — Encounter: Payer: Self-pay | Admitting: Internal Medicine

## 2022-02-22 ENCOUNTER — Ambulatory Visit (INDEPENDENT_AMBULATORY_CARE_PROVIDER_SITE_OTHER): Payer: Medicare Other | Admitting: Family Medicine

## 2022-02-22 ENCOUNTER — Encounter: Payer: Self-pay | Admitting: Family Medicine

## 2022-02-22 VITALS — BP 121/67 | HR 71 | Ht 66.0 in | Wt 173.0 lb

## 2022-02-22 DIAGNOSIS — E1129 Type 2 diabetes mellitus with other diabetic kidney complication: Secondary | ICD-10-CM

## 2022-02-22 DIAGNOSIS — R809 Proteinuria, unspecified: Secondary | ICD-10-CM | POA: Diagnosis not present

## 2022-02-22 DIAGNOSIS — E119 Type 2 diabetes mellitus without complications: Secondary | ICD-10-CM

## 2022-02-22 DIAGNOSIS — L299 Pruritus, unspecified: Secondary | ICD-10-CM | POA: Diagnosis not present

## 2022-02-22 LAB — POCT GLYCOSYLATED HEMOGLOBIN (HGB A1C): Hemoglobin A1C: 5.9 % — AB (ref 4.0–5.6)

## 2022-02-22 MED ORDER — LEVOCETIRIZINE DIHYDROCHLORIDE 5 MG PO TABS: 5.0000 mg | ORAL_TABLET | Freq: Every evening | ORAL | 0 refills | Status: DC

## 2022-02-22 NOTE — Assessment & Plan Note (Signed)
Continue losartan.  Plus blood pressure looks better on the medication.  Due for BMP.

## 2022-02-22 NOTE — Assessment & Plan Note (Addendum)
Well controlled.  A1c looks phenomenal today at 5.9.  Continue current regimen. Follow up in  4 mo  Will schedule for eye exam

## 2022-02-22 NOTE — Progress Notes (Signed)
Established Patient Office Visit  Subjective   Patient ID: Nathan [name], male    DOB: 03-10-1951  Age: 71 y.o. MRN: [id number]  Chief Complaint  Patient presents with   Diabetes    HPI  Diabetes - no hypoglycemic events. No wounds or sores that are not healing well. No increased thirst or urination. Checking glucose at home. Taking medications as prescribed without any side effects. He is interested in retinal screening  Scheduled for colonoscopy in June.    Still experiencing the itching that he had mentioned on his arms and face.  He does not have any rash with it.  We have treated him with an antihistamine and he says it did help for several weeks and then discontinued it and the itching has gradually come back.  The ARB medication was started after the itching started.  And none of his medications have changed and he has been on them for quite some time.  Liver function was normal.  Microalbuminuria-tolerating losartan well without any side effects or problems.    ROS    Objective:     BP 121/67   Pulse 71   Ht '5\' 6"'$  (1.676 m)   Wt 173 lb (78.5 kg)   SpO2 94%   BMI 27.92 kg/m    Physical Exam Constitutional:      Appearance: He is well-developed.  HENT:     Head: Normocephalic and atraumatic.  Cardiovascular:     Rate and Rhythm: Normal rate and regular rhythm.     Heart sounds: Normal heart sounds.  Pulmonary:     Effort: Pulmonary effort is normal.     Breath sounds: Normal breath sounds.  Skin:    General: Skin is warm and dry.  Neurological:     Mental Status: He is alert and oriented to person, place, and time.  Psychiatric:        Behavior: Behavior normal.     Results for orders placed or performed in visit on 02/22/22  POCT glycosylated hemoglobin (Hb A1C)  Result Value Ref Range   Hemoglobin A1C 5.9 (A) 4.0 - 5.6 %   HbA1c POC (<> result, manual entry)     HbA1c, POC (prediabetic range)     HbA1c, POC (controlled diabetic range)         The 10-year ASCVD risk score (Arnett DK, et al., 2019) is: 29.2%    Assessment & Plan:   Problem List Items Addressed This Visit       Endocrine   Microalbuminuria due to type 2 diabetes mellitus ([place])    Continue losartan.  Plus blood pressure looks better on the medication.  Due for BMP.       Diabetes mellitus, type II ([place]) - Primary    Well controlled.  A1c looks phenomenal today at 5.9.  Continue current regimen. Follow up in  4 mo  Will schedule for eye exam        Relevant Orders   POCT glycosylated hemoglobin (Hb A1C) (Completed)   Lipid Panel w/reflex Direct LDL   TSH   Resp Allergy Profile Regn2DC [place] MD [place]   BASIC METABOLIC PANEL WITH GFR   Urine Microalbumin w/creat. ratio   Other Visit Diagnoses     Itch of skin       Relevant Medications   levocetirizine (XYZAL) 5 MG tablet   Other Relevant Orders   Lipid Panel w/reflex Direct LDL   TSH   Resp Allergy Profile Regn2DC [place] MD [place]  [facility]   BASIC METABOLIC PANEL WITH GFR   Urine Microalbumin w/creat. ratio   Ambulatory referral to Dermatology      Itchy of skin of arms/face-still unclear etiology he does not have any rash with it such as hives as we discussed doing an allergy panel, checking his thyroid.  And considering dermatology referral.   Return in about 4 months (around 06/24/2022) for Diabetes follow-up.    Beatrice Lecher, MD

## 2022-02-23 LAB — DIRECT LDL: Direct LDL: 77 mg/dL (ref <100)

## 2022-02-23 LAB — BASIC METABOLIC PANEL WITH GFR
BUN: 23 mg/dL (ref 7–25)
CO2: 27 mmol/L (ref 20–32)
Calcium: 10 mg/dL (ref 8.6–10.3)
Chloride: 103 mmol/L (ref 98–110)
Creat: 0.84 mg/dL (ref 0.70–1.28)
Glucose, Bld: 111 mg/dL (ref 65–139)
Potassium: 4.3 mmol/L (ref 3.5–5.3)
Sodium: 139 mmol/L (ref 135–146)
eGFR: 94 mL/min/{1.73_m2} (ref >=60)

## 2022-02-23 LAB — RESPIRATORY ALLERGY PROFILE REGION II ~~LOC~~
Allergen, A. alternata, m6: <0.1 kU/L
Allergen, Cedar tree, t12: 0.13 kU/L — ABNORMAL HIGH
Allergen, Comm Silver Birch, t9: 18.7 kU/L — ABNORMAL HIGH
Allergen, Cottonwood, t14: 0.13 kU/L — ABNORMAL HIGH
Allergen, D pternoyssinus,d7: 1.43 kU/L — ABNORMAL HIGH
Allergen, Mouse Urine Protein, e78: <0.1 kU/L
Allergen, Mulberry, t76: <0.1 kU/L
Allergen, Oak,t7: 13.4 kU/L — ABNORMAL HIGH
Allergen, P. notatum, m1: <0.1 kU/L
Aspergillus fumigatus, m3: <0.1 kU/L
Bermuda Grass: 2.32 kU/L — ABNORMAL HIGH
Box Elder IgE: 0.32 kU/L — ABNORMAL HIGH
CLADOSPORIUM HERBARUM (M2) IGE: <0.1 kU/L
COMMON RAGWEED (SHORT) (W1) IGE: 1.02 kU/L — ABNORMAL HIGH
Cat Dander: <0.1 kU/L
Class: 0
Class: 0
Class: 0
Class: 0
Class: 0
Class: 0
Class: 0
Class: 0
Class: 0
Class: 1
Class: 1
Class: 1
Class: 2
Class: 2
Class: 2
Class: 2
Class: 2
Class: 3
Class: 3
Class: 3
Class: 4
Cockroach: <0.1 kU/L
D. farinae: 1.39 kU/L — ABNORMAL HIGH
Dog Dander: 4.32 kU/L — ABNORMAL HIGH
Elm IgE: 0.51 kU/L — ABNORMAL HIGH
IgE (Immunoglobulin E), Serum: 193 kU/L — ABNORMAL HIGH (ref <=114)
Johnson Grass: 2.73 kU/L — ABNORMAL HIGH
Pecan/Hickory Tree IgE: 0.6 kU/L — ABNORMAL HIGH
Rough Pigweed  IgE: 0.63 kU/L — ABNORMAL HIGH
Sheep Sorrel IgE: <0.1 kU/L
Timothy Grass: 5.82 kU/L — ABNORMAL HIGH

## 2022-02-23 LAB — LIPID PANEL W/REFLEX DIRECT LDL
Cholesterol: 179 mg/dL (ref <200)
HDL: 41 mg/dL (ref >=40)
Non-HDL Cholesterol (Calc): 138 mg/dL (calc) — ABNORMAL HIGH (ref <130)
Total CHOL/HDL Ratio: 4.4 (calc) (ref <5.0)
Triglycerides: 427 mg/dL — ABNORMAL HIGH (ref <150)

## 2022-02-23 LAB — TSH: TSH: 1.25 mIU/L (ref 0.40–4.50)

## 2022-02-23 LAB — MICROALBUMIN / CREATININE URINE RATIO
Creatinine, Urine: 117 mg/dL (ref 20–320)
Microalb Creat Ratio: 47 mcg/mg creat — ABNORMAL HIGH (ref <30)
Microalb, Ur: 5.5 mg/dL

## 2022-02-23 LAB — INTERPRETATION:

## 2022-02-24 ENCOUNTER — Ambulatory Visit (AMBULATORY_SURGERY_CENTER): Payer: Self-pay

## 2022-02-24 VITALS — Ht 66.0 in | Wt 173.0 lb

## 2022-02-24 DIAGNOSIS — Z86010 Personal history of colonic polyps: Secondary | ICD-10-CM

## 2022-02-24 MED ORDER — NA SULFATE-K SULFATE-MG SULF 17.5-3.13-1.6 GM/177ML PO SOLN: 1.0000 | Freq: Once | ORAL | 0 refills | Status: AC

## 2022-02-24 NOTE — Progress Notes (Signed)
Hi Nathan [name], your triglycerides are really high again. Are you taking your Liptor nightly? Also we need to get to back on something specifically for your triglycerides like Lovaza. You do still have some protein in your urine but it is much better. Went from 93 to 21.  That is great! Your thyroid and metabolic panel are ok. The allergy panel shows you are particularly allergy to [facility] and [place]. Those are the highest. Also dogs.. You have some milder response to grasses. We can always consider referring  you to an allergist if you like.

## 2022-02-24 NOTE — Progress Notes (Signed)
No egg or soy allergy known to patient  No issues known to pt with past sedation with any surgeries or procedures Patient denies ever being told they had issues or difficulty with intubation  No FH of Malignant Hyperthermia Pt is not on diet pills Pt is not on home 02  Pt is not on blood thinners  Pt denies issues with constipation  No A fib or A flutter NO PA's for preps discussed with pt in PV today  Discussed with pt there will be an out-of-pocket cost for prep and that varies from $0 to 70 + dollars - pt verbalized understanding  Pt instructed to use Singlecare.com or GoodRx for a price reduction on prep  Pt verified name, DOB, address and insurance during PV today.  Pt given instruction packet to read and not return.  Pt encouraged to call with questions or issues.  Pt has My chart, procedure instructions sent via My Chart

## 2022-03-07 ENCOUNTER — Ambulatory Visit: Payer: Medicare Other | Admitting: Family Medicine

## 2022-03-09 LAB — HM DIABETES EYE EXAM

## 2022-03-17 ENCOUNTER — Other Ambulatory Visit (HOSPITAL_COMMUNITY): Payer: Self-pay | Admitting: Psychiatry

## 2022-03-20 ENCOUNTER — Encounter: Payer: Self-pay | Admitting: Internal Medicine

## 2022-03-21 ENCOUNTER — Encounter: Payer: Self-pay | Admitting: Internal Medicine

## 2022-03-22 ENCOUNTER — Other Ambulatory Visit (HOSPITAL_COMMUNITY): Payer: Self-pay | Admitting: Psychiatry

## 2022-03-24 ENCOUNTER — Ambulatory Visit (AMBULATORY_SURGERY_CENTER): Payer: Medicare Other | Admitting: Internal Medicine

## 2022-03-24 ENCOUNTER — Encounter: Payer: Self-pay | Admitting: Internal Medicine

## 2022-03-24 VITALS — BP 107/68 | HR 65 | Temp 98.7°F | Resp 16 | Ht 66.0 in | Wt 173.0 lb

## 2022-03-24 DIAGNOSIS — D123 Benign neoplasm of transverse colon: Secondary | ICD-10-CM | POA: Diagnosis not present

## 2022-03-24 DIAGNOSIS — D125 Benign neoplasm of sigmoid colon: Secondary | ICD-10-CM

## 2022-03-24 DIAGNOSIS — Z86010 Personal history of colonic polyps: Secondary | ICD-10-CM

## 2022-03-24 DIAGNOSIS — Z09 Encounter for follow-up examination after completed treatment for conditions other than malignant neoplasm: Secondary | ICD-10-CM

## 2022-03-24 MED ORDER — SODIUM CHLORIDE 0.9 % IV SOLN: 500.0000 mL | Freq: Once | INTRAVENOUS | Status: DC

## 2022-03-24 NOTE — Op Note (Signed)
[facility] Patient Name: Nathan [name] Procedure Date: 03/24/2022 1:31 PM MRN: [id number] Endoscopist: Jerene Bears , MD Age: 71 Referring MD:  Date of Birth: Apr 12, 1951 Gender: Male Account #: [id number] Procedure:                Colonoscopy Indications:              High risk colon cancer surveillance: Personal                            history of non-advanced adenoma, Last colonoscopy:                            May 2016 Medicines:                Monitored Anesthesia Care Procedure:                Pre-Anesthesia Assessment:                           - Prior to the procedure, a History and Physical                            was performed, and patient medications and                            allergies were reviewed. The patient's tolerance of                            previous anesthesia was also reviewed. The risks                            and benefits of the procedure and the sedation                            options and risks were discussed with the patient.                            All questions were answered, and informed consent                            was obtained. Prior Anticoagulants: The patient has                            taken no previous anticoagulant or antiplatelet                            agents. ASA Grade Assessment: II - A patient with                            mild systemic disease. After reviewing the risks                            and benefits, the patient was deemed in  satisfactory condition to undergo the procedure.                           After obtaining informed consent, the colonoscope                            was passed under direct vision. Throughout the                            procedure, the patient's blood pressure, pulse, and                            oxygen saturations were monitored continuously. The                            CF [id number] #[id number] was introduced through the anus                             and advanced to the cecum, identified by                            appendiceal orifice and ileocecal valve. The                            colonoscopy was performed without difficulty. The                            patient tolerated the procedure well. The quality                            of the bowel preparation was good. The ileocecal                            valve, appendiceal orifice, and rectum were                            photographed. Scope In: 1:33:56 PM Scope Out: 1:48:18 PM Scope Withdrawal Time: 0 hours 12 minutes 45 seconds  Total Procedure Duration: 0 hours 14 minutes 22 seconds  Findings:                 The digital rectal exam was normal.                           Two sessile polyps were found in the transverse                            colon. The polyps were 2 to 6 mm in size. These                            polyps were removed with a cold snare. Resection                            and retrieval were complete.  A 4 mm polyp was found in the sigmoid colon. The                            polyp was sessile. The polyp was removed with a                            cold snare. Resection and retrieval were complete.                           Multiple small and large-mouthed diverticula were                            found in the sigmoid colon, descending colon,                            transverse colon and ascending colon.                           Internal hemorrhoids were found during                            retroflexion. The hemorrhoids were small. Complications:            No immediate complications. Estimated Blood Loss:     Estimated blood loss was minimal. Impression:               - Two 2 to 6 mm polyps in the transverse colon,                            removed with a cold snare. Resected and retrieved.                           - One 4 mm polyp in the sigmoid colon, removed with                             a cold snare. Resected and retrieved.                           - Moderate diverticulosis in the sigmoid colon, in                            the descending colon, in the transverse colon and                            in the ascending colon.                           - Small internal hemorrhoids. Recommendation:           - Patient has a contact number available for                            emergencies. The signs and symptoms of potential  delayed complications were discussed with the                            patient. Return to normal activities tomorrow.                            Written discharge instructions were provided to the                            patient.                           - Resume previous diet.                           - Continue present medications.                           - Await pathology results.                           - Repeat colonoscopy is recommended for                            surveillance. The colonoscopy date will be                            determined after pathology results from today's                            exam become available for review. Jerene Bears, MD 03/24/2022 1:51:46 PM This report has been signed electronically.

## 2022-03-24 NOTE — Progress Notes (Signed)
PT taken to PACU. Monitors in place. VSS. Report given to RN. 

## 2022-03-24 NOTE — Progress Notes (Signed)
GASTROENTEROLOGY PROCEDURE H&P NOTE   Primary Care Physician: Hali Marry, MD    Reason for Procedure:  History of adenomatous colon polyps  Plan:    Surveillance colonoscopy  Patient is appropriate for endoscopic procedure(s) in the ambulatory ([place]) setting.  The nature of the procedure, as well as the risks, benefits, and alternatives were carefully and thoroughly reviewed with the patient. Ample time for discussion and questions allowed. The patient understood, was satisfied, and agreed to proceed.     HPI: Nathan [name] is a 71 y.o. male who presents for surveillance colonoscopy.  Medical history as below.  Tolerated the prep.  No recent chest pain or shortness of breath.  No abdominal pain today.  Past Medical History:  Diagnosis Date   Adenomatous colon polyp    Allergy    Anxiety    CRPS (complex regional pain syndrome), upper limb    Depression    Diabetes mellitus without complication ([place])    Diverticulitis 2013   Diverticulosis    Diverticulosis    GERD (gastroesophageal reflux disease)    Glaucoma    History of Helicobacter pylori infection    Hyperlipidemia    Hypertension    no per pt   Intestinal metaplasia of gastric mucosa    Ligament tear of upper extremity April 2008   Left elbow   Nephrolithiasis    Renal calculi    Renal disorder     Past Surgical History:  Procedure Laterality Date   LITHOTRIPSY  2004   kidney stones   REPLACEMENT TOTAL KNEE Right 2014   ROTATOR CUFF REPAIR Left 2011   ULNAR NERVE REPAIR Left 2009, 2010,   3 surgeries    Prior to Admission medications   Medication Sig Start Date End Date Taking? Authorizing Provider  busPIRone (BUSPAR) 10 MG tablet Take 1 tablet by mouth once daily 11/21/21  Yes [name], MD  levocetirizine (XYZAL) 5 MG tablet Take 1 tablet (5 mg total) by mouth every evening. 02/22/22  Yes Hali Marry, MD  losartan (COZAAR) 25 MG tablet Take 1 tablet (25 mg total) by  mouth daily. For microalbuminuria 11/25/21  Yes Hali Marry, MD  meloxicam (MOBIC) 15 MG tablet Take 15 mg by mouth as needed for pain.   Yes [provider]  Multiple Vitamins-Minerals (CENTRUM ADULTS PO) Take 1 tablet by mouth daily.   Yes [provider]  sertraline (ZOLOFT) 100 MG tablet Take 1 tablet by mouth once daily 03/22/22  Yes [name], MD  traZODone (DESYREL) 50 MG tablet TAKE 1 TABLET BY MOUTH AT BEDTIME 09/23/21  Yes [name], MD  atorvastatin (LIPITOR) 40 MG tablet Take 1 tablet (40 mg total) by mouth daily. 12/21/21   Hali Marry, MD  Blood Glucose Monitoring Suppl (ONE TOUCH ULTRA SYSTEM KIT) w/Device KIT For testing blood sugars daily Dx: E11.9 12/08/16   Hali Marry, MD  glucose blood (ONE TOUCH ULTRA TEST) test strip USE 1 STRIP TO CHECK GLUCOSE ONCE DAILY 01/05/20   Hali Marry, MD  OneTouch Delica Lancets 45W MISC 1 strip by Other route in the morning. 01/05/20   Hali Marry, MD  triamcinolone cream (KENALOG) 0.1 % Apply 1 application topically 2 (two) times daily. 11/22/21   Hali Marry, MD  VOLTAREN 1 % GEL  12/30/14   [provider]    Current Outpatient Medications  Medication Sig Dispense Refill   busPIRone (BUSPAR) 10 MG tablet Take 1 tablet by  mouth once daily 30 tablet 2   levocetirizine (XYZAL) 5 MG tablet Take 1 tablet (5 mg total) by mouth every evening. 90 tablet 0   losartan (COZAAR) 25 MG tablet Take 1 tablet (25 mg total) by mouth daily. For microalbuminuria 90 tablet 1   meloxicam (MOBIC) 15 MG tablet Take 15 mg by mouth as needed for pain.     Multiple Vitamins-Minerals (CENTRUM ADULTS PO) Take 1 tablet by mouth daily.     sertraline (ZOLOFT) 100 MG tablet Take 1 tablet by mouth once daily 90 tablet 0   traZODone (DESYREL) 50 MG tablet TAKE 1 TABLET BY MOUTH AT BEDTIME 90 tablet 0   atorvastatin (LIPITOR) 40 MG tablet Take 1 tablet (40 mg total) by mouth daily. 90  tablet 3   Blood Glucose Monitoring Suppl (ONE TOUCH ULTRA SYSTEM KIT) w/Device KIT For testing blood sugars daily Dx: E11.9 1 each 0   glucose blood (ONE TOUCH ULTRA TEST) test strip USE 1 STRIP TO CHECK GLUCOSE ONCE DAILY 100 each 0   OneTouch Delica Lancets 63S MISC 1 strip by Other route in the morning. 100 each PRN   triamcinolone cream (KENALOG) 0.1 % Apply 1 application topically 2 (two) times daily. 45 g 1   VOLTAREN 1 % GEL   5   Current Facility-Administered Medications  Medication Dose Route Frequency Provider Last Rate Last Admin   0.9 %  sodium chloride infusion  500 mL Intravenous Once Giorgio Chabot, Lajuan Lines, MD        Allergies as of 03/24/2022 - Review Complete 03/24/2022  Allergen Reaction Noted   Adhesive [tape]  04/26/2012   Hydrocodone-acetaminophen Itching 02/23/2012    Family History  Problem Relation Age of Onset   Kidney disease Mother    Lung cancer Father 69   Colon cancer Neg Hx    Esophageal cancer Neg Hx    Rectal cancer Neg Hx    Stomach cancer Neg Hx    Colon polyps Neg Hx     Social History   Socioeconomic History   Marital status: Married    Spouse name: Not on file   Number of children: 1   Years of education: 16   Highest education level: Bachelor's degree (e.g., BA, AB, BS)  Occupational History   Occupation: AIRCRAFT INT [employer]    Employer: [employer]    Comment: Retired.   Tobacco Use   Smoking status: Former    Packs/day: 1.00    Years: 10.00    Total pack years: 10.00    Types: Cigarettes    Quit date: 09/25/1981    Years since quitting: 40.5   Smokeless tobacco: Never  Vaping Use   Vaping Use: Never used  Substance and Sexual Activity   Alcohol use: Not Currently    Alcohol/week: 0.0 - 2.0 standard drinks of alcohol   Drug use: No   Sexual activity: Yes    Partners: Female  Other Topics Concern   Not on file  Social History Narrative   Some exercise. Caffeine daily. Plays chess to keep brain stimulated   Social Determinants of  Health   Financial Resource Strain: Low Risk  (07/24/2018)   Overall Financial Resource Strain (CARDIA)    Difficulty of Paying Living Expenses: Not hard at all  Food Insecurity: No Food Insecurity (07/24/2018)   Hunger Vital Sign    Worried About Running Out of Food in the Last Year: Never true    Ran Out of Food in the Last Year:  Never true  Transportation Needs: No Transportation Needs (07/24/2018)   PRAPARE - Hydrologist (Medical): No    Lack of Transportation (Non-Medical): No  Physical Activity: Inactive (07/24/2018)   Exercise Vital Sign    Days of Exercise per Week: 0 days    Minutes of Exercise per Session: 0 min  Stress: No Stress Concern Present (07/24/2018)   [place]    Feeling of Stress : Not at all  Social Connections: Moderately Integrated (07/24/2018)   Social Connection and Isolation Panel [NHANES]    Frequency of Communication with Friends and Family: More than three times a week    Frequency of Social Gatherings with Friends and Family: Once a week    Attends Religious Services: More than 4 times per year    Active Member of [employer] or Organizations: No    Attends Archivist Meetings: Never    Marital Status: Married  Human resources officer Violence: Not At Risk (07/24/2018)   Humiliation, Afraid, Rape, and Kick questionnaire    Fear of Current or Ex-Partner: No    Emotionally Abused: No    Physically Abused: No    Sexually Abused: No    Physical Exam: Vital signs in last 24 hours: _0  116/76   Pulse 67   Temp 98.7 F (37.1 C)   Ht _1  (1.676 m)   Wt 173 lb (78.5 kg)   SpO2 98%   BMI 27.92 kg/m  GEN: NAD EYE: Sclerae anicteric ENT: MMM CV: Non-tachycardic Pulm: CTA b/l GI: Soft, NT/ND NEURO:  Alert & Oriented x 3   Zenovia Jarred, MD [facility]  03/24/2022 1:24 PM

## 2022-03-24 NOTE — Progress Notes (Signed)
Pt's states no medical or surgical changes since previsit or office visit.   Vs in adm by DT

## 2022-03-24 NOTE — Progress Notes (Signed)
Called to room to assist during endoscopic procedure.  Patient ID and intended procedure confirmed with present staff. Received instructions for my participation in the procedure from the performing physician.  

## 2022-03-24 NOTE — Patient Instructions (Signed)
Await pathology results.  Handouts given on polyps, diverticulosis, and hemorrhoids.  YOU HAD AN ENDOSCOPIC PROCEDURE TODAY AT [facility]:   Refer to the procedure report that was given to you for any specific questions about what was found during the examination.  If the procedure report does not answer your questions, please call your gastroenterologist to clarify.  If you requested that your care partner not be given the details of your procedure findings, then the procedure report has been included in a sealed envelope for you to review at your convenience later.  YOU SHOULD EXPECT: Some feelings of bloating in the abdomen. Passage of more gas than usual.  Walking can help get rid of the air that was put into your GI tract during the procedure and reduce the bloating. If you had a lower endoscopy (such as a colonoscopy or flexible sigmoidoscopy) you may notice spotting of blood in your stool or on the toilet paper. If you underwent a bowel prep for your procedure, you may not have a normal bowel movement for a few days.  Please Note:  You might notice some irritation and congestion in your nose or some drainage.  This is from the oxygen used during your procedure.  There is no need for concern and it should clear up in a day or so.  SYMPTOMS TO REPORT IMMEDIATELY:  Following lower endoscopy (colonoscopy or flexible sigmoidoscopy):  Excessive amounts of blood in the stool  Significant tenderness or worsening of abdominal pains  Swelling of the abdomen that is new, acute  Fever of 100F or higher   For urgent or emergent issues, a gastroenterologist can be reached at any hour by calling [phone]. Do not use MyChart messaging for urgent concerns.    DIET:  We do recommend a small meal at first, but then you may proceed to your regular diet.  Drink plenty of fluids but you should avoid alcoholic beverages for 24 hours.  ACTIVITY:  You should plan to take it easy for  the rest of today and you should NOT DRIVE or use heavy machinery until tomorrow (because of the sedation medicines used during the test).    FOLLOW UP: Our staff will call the number listed on your records the next business day following your procedure.  We will call around 7:15- 8:00 am to check on you and address any questions or concerns that you may have regarding the information given to you following your procedure. If we do not reach you, we will leave a message.  If you develop any symptoms (ie: fever, flu-like symptoms, shortness of breath, cough etc.) before then, please call [phone].  If you test positive for Covid 19 in the 2 weeks post procedure, please call and report this information to [place].    If any biopsies were taken you will be contacted by phone or by letter within the next 1-3 weeks.  Please call us at [phone] if you have not heard about the biopsies in 3 weeks.    SIGNATURES/CONFIDENTIALITY: You and/or your care partner have signed paperwork which will be entered into your electronic medical record.  These signatures attest to the fact that that the information above on your After Visit Summary has been reviewed and is understood.  Full responsibility of the confidentiality of this discharge information lies with you and/or your care-partner.

## 2022-03-27 ENCOUNTER — Telehealth: Payer: Self-pay

## 2022-03-27 NOTE — Telephone Encounter (Signed)
  Follow up Call-     03/24/2022   12:53 PM  Call back number  Post procedure Call Back phone  # [phone]  Permission to leave phone message Yes     Left message

## 2022-04-04 ENCOUNTER — Encounter: Payer: Self-pay | Admitting: Internal Medicine

## 2022-05-16 ENCOUNTER — Other Ambulatory Visit: Payer: Self-pay | Admitting: Family Medicine

## 2022-05-16 DIAGNOSIS — R809 Proteinuria, unspecified: Secondary | ICD-10-CM

## 2022-05-17 ENCOUNTER — Encounter: Payer: Self-pay | Admitting: General Practice

## 2022-05-24 ENCOUNTER — Encounter: Payer: Self-pay | Admitting: Family Medicine

## 2022-06-13 ENCOUNTER — Other Ambulatory Visit (HOSPITAL_COMMUNITY): Payer: Self-pay | Admitting: Psychiatry

## 2022-06-26 ENCOUNTER — Ambulatory Visit (INDEPENDENT_AMBULATORY_CARE_PROVIDER_SITE_OTHER): Payer: Medicare Other | Admitting: Family Medicine

## 2022-06-26 ENCOUNTER — Encounter: Payer: Self-pay | Admitting: Family Medicine

## 2022-06-26 VITALS — BP 131/65 | HR 65 | Ht 66.0 in | Wt 175.0 lb

## 2022-06-26 DIAGNOSIS — F3341 Major depressive disorder, recurrent, in partial remission: Secondary | ICD-10-CM

## 2022-06-26 DIAGNOSIS — Z885 Allergy status to narcotic agent status: Secondary | ICD-10-CM | POA: Diagnosis not present

## 2022-06-26 DIAGNOSIS — F32A Depression, unspecified: Secondary | ICD-10-CM | POA: Diagnosis not present

## 2022-06-26 DIAGNOSIS — I4510 Unspecified right bundle-branch block: Secondary | ICD-10-CM | POA: Diagnosis not present

## 2022-06-26 DIAGNOSIS — I6782 Cerebral ischemia: Secondary | ICD-10-CM | POA: Diagnosis not present

## 2022-06-26 DIAGNOSIS — Z91048 Other nonmedicinal substance allergy status: Secondary | ICD-10-CM | POA: Diagnosis not present

## 2022-06-26 DIAGNOSIS — E1140 Type 2 diabetes mellitus with diabetic neuropathy, unspecified: Secondary | ICD-10-CM | POA: Diagnosis not present

## 2022-06-26 DIAGNOSIS — L723 Sebaceous cyst: Secondary | ICD-10-CM

## 2022-06-26 DIAGNOSIS — Z91040 Latex allergy status: Secondary | ICD-10-CM | POA: Diagnosis not present

## 2022-06-26 DIAGNOSIS — R112 Nausea with vomiting, unspecified: Secondary | ICD-10-CM | POA: Diagnosis not present

## 2022-06-26 DIAGNOSIS — Z7984 Long term (current) use of oral hypoglycemic drugs: Secondary | ICD-10-CM | POA: Diagnosis not present

## 2022-06-26 DIAGNOSIS — Z125 Encounter for screening for malignant neoplasm of prostate: Secondary | ICD-10-CM

## 2022-06-26 DIAGNOSIS — E119 Type 2 diabetes mellitus without complications: Secondary | ICD-10-CM

## 2022-06-26 DIAGNOSIS — R42 Dizziness and giddiness: Secondary | ICD-10-CM | POA: Diagnosis not present

## 2022-06-26 DIAGNOSIS — Z87891 Personal history of nicotine dependence: Secondary | ICD-10-CM | POA: Diagnosis not present

## 2022-06-26 DIAGNOSIS — G319 Degenerative disease of nervous system, unspecified: Secondary | ICD-10-CM | POA: Diagnosis not present

## 2022-06-26 DIAGNOSIS — Z79899 Other long term (current) drug therapy: Secondary | ICD-10-CM | POA: Diagnosis not present

## 2022-06-26 LAB — POCT GLYCOSYLATED HEMOGLOBIN (HGB A1C): Hemoglobin A1C: 6 % — AB (ref 4.0–5.6)

## 2022-06-26 NOTE — Progress Notes (Signed)
   Established Patient Office Visit  Subjective   Patient ID: Nathan [name], male    DOB: 04/21/1951  Age: 71 y.o. MRN: [id number]  No chief complaint on file.   HPI  Diabetes - no hypoglycemic events. No wounds or sores that are not healing well. No increased thirst or urination. Checking glucose at home. Taking medications as prescribed without any side effects.  He does try to walk about a mile a day when he is walking his dogs.  Not able to do a lot of resistance or weight training because of his shoulder issues.  Did well let me know about a month ago that he had some pain in his low back that was radiating into the left buttock area he was pretty sure it was sciatica.  He took his meloxicam and uses Voltaren gel and eventually it did get some better.    ROS    Objective:     BP 131/65   Pulse 65   Ht '5\' 6"'$  (1.676 m)   Wt 175 lb (79.4 kg)   SpO2 95%   BMI 28.25 kg/m    Physical Exam Constitutional:      Appearance: He is well-developed.  HENT:     Head: Normocephalic and atraumatic.  Cardiovascular:     Rate and Rhythm: Normal rate and regular rhythm.     Heart sounds: Normal heart sounds.  Pulmonary:     Effort: Pulmonary effort is normal.     Breath sounds: Normal breath sounds.  Skin:    General: Skin is warm and dry.  Neurological:     Mental Status: He is alert and oriented to person, place, and time.  Psychiatric:        Behavior: Behavior normal.      Results for orders placed or performed in visit on 06/26/22  POCT glycosylated hemoglobin (Hb A1C)  Result Value Ref Range   Hemoglobin A1C 6.0 (A) 4.0 - 5.6 %   HbA1c POC (<> result, manual entry)     HbA1c, POC (prediabetic range)     HbA1c, POC (controlled diabetic range)        The 10-year ASCVD risk score (Arnett DK, et al., 2019) is: 38.6%    Assessment & Plan:   Problem List Items Addressed This Visit       Endocrine   Diabetes mellitus, type II ([place]) - Primary    1C looks  great today at 6.0.  Continue current regimen.      Relevant Orders   POCT glycosylated hemoglobin (Hb A1C) (Completed)   BASIC METABOLIC PANEL WITH GFR     Other   Major depressive disorder, recurrent (HCC)    Feels like overall he is doing well with the sertraline.  Continue current regimen.      Other Visit Diagnoses     Screening for prostate cancer       Relevant Orders   PSA   Sebaceous cyst           Also has a sebaceous cyst on the right side of his neck that he would like to have removed its been getting a little bit larger.  It is measuring approximately 1 cm at this point.  No active inflammation.  Return in about 6 months (around 12/26/2022) for Diabetes follow-up.    Beatrice Lecher, MD

## 2022-06-26 NOTE — Assessment & Plan Note (Signed)
Feels like overall he is doing well with the sertraline.  Continue current regimen.

## 2022-06-26 NOTE — Assessment & Plan Note (Signed)
1C looks great today at 6.0.  Continue current regimen.

## 2022-06-26 NOTE — Patient Instructions (Signed)
Please go for labs in end of November/early December.  You do not need to fast. Can schedule appointment with Dr. Dianah Field here in our office to have the cyst removed on the right side of your neck.

## 2022-07-03 ENCOUNTER — Ambulatory Visit (INDEPENDENT_AMBULATORY_CARE_PROVIDER_SITE_OTHER): Payer: Medicare Other | Admitting: Sports Medicine

## 2022-07-03 DIAGNOSIS — L723 Sebaceous cyst: Secondary | ICD-10-CM

## 2022-07-03 NOTE — Assessment & Plan Note (Signed)
This is a pleasant 71 year old male, he has had a small lump, subcentimeter subcutaneous tissues right neck, no drainage, no pain, this appears to be a sebaceous cyst on exam, I can see a small overlying pore. I have advised him that we should really just leave this alone for now. Return as needed.

## 2022-07-03 NOTE — Progress Notes (Signed)
    Procedures performed today:    None.  Independent interpretation of notes and tests performed by another provider:   None.  Brief History, Exam, Impression, and Recommendations:    Sebaceous cyst This is a pleasant 71 year old male, he has had a small lump, subcentimeter subcutaneous tissues right neck, no drainage, no pain, this appears to be a sebaceous cyst on exam, I can see a small overlying pore. I have advised him that we should really just leave this alone for now. Return as needed.    ____________________________________________ Gwen Her. Dianah Field, M.D., ABFM., CAQSM., AME. Primary Care and Sports Medicine [facility]  Adjunct Professor of [employer] of Medicine  Risk manager

## 2022-07-10 ENCOUNTER — Encounter: Payer: Self-pay | Admitting: Family Medicine

## 2022-07-10 ENCOUNTER — Ambulatory Visit (INDEPENDENT_AMBULATORY_CARE_PROVIDER_SITE_OTHER): Payer: Medicare Other | Admitting: Family Medicine

## 2022-07-10 VITALS — BP 141/86 | HR 71 | Ht 66.0 in | Wt 175.0 lb

## 2022-07-10 DIAGNOSIS — R030 Elevated blood-pressure reading, without diagnosis of hypertension: Secondary | ICD-10-CM

## 2022-07-10 DIAGNOSIS — R9089 Other abnormal findings on diagnostic imaging of central nervous system: Secondary | ICD-10-CM | POA: Diagnosis not present

## 2022-07-10 DIAGNOSIS — M5432 Sciatica, left side: Secondary | ICD-10-CM | POA: Diagnosis not present

## 2022-07-10 DIAGNOSIS — R42 Dizziness and giddiness: Secondary | ICD-10-CM | POA: Diagnosis not present

## 2022-07-10 MED ORDER — PREDNISONE 20 MG PO TABS: 40.0000 mg | ORAL_TABLET | Freq: Every day | ORAL | 0 refills | Status: DC

## 2022-07-10 NOTE — Progress Notes (Signed)
Established Patient Office Visit  Subjective   Patient ID: Nathan [name], male    DOB: Feb 07, 1951  Age: 71 y.o. MRN: [id number]  Chief Complaint  Patient presents with   Follow-up    Pt was in er for vertigo , pt has ct and ekg done     HPI  Follow-up from ED visit on October 2.  He reported that he was eating walnuts and then about a hour later started feeling really dizzy and started vomiting.  He reported he had had an allergy to walnuts a long time ago but did not think it was an issue anymore.  The dizziness he really described more as vertigo.  They ended up keeping him in the hospital.  He had a head CT which was negative for acute finding.  They also did an EKG.  Interestingly, about 2 weeks prior to this episode he had had some ringing in his left ear for a few days and he also felt like he could not hear out of that ear for a few hours.  In about 4 weeks ago he actually had a respiratory infection after traveling with a low-grade temperature and fever.  He says that the nausea lasted for couple more days after he was discharged home from the hospital but now he is actually feeling tremendously better.  His blood pressure was mildly elevated in the hospital.  He is having some sciatica in his left buttock cheek radiating down.    Head CT:  FINDINGS:    #  No acute intracranial hemorrhage.  #  No masses, mass effect, midline shift or hydrocephalus.  #  Chronic small vessel ischemic disease. Generalized brain atrophy.  #  Calvarium is intact.  #  Visualized orbits and globes are unremarkable without radiopaque foreign bodies.  #  Visualized paranasal sinuses are clear.  #  Visualized mastoid air cells are clear.    Also been dealing with some sciatica on the left side it starts in the buttock area and radiates towards the posterior lateral upper thigh.  It does not usually go beyond that.  Its been going on for about 6 months at this point and he really just cannot seem to  get it a lot better.    ROS    Objective:     BP (!) 141/86   Pulse 71   Ht '5\' 6"'$  (1.676 m)   Wt 175 lb (79.4 kg)   SpO2 99%   BMI 28.25 kg/m    Physical Exam   No results found for any visits on 07/10/22.    The 10-year ASCVD risk score (Arnett DK, et al., 2019) is: 45%    Assessment & Plan:   Problem List Items Addressed This Visit   None Visit Diagnoses     Elevated BP without diagnosis of hypertension    -  Primary   Left sided sciatica       Vertigo       Abnormal brain CT          Elevated blood pressure today-normally blood pressures are much better controlled usually in the 130s occasionally 140.  He did take meloxicam today.  We discussed holding that medication and seeing if the blood pressure at home looks better.  Reviewed that some NSAIDs can increase blood pressure and this certainly could be a cause.  We will need to keep an eye on this he does not normally have a diagnosis of hypertension.  Left-sided sciatica-we  discussed a round of oral prednisone.  He will need to stop all NSAIDs during that timeframe.  He can use Tylenol.  Given handout to do for sciatica.  If not improving then strongly recommend formal physical therapy.  We discussed that sometimes it can be coming from a disc issue in the spine versus compression through the piriformis muscle.  Vertigo with nausea-seems to have resolved on its own it is interesting that it started after having had episode of tinnitus and an upper respiratory infection.  But it seems to have resolved now  We also went over findings from the MRI including the microvascular ischemic changes and the atrophy.  He is currently asymptomatic he is not experiencing any significant dysfunction or memory problems etc.  We discussed potential causes for these issues and moving forward its important to just eat healthy, exercise get adequate rest.  Will monitor for new or changing symptoms.  No further work-up needed at this  time.   No follow-ups on file.   I spent 40 minutes on the day of the encounter to include pre-visit record review, face-to-face time with the patient and post visit ordering of test.   Beatrice Lecher, MD

## 2022-08-01 ENCOUNTER — Other Ambulatory Visit (HOSPITAL_COMMUNITY): Payer: Self-pay | Admitting: Psychiatry

## 2022-08-11 ENCOUNTER — Telehealth: Payer: Self-pay | Admitting: Family Medicine

## 2022-08-11 NOTE — Telephone Encounter (Signed)
Called and LVM for patient to schedule AWVS on 11/17 -MAJ

## 2022-08-12 ENCOUNTER — Other Ambulatory Visit: Payer: Self-pay | Admitting: Family Medicine

## 2022-08-12 DIAGNOSIS — R809 Proteinuria, unspecified: Secondary | ICD-10-CM

## 2022-09-10 ENCOUNTER — Other Ambulatory Visit (HOSPITAL_COMMUNITY): Payer: Self-pay | Admitting: Psychiatry

## 2022-10-26 ENCOUNTER — Ambulatory Visit (INDEPENDENT_AMBULATORY_CARE_PROVIDER_SITE_OTHER): Payer: Medicare Other

## 2022-10-26 ENCOUNTER — Ambulatory Visit (INDEPENDENT_AMBULATORY_CARE_PROVIDER_SITE_OTHER): Payer: Medicare Other | Admitting: Family Medicine

## 2022-10-26 ENCOUNTER — Encounter: Payer: Self-pay | Admitting: Family Medicine

## 2022-10-26 VITALS — BP 135/76 | HR 78 | Ht 66.0 in | Wt 172.0 lb

## 2022-10-26 DIAGNOSIS — E119 Type 2 diabetes mellitus without complications: Secondary | ICD-10-CM | POA: Diagnosis not present

## 2022-10-26 DIAGNOSIS — R252 Cramp and spasm: Secondary | ICD-10-CM

## 2022-10-26 DIAGNOSIS — M5432 Sciatica, left side: Secondary | ICD-10-CM | POA: Diagnosis not present

## 2022-10-26 DIAGNOSIS — M545 Low back pain, unspecified: Secondary | ICD-10-CM | POA: Diagnosis not present

## 2022-10-26 DIAGNOSIS — M5410 Radiculopathy, site unspecified: Secondary | ICD-10-CM | POA: Diagnosis not present

## 2022-10-26 DIAGNOSIS — M47816 Spondylosis without myelopathy or radiculopathy, lumbar region: Secondary | ICD-10-CM | POA: Diagnosis not present

## 2022-10-26 NOTE — Progress Notes (Signed)
Acute Office Visit  Subjective:     Patient ID: Nathan [name], male    DOB: 1951/08/21, 72 y.o.   MRN: [id number]  No chief complaint on file.   HPI Patient is in today for sciatica on the left side for at least 4 months. .  When I last saw him in October after an ED visit he was having a flare with left-sided sciatica.  We did a round of prednisone at that time and I gave him a handout to do some stretches and home physical therapy. He says the prednisone didn't help and he hs been doing the exercises without relief.   And encouraged him to return if symptoms did not improve. No recent imaging.  Pain is worse with prolonged sitting, prolonged walking and bending over.  He also reports that he gets frequent cramping in the left leg particularly in the calf and hamstring.  It can happen with just certain movements.  Does not typically happen with walking.  ROS      Objective:    BP 135/76   Pulse 78   Ht '5\' 6"'$  (1.676 m)   Wt 172 lb (78 kg)   SpO2 96%   BMI 27.76 kg/m    Physical Exam Vitals reviewed.  Constitutional:      Appearance: He is well-developed.  HENT:     Head: Normocephalic and atraumatic.  Eyes:     Conjunctiva/sclera: Conjunctivae normal.  Cardiovascular:     Rate and Rhythm: Normal rate.  Pulmonary:     Effort: Pulmonary effort is normal.  Musculoskeletal:     Comments: Normal flexion, extension and side bending of the waist.  Pain in left low back with rotation to the right.  Neg straight leg raise.  Hip, knee, ankle strength is 5-5 bilaterally but I do detect just a slight weakness on the left compared to the right.  Patellar reflex 1+ on the right but hyperreflexic on the left.  Skin:    General: Skin is dry.     Coloration: Skin is not pale.  Neurological:     Mental Status: He is alert and oriented to person, place, and time.  Psychiatric:        Behavior: Behavior normal.     No results found for any visits on 10/26/22.       Assessment & Plan:   Problem List Items Addressed This Visit       Nervous and Auditory   Left sided sciatica - Primary   Relevant Orders   DG Lumbar Spine Complete   VAS [place] ABI WITH/WO TBI   Other Visit Diagnoses     Muscle cramping       Relevant Orders   VAS [place] ABI WITH/WO TBI      Left-sided sciatica-he does have just some slight weakness on the left with exam compared to his right but he still pretty strong.  He also has some hyperreflexia at the patellar tendon on the left.  Negative straight leg raise.  Will start with plain film x-ray today.  He may benefit from either more imaging or formal PT.  Discussed most common cause for this is degenerative disc disease.  He did not respond to prednisone previously so we did not do another round today.  Asymmetry of patellar reflexes-consider alternative diagnoses.  He would like to go ahead and get his lab work updated that was ordered in October.  Muscle cramping - will order ABIs to evaluate for PVD.  Consider other neuromuscular causes.  No orders of the defined types were placed in this encounter.   No follow-ups on file.  Beatrice Lecher, MD

## 2022-10-27 ENCOUNTER — Other Ambulatory Visit: Payer: Self-pay | Admitting: *Deleted

## 2022-10-27 ENCOUNTER — Encounter: Payer: Self-pay | Admitting: Family Medicine

## 2022-10-27 DIAGNOSIS — M5432 Sciatica, left side: Secondary | ICD-10-CM

## 2022-10-27 LAB — BASIC METABOLIC PANEL WITH GFR
BUN: 22 mg/dL (ref 7–25)
CO2: 29 mmol/L (ref 20–32)
Calcium: 9.9 mg/dL (ref 8.6–10.3)
Chloride: 101 mmol/L (ref 98–110)
Creat: 0.8 mg/dL (ref 0.70–1.28)
Glucose, Bld: 119 mg/dL — ABNORMAL HIGH (ref 65–99)
Potassium: 4.1 mmol/L (ref 3.5–5.3)
Sodium: 138 mmol/L (ref 135–146)
eGFR: 95 mL/min/{1.73_m2} (ref >=60)

## 2022-10-27 LAB — PSA: PSA: 2.87 ng/mL (ref <=4.00)

## 2022-10-27 NOTE — Progress Notes (Signed)
[place], x-ray of your back shows disc space height loss with vacuum phenomenon at L5-S1.  So I definitely think this is the culprit of the pain going down into your leg.  They also saw some arthritis at the hinge joints at L5-S1.  So you definitely have a damaged disc that is causing a lot of your discomfort.  I would like for [place] to try formal physical therapy for at least 3 to 4 weeks to see if you are feeling better and getting some relief with that leg.  I would also like to get you in with our sports med doc here in our office in the next couple of weeks as well to see if he has any additional recommendations.  You did have a little bit of hyperreflexia in that knee on the left side that I would like him to evaluate.  That can sometimes come from other issues such as concerns in the neck or even in the brain and I would like him to take a look.  If you are okay with that then please let us know and we will get you scheduled with Dr. Aundria Mems.

## 2022-10-27 NOTE — Progress Notes (Signed)
Your lab work is within acceptable range and there are no concerning findings.   ?

## 2022-10-30 NOTE — Therapy (Signed)
OUTPATIENT PHYSICAL THERAPY THORACOLUMBAR EVALUATION   Patient Name: Nathan [name] MRN: [id number] DOB:[birth year], 72 y.o., male Today's Date: 10/31/2022  END OF SESSION:  PT End of Session - 10/31/22 0800     Visit Number 1    Number of Visits 16    Date for PT Re-Evaluation 12/26/22    Authorization Type [employer] Medicare    Progress Note Due on Visit 10    PT Start Time 0800    PT Stop Time 0845    PT Time Calculation (min) 45 min    Activity Tolerance Patient tolerated treatment well    Behavior During Therapy WFL for tasks assessed/performed             Past Medical History:  Diagnosis Date   Adenomatous colon polyp    Allergy    Anxiety    CRPS (complex regional pain syndrome), upper limb    Depression    Diabetes mellitus without complication ([place])    Diverticulitis 2013   Diverticulosis    Diverticulosis    GERD (gastroesophageal reflux disease)    Glaucoma    History of Helicobacter pylori infection    Hyperlipidemia    Hypertension    no per pt   Intestinal metaplasia of gastric mucosa    Ligament tear of upper extremity April 2008   Left elbow   Nephrolithiasis    Renal calculi    Renal disorder    Past Surgical History:  Procedure Laterality Date   LITHOTRIPSY  2004   kidney stones   REPLACEMENT TOTAL KNEE Right 2014   ROTATOR CUFF REPAIR Left 2011   ULNAR NERVE REPAIR Left 2009, 2010,   3 surgeries   Patient Active Problem List   Diagnosis Date Noted   Left sided sciatica 10/26/2022   Sebaceous cyst 07/03/2022   Microalbuminuria due to type 2 diabetes mellitus ([place]) 11/25/2021   Dyspnea 09/01/2021   Primary open angle glaucoma (POAG) of right eye, mild stage 01/29/2020   Combined forms of age-related cataract of left eye 01/22/2020   Osteoarthritis of left knee 01/05/2020   Diabetes mellitus, type II ([place]) 12/08/2016   Pain syndrome, chronic 02/06/2014   Pain in extremity 02/06/2014   Bilateral rotator cuff dysfunction 02/06/2014    Major depressive disorder, recurrent ([place]) 04/12/2012   Complete tear of left rotator cuff 08/02/2011   CAUSALGIA OF UPPER LIMB 08/02/2010   ANEMIA 05/11/2010   NEUROPATHY 08/10/2009   NEPHROLITHIASIS 05/22/2009   GERD 06/23/2008   ALLERGIC RHINITIS 01/14/2008   Hyperlipidemia 06/28/2007   FATTY LIVER DISEASE 06/28/2007   DISEASE, CYSTIC KIDNEY NOS, CONGENITAL 06/28/2007    PCP: Hali Marry, MD  REFERRING PROVIDER: Hali Marry, MD  REFERRING DIAG:  [id number] (ICD-10-CM) - Left sided sciatica    Rationale for Evaluation and Treatment: Rehabilitation  THERAPY DIAG:  Sciatica, left side  Muscle weakness (generalized)  Stiffness of left hip, not elsewhere classified  Pain in left hip  ONSET DATE: ~6 months  SUBJECTIVE:  SUBJECTIVE STATEMENT: Pt states he is an active person and pain came on insidiously. Notes issues with his L sciatic nerve. Pain comes from the back of his hip down to his feet. Pt does note problems with his knee (needs TKR). Pt reports pain is daily. Pt states he did exercises at home. They helped but did not see progress.   PERTINENT HISTORY:  DM, L knee OA  PAIN:  Are you having pain? Yes: NPRS scale: Currently 5, at worst 10/10 Pain location: L hip down to back of leg Pain description: Sharp pain Aggravating factors: Sitting after a long period and get up, stairs, lifting, walking too long (grocery stores) Relieving factors: Meloxicam  PRECAUTIONS: None  WEIGHT BEARING RESTRICTIONS: No  FALLS:  Has patient fallen in last 6 months? No  LIVING ENVIRONMENT: Lives with: lives with their family and lives with their spouse Lives in: House/apartment Stairs: Yes: Internal: 16 steps; on right going up and External: 0 steps; none Has following  equipment at home: None  OCCUPATION: Part time design for custom homes  PLOF: Independent  PATIENT GOALS: Improve pain  NEXT MD VISIT:   OBJECTIVE:   DIAGNOSTIC FINDINGS:  Lumbar x-ray 10/26/22 IMPRESSION: Moderate disc space height loss with vacuum phenomenon at L5-S1. Advanced facet arthropathy at L5-S1.    PATIENT SURVEYS:  FOTO 41; predicted 48  SCREENING FOR RED FLAGS: Bowel or bladder incontinence: No Spinal tumors: No Cauda equina syndrome: No Compression fracture: No Abdominal aneurysm: No  COGNITION: Overall cognitive status: Within functional limits for tasks assessed     SENSATION: WFL  MUSCLE LENGTH: Hamstrings: Right 75 deg; Left 50 deg in SLR Thomas test: WFL   POSTURE: rounded shoulders, forward head, and increased thoracic kyphosis  PALPATION: TTP L lumbar paraspinals, glutes, piriformis, hamstrings  LUMBAR ROM: TBA  AROM eval  Flexion   Extension   Right lateral flexion   Left lateral flexion   Right rotation   Left rotation    (Blank rows = not tested)  LOWER EXTREMITY ROM:   Did not assess  LOWER EXTREMITY MMT:    MMT Right eval Left eval  Hip flexion 5 4*  Hip extension 4- 3+*  Hip abduction 4 3+  Hip adduction    Hip internal rotation    Hip external rotation    Knee flexion 5 4-*  Knee extension 5 5  Ankle dorsiflexion    Ankle plantarflexion    Ankle inversion    Ankle eversion     (Blank rows = not tested)  LUMBAR SPECIAL TESTS:  Straight leg raise test: Positive, Slump test: Positive, FABER test: Positive, and Thomas test: Negative  FUNCTIONAL TESTS:  None assessed  GAIT: Distance walked: To back of clinic Assistive device utilized: None Level of assistance: Complete Independence Comments: WFL  TODAY'S TREATMENT:  DATE: 10/31/22 See HEP    PATIENT EDUCATION:  Education details:  Exam findings, POC, initial HEP, importance of stretching, heat, and massage for muscle tension Person educated: Patient Education method: Explanation, Demonstration, and Handouts Education comprehension: verbalized understanding, returned demonstration, and needs further education  HOME EXERCISE PROGRAM: Access Code: [email or web address] URL: [email or web address] Date: 10/31/2022 Prepared by: Estill Bamberg April Thurnell Garbe  Program Notes Use heat!  Exercises - Seated Hamstring Stretch  - 1 x daily - 7 x weekly - 2 sets - 30 sec hold - Gastroc Stretch on Wall  - 1 x daily - 7 x weekly - 2 sets - 30 sec hold - Seated Piriformis Stretch  - 1 x daily - 7 x weekly - 2 sets - 30 sec hold - Standing 'L' Stretch at Counter  - 1 x daily - 7 x weekly - 2 sets - 30 sec hold - Seated Sciatic Tensioner  - 1 x daily - 7 x weekly - 1 sets - 10 reps - Standing Glute Med Mobilization with Small Ball on Wall  - 1 x daily - 7 x weekly - 2 sets - 60 sec hold - Standing Paraspinals Mobilization with Small Ball on Wall  - 1 x daily - 7 x weekly - 2 sets - 60 sec hold  ASSESSMENT:  CLINICAL IMPRESSION: Patient is a 72 y.o. M who was seen today for physical therapy evaluation and treatment for L sciatica. Assessment significant for L sided lumbar paraspinal, glute/piriformis, hamstring and calf tightness with increased L sciatic nerve tension resulting in pain and L hip weakness. Pt would benefit from PT to address these muscular imbalances for return to PLOF.   OBJECTIVE IMPAIRMENTS: decreased mobility, difficulty walking, decreased ROM, decreased strength, increased fascial restrictions, increased muscle spasms, impaired flexibility, improper body mechanics, and pain.   ACTIVITY LIMITATIONS: lifting, bending, sitting, stairs, transfers, and locomotion level  PARTICIPATION LIMITATIONS: shopping, community activity, and yard work  PERSONAL FACTORS: Fitness, Past/current experiences, and Time since onset of  injury/illness/exacerbation are also affecting patient's functional outcome.   REHAB POTENTIAL: Good  CLINICAL DECISION MAKING: Stable/uncomplicated  EVALUATION COMPLEXITY: Low   GOALS: Goals reviewed with patient? Yes  SHORT TERM GOALS: Target date: 11/28/2022   Pt will be ind with stretching regimen Baseline: Goal status: INITIAL  2.  Pt will demo L = R hamstring length for improved neural tension Baseline:  Goal status: INITIAL   LONG TERM GOALS: Target date: 12/26/2022   Pt will be ind with management of his symptoms through stretching and strengthening exercises Baseline:  Goal status: INITIAL  2.  Pt will be able to perform stairs in reciprocal gait pattern for home mobility to demo improved hip strength Baseline:  Goal status: INITIAL  3.  Pt will report no pain with all activities Baseline:  Goal status: INITIAL  4.  Pt will have improved FOTO score to 53 Baseline:  Goal status: INITIAL    PLAN:  PT FREQUENCY: 2x/week  PT DURATION: 8 weeks  PLANNED INTERVENTIONS: Therapeutic exercises, Therapeutic activity, Neuromuscular re-education, Balance training, Gait training, Patient/Family education, Self Care, Joint mobilization, Stair training, Aquatic Therapy, Dry Needling, Spinal mobilization, Cryotherapy, Taping, Traction, Ionotophoresis '4mg'$ /ml Dexamethasone, Manual therapy, and Re-evaluation.  PLAN FOR NEXT SESSION: Assess response to HEP. Work on stretching and strengthening L LE posterior chain.    Haiden Clucas April Ma L Harlo Fabela, PT 10/31/2022, 9:13 AM

## 2022-10-31 ENCOUNTER — Other Ambulatory Visit (HOSPITAL_COMMUNITY): Payer: Self-pay | Admitting: Psychiatry

## 2022-10-31 ENCOUNTER — Other Ambulatory Visit: Payer: Self-pay

## 2022-10-31 ENCOUNTER — Ambulatory Visit: Payer: Medicare Other | Attending: Family Medicine | Admitting: Physical Therapy

## 2022-10-31 DIAGNOSIS — M25652 Stiffness of left hip, not elsewhere classified: Secondary | ICD-10-CM | POA: Diagnosis not present

## 2022-10-31 DIAGNOSIS — M5432 Sciatica, left side: Secondary | ICD-10-CM | POA: Diagnosis not present

## 2022-10-31 DIAGNOSIS — M25552 Pain in left hip: Secondary | ICD-10-CM | POA: Insufficient documentation

## 2022-10-31 DIAGNOSIS — M6281 Muscle weakness (generalized): Secondary | ICD-10-CM | POA: Diagnosis not present

## 2022-11-03 ENCOUNTER — Encounter: Payer: Self-pay | Admitting: Rehabilitative and Restorative Service Providers"

## 2022-11-03 ENCOUNTER — Ambulatory Visit: Payer: Medicare Other | Admitting: Rehabilitative and Restorative Service Providers"

## 2022-11-03 DIAGNOSIS — M25552 Pain in left hip: Secondary | ICD-10-CM | POA: Diagnosis not present

## 2022-11-03 DIAGNOSIS — M6281 Muscle weakness (generalized): Secondary | ICD-10-CM | POA: Diagnosis not present

## 2022-11-03 DIAGNOSIS — M25652 Stiffness of left hip, not elsewhere classified: Secondary | ICD-10-CM | POA: Diagnosis not present

## 2022-11-03 DIAGNOSIS — M5432 Sciatica, left side: Secondary | ICD-10-CM

## 2022-11-03 NOTE — Therapy (Signed)
OUTPATIENT PHYSICAL THERAPY THORACOLUMBAR TREATMENT  Patient Name: Nathan [name] MRN: [id number] DOB:01-26-1951, 72 y.o., male Today's Date: 11/03/2022  END OF SESSION:  PT End of Session - 11/03/22 0847     Visit Number 2    Number of Visits 16    Date for PT Re-Evaluation 12/26/22    Authorization Type [employer] Medicare    Progress Note Due on Visit 10    PT Start Time 0847    PT Stop Time 0937    PT Time Calculation (min) 50 min    Activity Tolerance Patient tolerated treatment well    Behavior During Therapy [facility] for tasks assessed/performed             Past Medical History:  Diagnosis Date   Adenomatous colon polyp    Allergy    Anxiety    CRPS (complex regional pain syndrome), upper limb    Depression    Diabetes mellitus without complication ([place])    Diverticulitis 2013   Diverticulosis    Diverticulosis    GERD (gastroesophageal reflux disease)    Glaucoma    History of Helicobacter pylori infection    Hyperlipidemia    Hypertension    no per pt   Intestinal metaplasia of gastric mucosa    Ligament tear of upper extremity April 2008   Left elbow   Nephrolithiasis    Renal calculi    Renal disorder    Past Surgical History:  Procedure Laterality Date   LITHOTRIPSY  2004   kidney stones   REPLACEMENT TOTAL KNEE Right 2014   ROTATOR CUFF REPAIR Left 2011   ULNAR NERVE REPAIR Left 2009, 2010,   3 surgeries   Patient Active Problem List   Diagnosis Date Noted   Left sided sciatica 10/26/2022   Sebaceous cyst 07/03/2022   Microalbuminuria due to type 2 diabetes mellitus ([place]) 11/25/2021   Dyspnea 09/01/2021   Primary open angle glaucoma (POAG) of right eye, mild stage 01/29/2020   Combined forms of age-related cataract of left eye 01/22/2020   Osteoarthritis of left knee 01/05/2020   Diabetes mellitus, type II ([place]) 12/08/2016   Pain syndrome, chronic 02/06/2014   Pain in extremity 02/06/2014   Bilateral rotator cuff dysfunction 02/06/2014   Major  depressive disorder, recurrent ([place]) 04/12/2012   Complete tear of left rotator cuff 08/02/2011   CAUSALGIA OF UPPER LIMB 08/02/2010   ANEMIA 05/11/2010   NEUROPATHY 08/10/2009   NEPHROLITHIASIS 05/22/2009   GERD 06/23/2008   ALLERGIC RHINITIS 01/14/2008   Hyperlipidemia 06/28/2007   FATTY LIVER DISEASE 06/28/2007   DISEASE, CYSTIC KIDNEY NOS, CONGENITAL 06/28/2007    PCP: Hali Marry, MD  REFERRING PROVIDER: Hali Marry, MD  REFERRING DIAG:  [phone] (ICD-10-CM) - Left sided sciatica   Rationale for Evaluation and Treatment: Rehabilitation  THERAPY DIAG:  Sciatica, left side  Muscle weakness (generalized)  Stiffness of left hip, not elsewhere classified  Pain in left hip  ONSET DATE: ~6 months  SUBJECTIVE:  SUBJECTIVE STATEMENT: Patient notes increased pain with home hamstring stretch (neural glide when asked further). Pain is in L posterior hip and radiates into back of leg. He does not notice a difference with exercises yet.  PERTINENT HISTORY:  DM, L knee OA  PAIN:  Are you having pain? Yes: NPRS scale: Currently 5/10, at worst 10/10 Pain location: L hip down to back of leg Pain description: Sharp pain Aggravating factors: Sitting after a long period and get up, stairs, lifting, walking too long (grocery stores) Relieving factors: Meloxicam  PRECAUTIONS: None  WEIGHT BEARING RESTRICTIONS: No  FALLS:  Has patient fallen in last 6 months? No  OCCUPATION: Part time design for custom homes  PATIENT GOALS: Improve pain  OBJECTIVE:  (Measures in this section from initial evaluation unless otherwise noted) DIAGNOSTIC FINDINGS:  Lumbar x-ray 10/26/22 IMPRESSION: Moderate disc space height loss with vacuum phenomenon at L5-S1. Advanced facet arthropathy at  L5-S1.  MUSCLE LENGTH: Hamstrings: Right 75 deg; Left 50 deg in SLR Thomas test: [facility]   POSTURE: rounded shoulders, forward head, and increased thoracic kyphosis  LUMBAR ROM: TBA   AROM eval 11/03/22  Flexion NT WFLs  Extension  WFLs  Right lateral flexion  Painful  Left lateral flexion  WFLs  Right rotation  50% radiates  Left rotation  WFLs   (Blank rows = not tested)  LOWER EXTREMITY MMT:    MMT Right eval Left eval  Hip flexion 5 4*  Hip extension 4- 3+*  Hip abduction 4 3+  Hip adduction    Hip internal rotation    Hip external rotation    Knee flexion 5 4-*  Knee extension 5 5  Ankle dorsiflexion    Ankle plantarflexion    Ankle inversion    Ankle eversion     (Blank rows = not tested)  LUMBAR SPECIAL TESTS:  Straight leg raise test: Positive, Slump test: Positive, FABER test: Positive, and Thomas test: Negative  [facility] Adult PT Treatment:                                                DATE: 11/03/22 Therapeutic Exercise: Nu-step x 2 minutes with increases pain in L hip level 5 *See lumbar AROM testing LE ROM testing-- **Highly irritable in L hip with rotation in supine (IR and ER) with sharp pain into hip Prone press ups Supine pirformis stretch-- painful Seated pifiromis stretch--painful Supine lumbar rocking PROM hip IR/ER during STM Manual Therapy: STM- STM L QL, L paraspinals, L glut med, L piriformis Joint mobilization: grade I-II PA mobs lumbar spine, PA sacral mobs  Education on DN, patient wants to wait and try STM first before DN (printed handout) Modalities: Heat and interferential e-stim to tolerance x 12 minutes L glut channel 1, and L QL channel 2 Self Care: Modified HEP based on patient report neural glide and seated piriformis too aggravating  PATIENT EDUCATION:  Education details: Exam findings, POC, initial HEP, importance of stretching, heat, and massage for muscle tension Person educated: Patient Education method: Explanation,  Demonstration, and Handouts Education comprehension: verbalized understanding, returned demonstration, and needs further education  HOME EXERCISE PROGRAM: Access Code: [email or web address] URL: [email or web address] Date: 11/03/2022 Prepared by: Rudell Cobb  Program Notes Use heat!  Exercises - Prone Press Up  - 2 x daily - 7 x weekly - 1 sets - 10 reps -  Supine Lower Trunk Rotation  - 2 x daily - 7 x weekly - 1 sets - 10 reps - Seated Hamstring Stretch  - 1 x daily - 7 x weekly - 2 sets - 30 sec hold - Gastroc Stretch on Wall  - 1 x daily - 7 x weekly - 2 sets - 30 sec hold - Standing 'L' Stretch at [employer]  - 1 x daily - 7 x weekly - 2 sets - 30 sec hold - Standing Glute Med Mobilization with Small Ball on Wall  - 1 x daily - 7 x weekly - 2 sets - 60 sec hold - Standing Paraspinals Mobilization with Small Ball on Wall  - 1 x daily - 7 x weekly - 2 sets - 60 sec hold  Patient Education - Trigger Point Dry Needling  ASSESSMENT:  CLINICAL IMPRESSION: Patient symptoms irritable today. PT checked ROM and R lateral lean + R rotation painful in L low back/QL region. Hip AROM also provokes sharp pain in L hip. PT initiated gentle exercise and modified HEP to be more tolerable. Also performed STM and e-stim + heat to reduce pain today. Plan to ask about DN for trigger point release-- provided handouts today. Plan to continue to patient tolerance.   OBJECTIVE IMPAIRMENTS: decreased mobility, difficulty walking, decreased ROM, decreased strength, increased fascial restrictions, increased muscle spasms, impaired flexibility, improper body mechanics, and pain.   GOALS: Goals reviewed with patient? Yes  SHORT TERM GOALS: Target date: 11/28/2022   Pt will be ind with stretching regimen Baseline: Goal status: INITIAL  2.  Pt will demo L = R hamstring length for improved neural tension Baseline:  Goal status: INITIAL   LONG TERM GOALS: Target date: 12/26/2022   Pt will be ind  with management of his symptoms through stretching and strengthening exercises Baseline:  Goal status: INITIAL  2.  Pt will be able to perform stairs in reciprocal gait pattern for home mobility to demo improved hip strength Baseline:  Goal status: INITIAL  3.  Pt will report no pain with all activities Baseline:  Goal status: INITIAL  4.  Pt will have improved FOTO score to 53 Baseline:  Goal status: INITIAL    PLAN:  PT FREQUENCY: 2x/week  PT DURATION: 8 weeks  PLANNED INTERVENTIONS: Therapeutic exercises, Therapeutic activity, Neuromuscular re-education, Balance training, Gait training, Patient/Family education, Self Care, Joint mobilization, Stair training, Aquatic Therapy, Dry Needling, Spinal mobilization, Cryotherapy, Taping, Traction, Ionotophoresis 8m/ml Dexamethasone, Manual therapy, and Re-evaluation.  PLAN FOR NEXT SESSION: Assess response to HEP. Work on stretching and strengthening L LE posterior chain.    WCrown PT 11/03/2022, 12:46 PM

## 2022-11-07 ENCOUNTER — Encounter: Payer: Self-pay | Admitting: Physical Therapy

## 2022-11-07 ENCOUNTER — Ambulatory Visit: Payer: Medicare Other | Admitting: Physical Therapy

## 2022-11-07 DIAGNOSIS — M25652 Stiffness of left hip, not elsewhere classified: Secondary | ICD-10-CM | POA: Diagnosis not present

## 2022-11-07 DIAGNOSIS — M6281 Muscle weakness (generalized): Secondary | ICD-10-CM | POA: Diagnosis not present

## 2022-11-07 DIAGNOSIS — M25552 Pain in left hip: Secondary | ICD-10-CM | POA: Diagnosis not present

## 2022-11-07 DIAGNOSIS — M5432 Sciatica, left side: Secondary | ICD-10-CM | POA: Diagnosis not present

## 2022-11-07 NOTE — Therapy (Signed)
OUTPATIENT PHYSICAL THERAPY THORACOLUMBAR TREATMENT  Patient Name: Nathan [name] MRN: [id number] DOB:13-Mar-1951, 72 y.o., male Today's Date: 11/07/2022  END OF SESSION:  PT End of Session - 11/07/22 0801     Visit Number 3    Number of Visits 16    Date for PT Re-Evaluation 12/26/22    Authorization Type U[employer] Medicare    Progress Note Due on Visit 10    PT Start Time 0801    PT Stop Time 0845    PT Time Calculation (min) 44 min    Activity Tolerance Patient tolerated treatment well    Behavior During Therapy WFL for tasks assessed/performed              Past Medical History:  Diagnosis Date   Adenomatous colon polyp    Allergy    Anxiety    CRPS (complex regional pain syndrome), upper limb    Depression    Diabetes mellitus without complication ([place])    Diverticulitis 2013   Diverticulosis    Diverticulosis    GERD (gastroesophageal reflux disease)    Glaucoma    History of Helicobacter pylori infection    Hyperlipidemia    Hypertension    no per pt   Intestinal metaplasia of gastric mucosa    Ligament tear of upper extremity April 2008   Left elbow   Nephrolithiasis    Renal calculi    Renal disorder    Past Surgical History:  Procedure Laterality Date   LITHOTRIPSY  2004   kidney stones   REPLACEMENT TOTAL KNEE Right 2014   ROTATOR CUFF REPAIR Left 2011   ULNAR NERVE REPAIR Left 2009, 2010,   3 surgeries   Patient Active Problem List   Diagnosis Date Noted   Left sided sciatica 10/26/2022   Sebaceous cyst 07/03/2022   Microalbuminuria due to type 2 diabetes mellitus ([place]) 11/25/2021   Dyspnea 09/01/2021   Primary open angle glaucoma (POAG) of right eye, mild stage 01/29/2020   Combined forms of age-related cataract of left eye 01/22/2020   Osteoarthritis of left knee 01/05/2020   Diabetes mellitus, type II ([place]) 12/08/2016   Pain syndrome, chronic 02/06/2014   Pain in extremity 02/06/2014   Bilateral rotator cuff dysfunction 02/06/2014    Major depressive disorder, recurrent ([place]) 04/12/2012   Complete tear of left rotator cuff 08/02/2011   CAUSALGIA OF UPPER LIMB 08/02/2010   ANEMIA 05/11/2010   NEUROPATHY 08/10/2009   NEPHROLITHIASIS 05/22/2009   GERD 06/23/2008   ALLERGIC RHINITIS 01/14/2008   Hyperlipidemia 06/28/2007   FATTY LIVER DISEASE 06/28/2007   DISEASE, CYSTIC KIDNEY NOS, CONGENITAL 06/28/2007    PCP: Hali Marry, MD  REFERRING PROVIDER: Hali Marry, MD  REFERRING DIAG:  [phone] (ICD-10-CM) - Left sided sciatica   Rationale for Evaluation and Treatment: Rehabilitation  THERAPY DIAG:  Sciatica, left side  Muscle weakness (generalized)  Stiffness of left hip, not elsewhere classified  Pain in left hip  ONSET DATE: ~6 months  SUBJECTIVE:  SUBJECTIVE STATEMENT: Pt states he has been taking it easy. Feels that things are a little bit better  PERTINENT HISTORY:  DM, L knee OA  PAIN:  Are you having pain? Yes: NPRS scale: Currently 3/10, at worst 10/10 Pain location: L hip down to back of leg Pain description: Sharp pain Aggravating factors: Sitting after a long period and get up, stairs, lifting, walking too long (grocery stores) Relieving factors: Meloxicam  PRECAUTIONS: None  WEIGHT BEARING RESTRICTIONS: No  FALLS:  Has patient fallen in last 6 months? No  OCCUPATION: Part time design for custom homes  PATIENT GOALS: Improve pain  OBJECTIVE:  (Measures in this section from initial evaluation unless otherwise noted) DIAGNOSTIC FINDINGS:  Lumbar x-ray 10/26/22 IMPRESSION: Moderate disc space height loss with vacuum phenomenon at L5-S1. Advanced facet arthropathy at L5-S1.  MUSCLE LENGTH: Hamstrings: Right 75 deg; Left 50 deg in SLR Thomas test: [facility]   POSTURE: rounded  shoulders, forward head, and increased thoracic kyphosis  LUMBAR ROM:  AROM eval 11/03/22  Flexion NT WFLs  Extension  WFLs  Right lateral flexion  Painful  Left lateral flexion  WFLs  Right rotation  50% radiates  Left rotation  WFLs   (Blank rows = not tested)  LOWER EXTREMITY MMT:    MMT Right eval Left eval  Hip flexion 5 4*  Hip extension 4- 3+*  Hip abduction 4 3+  Hip adduction    Hip internal rotation    Hip external rotation    Knee flexion 5 4-*  Knee extension 5 5  Ankle dorsiflexion    Ankle plantarflexion    Ankle inversion    Ankle eversion     (Blank rows = not tested)  LUMBAR SPECIAL TESTS:  Straight leg raise test: Positive, Slump test: Positive, FABER test: Positive, and Thomas test: Negative  [facility] Adult PT Treatment:                                                DATE: 11/07/22 Therapeutic Exercise: Prone press up x20 sec (limited due to shoulder) Prone hip ext 2x10 Prone hamstring curl x10, red TB x10 PPT 10x5 sec Supine butterfly stretch 2x30 sec Supine hamstring stretch with strap 2x30 sec PPT + heel slide into marching x10 Manual Therapy: Gentle STM & TPR lumbar paraspinal, QL and glutes Hip PROM and distraction Modalities: Heat and interferential e-stim to tolerance x 12 minutes L glut channel 1, and L QL channel 2  [facility] Adult PT Treatment:                                                DATE: 11/03/22 Therapeutic Exercise: Nu-step x 2 minutes with increases pain in L hip level 5 *See lumbar AROM testing LE ROM testing-- **Highly irritable in L hip with rotation in supine (IR and ER) with sharp pain into hip Prone press ups Supine pirformis stretch-- painful Seated pifiromis stretch--painful Supine lumbar rocking PROM hip IR/ER during STM Manual Therapy: STM- STM L QL, L paraspinals, L glut med, L piriformis Joint mobilization: grade I-II PA mobs lumbar spine, PA sacral mobs  Education on DN, patient wants to wait and try STM first  before DN (printed handout) Modalities: Heat and  interferential e-stim to tolerance x 12 minutes L glut channel 1, and L QL channel 2 Self Care: Modified HEP based on patient report neural glide and seated piriformis too aggravating  PATIENT EDUCATION:  Education details: HEP updates Person educated: Patient Education method: Explanation, Demonstration, and Handouts Education comprehension: verbalized understanding, returned demonstration, and needs further education  HOME EXERCISE PROGRAM: Access Code: [email or web address] URL: [email or web address] Date: 11/03/2022 Prepared by: Rudell Cobb  Program Notes Use heat!  Exercises - Prone Press Up  - 2 x daily - 7 x weekly - 1 sets - 10 reps - Supine Lower Trunk Rotation  - 2 x daily - 7 x weekly - 1 sets - 10 reps - Seated Hamstring Stretch  - 1 x daily - 7 x weekly - 2 sets - 30 sec hold - Gastroc Stretch on Wall  - 1 x daily - 7 x weekly - 2 sets - 30 sec hold - Standing 'L' Stretch at [employer]  - 1 x daily - 7 x weekly - 2 sets - 30 sec hold - Standing Glute Med Mobilization with Small Ball on Wall  - 1 x daily - 7 x weekly - 2 sets - 60 sec hold - Standing Paraspinals Mobilization with Small Ball on Wall  - 1 x daily - 7 x weekly - 2 sets - 60 sec hold  Patient Education - Trigger Point Dry Needling  ASSESSMENT:  CLINICAL IMPRESSION: Pt defers TPDN. Continued to work on improving hamstring and hip flexibility. Added core and hip strengthening this session. Improved hip flexion noted when pt engages in PPT with less pain.   OBJECTIVE IMPAIRMENTS: decreased mobility, difficulty walking, decreased ROM, decreased strength, increased fascial restrictions, increased muscle spasms, impaired flexibility, improper body mechanics, and pain.   GOALS: Goals reviewed with patient? Yes  SHORT TERM GOALS: Target date: 11/28/2022   Pt will be ind with stretching regimen Baseline: Goal status: INITIAL  2.  Pt will demo L = R  hamstring length for improved neural tension Baseline:  Goal status: INITIAL   LONG TERM GOALS: Target date: 12/26/2022   Pt will be ind with management of his symptoms through stretching and strengthening exercises Baseline:  Goal status: INITIAL  2.  Pt will be able to perform stairs in reciprocal gait pattern for home mobility to demo improved hip strength Baseline:  Goal status: INITIAL  3.  Pt will report no pain with all activities Baseline:  Goal status: INITIAL  4.  Pt will have improved FOTO score to 53 Baseline:  Goal status: INITIAL    PLAN:  PT FREQUENCY: 2x/week  PT DURATION: 8 weeks  PLANNED INTERVENTIONS: Therapeutic exercises, Therapeutic activity, Neuromuscular re-education, Balance training, Gait training, Patient/Family education, Self Care, Joint mobilization, Stair training, Aquatic Therapy, Dry Needling, Spinal mobilization, Cryotherapy, Taping, Traction, Ionotophoresis 84m/ml Dexamethasone, Manual therapy, and Re-evaluation.  PLAN FOR NEXT SESSION: Assess response to HEP. Work on stretching and strengthening L LE posterior chain.    Naveah Brave April Ma L Varnell Donate, PT 11/07/2022, 8:01 AM

## 2022-11-10 ENCOUNTER — Ambulatory Visit: Payer: Medicare Other

## 2022-11-10 DIAGNOSIS — M5432 Sciatica, left side: Secondary | ICD-10-CM

## 2022-11-10 DIAGNOSIS — M25652 Stiffness of left hip, not elsewhere classified: Secondary | ICD-10-CM | POA: Diagnosis not present

## 2022-11-10 DIAGNOSIS — M25552 Pain in left hip: Secondary | ICD-10-CM | POA: Diagnosis not present

## 2022-11-10 DIAGNOSIS — M6281 Muscle weakness (generalized): Secondary | ICD-10-CM

## 2022-11-10 NOTE — Therapy (Addendum)
 OUTPATIENT PHYSICAL THERAPY THORACOLUMBAR TREATMENT AND DISCHARGE  PHYSICAL THERAPY DISCHARGE SUMMARY  Visits from Start of Care: 4  Current functional level related to goals / functional outcomes: Improved pain until pt did work and re-exacerbated symptoms   Remaining deficits: Radiating back pain   Education / Equipment: See HEP   Patient agrees to discharge. Patient goals were not met. Patient is being discharged due to not returning since the last visit.  Patient Name: Nathan [name] MRN: [id number] DOB:[birth year], 72 y.o., male Today's Date: 11/10/2022  END OF SESSION:  PT End of Session - 11/10/22 0846     Visit Number 4    Number of Visits 16    Date for PT Re-Evaluation 12/26/22    Authorization Type UHC Medicare    Progress Note Due on Visit 10    PT Start Time 0847    PT Stop Time 0934    PT Time Calculation (min) 47 min    Activity Tolerance Patient tolerated treatment well    Behavior During Therapy [facility] for tasks assessed/performed              Past Medical History:  Diagnosis Date   Adenomatous colon polyp    Allergy    Anxiety    CRPS (complex regional pain syndrome), upper limb    Depression    Diabetes mellitus without complication ([facility])    Diverticulitis 2013   Diverticulosis    Diverticulosis    GERD (gastroesophageal reflux disease)    Glaucoma    History of Helicobacter pylori infection    Hyperlipidemia    Hypertension    no per pt   Intestinal metaplasia of gastric mucosa    Ligament tear of upper extremity April 2008   Left elbow   Nephrolithiasis    Renal calculi    Renal disorder    Past Surgical History:  Procedure Laterality Date   LITHOTRIPSY  2004   kidney stones   REPLACEMENT TOTAL KNEE Right 2014   ROTATOR CUFF REPAIR Left 2011   ULNAR NERVE REPAIR Left 2009, 2010,   3 surgeries   Patient Active Problem List   Diagnosis Date Noted   Left sided sciatica 10/26/2022   Sebaceous cyst 07/03/2022    Microalbuminuria due to type 2 diabetes mellitus ([facility]) 11/25/2021   Dyspnea 09/01/2021   Primary open angle glaucoma (POAG) of right eye, mild stage 01/29/2020   Combined forms of age-related cataract of left eye 01/22/2020   Osteoarthritis of left knee 01/05/2020   Diabetes mellitus, type II ([facility]) 12/08/2016   Pain syndrome, chronic 02/06/2014   Pain in extremity 02/06/2014   Bilateral rotator cuff dysfunction 02/06/2014   Major depressive disorder, recurrent ([facility]) 04/12/2012   Complete tear of left rotator cuff 08/02/2011   CAUSALGIA OF UPPER LIMB 08/02/2010   ANEMIA 05/11/2010   NEUROPATHY 08/10/2009   NEPHROLITHIASIS 05/22/2009   GERD 06/23/2008   ALLERGIC RHINITIS 01/14/2008   Hyperlipidemia 06/28/2007   FATTY LIVER DISEASE 06/28/2007   DISEASE, CYSTIC KIDNEY NOS, CONGENITAL 06/28/2007    PCP: Agapito Games, MD  REFERRING PROVIDER: Agapito Games, MD  REFERRING DIAG:  [phone] (ICD-10-CM) - Left sided sciatica   Rationale for Evaluation and Treatment: Rehabilitation  THERAPY DIAG:  Sciatica, left side  Muscle weakness (generalized)  Pain in left hip  Stiffness of left hip, not elsewhere classified  ONSET DATE: ~6 months  SUBJECTIVE:  SUBJECTIVE STATEMENT: Patient reports he felt fine after last visit until he did some work and the pain came back to where he needed medication amd rest.   PERTINENT HISTORY:  DM, L knee OA  PAIN:  Are you having pain? Yes: NPRS scale: Currently 3/10, at worst 10/10 Pain location: L hip down to back of leg Pain description: Sharp pain Aggravating factors: Sitting after a long period and get up, stairs, lifting, walking too long (grocery stores) Relieving factors: Meloxicam  PRECAUTIONS: None  WEIGHT BEARING RESTRICTIONS:  No  FALLS:  Has patient fallen in last 6 months? No  OCCUPATION: Part time design for custom homes  PATIENT GOALS: Improve pain  OBJECTIVE:  (Measures in this section from initial evaluation unless otherwise noted) DIAGNOSTIC FINDINGS:  Lumbar x-ray 10/26/22 IMPRESSION: Moderate disc space height loss with vacuum phenomenon at L5-S1. Advanced facet arthropathy at L5-S1.  MUSCLE LENGTH: Hamstrings: Right 75 deg; Left 50 deg in SLR Thomas test: [name]   POSTURE: rounded shoulders, forward head, and increased thoracic kyphosis  LUMBAR ROM:  AROM eval 11/03/22  Flexion NT WFLs  Extension  WFLs  Right lateral flexion  Painful  Left lateral flexion  WFLs  Right rotation  50% radiates  Left rotation  WFLs   (Blank rows = not tested)  LOWER EXTREMITY MMT:    MMT Right eval Left eval  Hip flexion 5 4*  Hip extension 4- 3+*  Hip abduction 4 3+  Hip adduction    Hip internal rotation    Hip external rotation    Knee flexion 5 4-*  Knee extension 5 5  Ankle dorsiflexion    Ankle plantarflexion    Ankle inversion    Ankle eversion     (Blank rows = not tested)  LUMBAR SPECIAL TESTS:  Straight leg raise test: Positive, Slump test: Positive, FABER test: Positive, and Thomas test: Negative   [facility] Adult PT Treatment:                                                DATE: 11/10/2022 Therapeutic Exercise: Prone prop on elbows x2 min + cervical nods Prone hip ext 2x10  Prone windshield wiper legs Supine --> seated sciatic nerve glide (L) Supine dynamic HS stretch (L) x10 PPT 10x5 sec Supine --> seated diaphragmatic breathing + abdominal bracing Prone HS curls RTB (L) 2x10 Supine hamstring stretch with strap 2x30 sec Supine butterfly stretch 2x30 sec Modified piriformis stretch (L) Manual Therapy: L hip LAD    [facility] Adult PT Treatment:                                                DATE: 11/07/22 Therapeutic Exercise: Prone press up x20 sec (limited due to shoulder) Prone  hip ext 2x10 Prone hamstring curl x10, red TB x10 PPT 10x5 sec Supine butterfly stretch 2x30 sec Supine hamstring stretch with strap 2x30 sec PPT + heel slide into marching x10 Manual Therapy: Gentle STM & TPR lumbar paraspinal, QL and glutes Hip PROM and distraction Modalities: Heat and interferential e-stim to tolerance x 12 minutes L glut channel 1, and L QL channel 2    PATIENT EDUCATION:  Education details: Diaphragmatic breathing Person educated: Patient Education method:  Explanation, Demonstration, and Handouts Education comprehension: verbalized understanding, returned demonstration, and needs further education  HOME EXERCISE PROGRAM: Access Code: [email or web address] URL: [email or web address] Date: 11/10/2022 Prepared by: Carlynn Herald  Program Notes Use heat!  Exercises - Prone Press Up  - 2 x daily - 7 x weekly - 1 sets - 10 reps - Supine Lower Trunk Rotation  - 2 x daily - 7 x weekly - 1 sets - 10 reps - Seated Hamstring Stretch  - 1 x daily - 7 x weekly - 2 sets - 30 sec hold - Gastroc Stretch on Wall  - 1 x daily - 7 x weekly - 2 sets - 30 sec hold - Standing 'L' Stretch at [employer]  - 1 x daily - 7 x weekly - 2 sets - 30 sec hold - Standing Glute Med Mobilization with Small Ball on Wall  - 1 x daily - 7 x weekly - 2 sets - 60 sec hold - Standing Paraspinals Mobilization with Small Ball on Wall  - 1 x daily - 7 x weekly - 2 sets - 60 sec hold - Supine Posterior Pelvic Tilt  - 1 x daily - 7 x weekly - 2 sets - 10 reps - Supine March with Posterior Pelvic Tilt  - 1 x daily - 7 x weekly - 2 sets - 10 reps - Prone Hip Extension with Bent Knee  - 1 x daily - 7 x weekly - 2 sets - 10 reps - Prone Knee Flexion  - 1 x daily - 7 x weekly - 2 sets - 10 reps - Diaphragmatic Breathing at 90/90 Supported  - 1 x daily - 7 x weekly - 3 sets - 10 reps - Supine Transversus Abdominis Bracing - Hands on Ground  - 1 x daily - 7 x weekly - 3 sets - 10  reps  ASSESSMENT:  CLINICAL IMPRESSION: Diaphragmatic breathing and abdominal bracing techniques performed, providing tactile and verbal cues to promote proprioceptive awareness. Initial flare up with sciatic pain exhibited during supine nerve glide, however decreasing ROM and breathing cues decreased pain and muscle guarding.   OBJECTIVE IMPAIRMENTS: decreased mobility, difficulty walking, decreased ROM, decreased strength, increased fascial restrictions, increased muscle spasms, impaired flexibility, improper body mechanics, and pain.   GOALS: Goals reviewed with patient? Yes  SHORT TERM GOALS: Target date: 11/28/2022  Pt will be ind with stretching regimen Baseline: Goal status: INITIAL  2.  Pt will demo L = R hamstring length for improved neural tension Baseline:  Goal status: INITIAL   LONG TERM GOALS: Target date: 12/26/2022  Pt will be ind with management of his symptoms through stretching and strengthening exercises Baseline:  Goal status: INITIAL  2.  Pt will be able to perform stairs in reciprocal gait pattern for home mobility to demo improved hip strength Baseline:  Goal status: INITIAL  3.  Pt will report no pain with all activities Baseline:  Goal status: INITIAL  4.  Pt will have improved FOTO score to 53 Baseline:  Goal status: INITIAL    PLAN:  PT FREQUENCY: 2x/week  PT DURATION: 8 weeks  PLANNED INTERVENTIONS: Therapeutic exercises, Therapeutic activity, Neuromuscular re-education, Balance training, Gait training, Patient/Family education, Self Care, Joint mobilization, Stair training, Aquatic Therapy, Dry Needling, Spinal mobilization, Cryotherapy, Taping, Traction, Ionotophoresis 4mg /ml Dexamethasone, Manual therapy, and Re-evaluation.  PLAN FOR NEXT SESSION: Update FOTO. Work on stretching and strengthening L LE posterior chain.    Sanjuana Mae, PTA 11/10/2022, 9:35 AM  Vernon Prey April Ma  L Nonato, PT, DPT 12/03/2023, 1:11 PM

## 2022-11-14 ENCOUNTER — Encounter: Payer: Medicare Other | Admitting: Physical Therapy

## 2022-12-06 ENCOUNTER — Other Ambulatory Visit (HOSPITAL_COMMUNITY): Payer: Self-pay | Admitting: Psychiatry

## 2022-12-17 ENCOUNTER — Other Ambulatory Visit: Payer: Self-pay | Admitting: Family Medicine

## 2022-12-17 DIAGNOSIS — E785 Hyperlipidemia, unspecified: Secondary | ICD-10-CM

## 2022-12-26 ENCOUNTER — Ambulatory Visit: Payer: Medicare Other | Admitting: Family Medicine

## 2023-01-01 ENCOUNTER — Other Ambulatory Visit (HOSPITAL_COMMUNITY): Payer: Self-pay | Admitting: Psychiatry

## 2023-01-17 ENCOUNTER — Encounter: Payer: Self-pay | Admitting: Family Medicine

## 2023-01-17 ENCOUNTER — Ambulatory Visit (INDEPENDENT_AMBULATORY_CARE_PROVIDER_SITE_OTHER): Payer: Medicare Other | Admitting: Family Medicine

## 2023-01-17 VITALS — BP 137/75 | HR 74 | Temp 98.2°F | Ht 66.0 in | Wt 178.0 lb

## 2023-01-17 DIAGNOSIS — R319 Hematuria, unspecified: Secondary | ICD-10-CM

## 2023-01-17 DIAGNOSIS — N200 Calculus of kidney: Secondary | ICD-10-CM | POA: Diagnosis not present

## 2023-01-17 DIAGNOSIS — M5432 Sciatica, left side: Secondary | ICD-10-CM | POA: Diagnosis not present

## 2023-01-17 LAB — POCT URINALYSIS DIP (CLINITEK)
Bilirubin, UA: NEGATIVE
Blood, UA: NEGATIVE
Glucose, UA: NEGATIVE mg/dL
Ketones, POC UA: NEGATIVE mg/dL
Leukocytes, UA: NEGATIVE
Nitrite, UA: NEGATIVE
POC PROTEIN,UA: NEGATIVE
Spec Grav, UA: 1.02 (ref 1.010–1.025)
Urobilinogen, UA: 0.2 E.U./dL
pH, UA: 6 (ref 5.0–8.0)

## 2023-01-17 NOTE — Progress Notes (Signed)
Acute Office Visit  Subjective:     Patient ID: Nathan [name], male    DOB: October 18, 1950, 72 y.o.   MRN: [id number]  Chief Complaint  Patient presents with   Nephrolithiasis    Pt reports that he passed a kidney stone    HPI Patient is in today for episode of kidney stone.  The last time he passed a kidney stone was probably 8 years ago he has had lithotripsy in the past and used to follow with [facility].  He did try to call their office but found that he had actually transferred to a different location and health system.  He says yesterday he passed a little bit of blood and had some pain but not nearly as severe as he has had in the past with passing kidney stones and later on he did pass a stone.  He does feel like he hydrates well.  He says he is here today because he is mostly worried about infection and he is just had some chills.  No abdominal pain or back pain.  And no blood in the urine today.  Still struggling with some left-sided sciatica.  He did do some physical therapy and just feels like he really has not had a lot of improvement and would like to consider next steps.  Previously seen at [facility] orthopedics for his shoulder  ROS      Objective:    BP 137/75   Pulse 74   Temp 98.2 F (36.8 C)   Ht  (1.676 m)   Wt 178 lb (80.7 kg)   SpO2 96%   BMI 28.73 kg/m    Physical Exam Vitals reviewed.  Constitutional:      Appearance: He is well-developed.  HENT:     Head: Normocephalic and atraumatic.  Eyes:     Conjunctiva/sclera: Conjunctivae normal.  Cardiovascular:     Rate and Rhythm: Normal rate.  Pulmonary:     Effort: Pulmonary effort is normal.  Abdominal:     General: Bowel sounds are normal.     Palpations: Abdomen is soft. There is no mass.     Tenderness: There is no abdominal tenderness.  Musculoskeletal:     Comments: No CVA tenderness.  He is a little bit tender over his mid low back over the muscles.  Skin:    General: Skin is dry.      Coloration: Skin is not pale.  Neurological:     Mental Status: He is alert and oriented to person, place, and time.  Psychiatric:        Behavior: Behavior normal.     Results for orders placed or performed in visit on 01/17/23  POCT URINALYSIS DIP (CLINITEK)  Result Value Ref Range   Color, UA yellow yellow   Clarity, UA clear clear   Glucose, UA negative negative mg/dL   Bilirubin, UA negative negative   Ketones, POC UA negative negative mg/dL   Spec Grav, UA 4.098 1.191 - 1.025   Blood, UA negative negative   pH, UA 6.0 5.0 - 8.0   POC PROTEIN,UA negative negative, trace   Urobilinogen, UA 0.2 0.2 or 1.0 E.U./dL   Nitrite, UA Negative Negative   Leukocytes, UA Negative Negative        Assessment & Plan:   Problem List Items Addressed This Visit       Nervous and Auditory   Left sided sciatica   Relevant Orders   Ambulatory referral to Orthopedic Surgery  Other Visit Diagnoses     Kidney stone    -  Primary   Hematuria, unspecified type       Relevant Orders   POCT URINALYSIS DIP (CLINITEK) (Completed)      Kidney stone-it sounds like it passed successfully and he is feeling a little bit better today he just wanted to make sure he did not have an infection. UA is normal.    Hemturia - resolved.    Left-sided sciatica-we will go ahead and place referral to the orthopedic group over at [facility] which is where he was previously seen for his shoulder issues.  Next labs will likely include further workup with an MRI.    No orders of the defined types were placed in this encounter.   No follow-ups on file.  Nani Gasser, MD

## 2023-01-25 ENCOUNTER — Other Ambulatory Visit (HOSPITAL_COMMUNITY): Payer: Self-pay | Admitting: Psychiatry

## 2023-01-25 ENCOUNTER — Encounter: Payer: Self-pay | Admitting: Family Medicine

## 2023-01-25 ENCOUNTER — Ambulatory Visit (INDEPENDENT_AMBULATORY_CARE_PROVIDER_SITE_OTHER): Payer: Medicare Other | Admitting: Family Medicine

## 2023-01-25 VITALS — BP 112/73 | HR 78 | Ht 66.0 in | Wt 174.0 lb

## 2023-01-25 DIAGNOSIS — M79605 Pain in left leg: Secondary | ICD-10-CM | POA: Diagnosis not present

## 2023-01-25 DIAGNOSIS — E119 Type 2 diabetes mellitus without complications: Secondary | ICD-10-CM | POA: Diagnosis not present

## 2023-01-25 DIAGNOSIS — E785 Hyperlipidemia, unspecified: Secondary | ICD-10-CM

## 2023-01-25 DIAGNOSIS — R809 Proteinuria, unspecified: Secondary | ICD-10-CM

## 2023-01-25 DIAGNOSIS — E1129 Type 2 diabetes mellitus with other diabetic kidney complication: Secondary | ICD-10-CM | POA: Diagnosis not present

## 2023-01-25 LAB — POCT GLYCOSYLATED HEMOGLOBIN (HGB A1C): Hemoglobin A1C: 6.2 % — AB (ref 4.0–5.6)

## 2023-01-25 LAB — POCT UA - MICROALBUMIN
Creatinine, POC: 50 mg/dL
Microalbumin Ur, POC: 80 mg/L

## 2023-01-25 MED ORDER — ATORVASTATIN CALCIUM 40 MG PO TABS: 40.0000 mg | ORAL_TABLET | Freq: Every day | ORAL | 3 refills | Status: DC

## 2023-01-25 MED ORDER — LOSARTAN POTASSIUM 25 MG PO TABS
ORAL_TABLET | ORAL | 3 refills | Status: DC
Start: 2023-01-25 — End: 2023-12-26

## 2023-01-25 NOTE — Patient Instructions (Signed)
Come in on or after June 1st for FASTING LABS.

## 2023-01-25 NOTE — Assessment & Plan Note (Signed)
A1c looks great today at 6.2 which is up a little bit from prior at 6.0.  But otherwise doing well.  Plan follow-up in 6 months.  Continue with [facility] for microalbuminuria.

## 2023-01-25 NOTE — Assessment & Plan Note (Signed)
He has an appointment with the orthopedist tomorrow for the right-sided sciatica so hopefully will get a chance to get in with them and have that addressed tomorrow.

## 2023-01-25 NOTE — Progress Notes (Signed)
Established Patient Office Visit  Subjective   Patient ID: Nathan [name], male    DOB: 01/26/1951  Age: 72 y.o. MRN: [id number]  No chief complaint on file.   HPI  Diabetes - no hypoglycemic events. No wounds or sores that are not healing well. No increased thirst or urination. Checking glucose at home. Taking medications as prescribed without any side effects.  Ran Out of the losartan recently and wondered if he needed to continue to take it.    ROS    Objective:     BP 112/73   Pulse 78   Ht 5\' 6"  (1.676 m)   Wt 174 lb (78.9 kg)   SpO2 98%   BMI 28.08 kg/m    Physical Exam Constitutional:      Appearance: He is well-developed.  HENT:     Head: Normocephalic and atraumatic.  Cardiovascular:     Rate and Rhythm: Normal rate and regular rhythm.     Heart sounds: Normal heart sounds.  Pulmonary:     Effort: Pulmonary effort is normal.     Breath sounds: Normal breath sounds.  Skin:    General: Skin is warm and dry.  Neurological:     Mental Status: He is alert and oriented to person, place, and time.  Psychiatric:        Behavior: Behavior normal.      Results for orders placed or performed in visit on 01/25/23  POCT glycosylated hemoglobin (Hb A1C)  Result Value Ref Range   Hemoglobin A1C 6.2 (A) 4.0 - 5.6 %   HbA1c POC (<> result, manual entry)     HbA1c, POC (prediabetic range)     HbA1c, POC (controlled diabetic range)    POCT UA - Microalbumin  Result Value Ref Range   Microalbumin Ur, POC 80 mg/L   Creatinine, POC 50 mg/dL   Albumin/Creatinine Ratio, Urine, POC 30-300       The 10-year ASCVD risk score (Arnett DK, et al., 2019) is: 32.7%    Assessment & Plan:   Problem List Items Addressed This Visit       Endocrine   Microalbuminuria due to type 2 diabetes mellitus (HCC)    Continue daily losartan.      Relevant Medications   losartan (COZAAR) 25 MG tablet   atorvastatin (LIPITOR) 40 MG tablet   Other Relevant Orders    Lipid Panel w/reflex Direct LDL   TSH   COMPLETE METABOLIC PANEL WITH GFR   Diabetes mellitus, type II (HCC) - Primary    A1c looks great today at 6.2 which is up a little bit from prior at 6.0.  But otherwise doing well.  Plan follow-up in 6 months.  Continue with ARB for microalbuminuria.      Relevant Medications   losartan (COZAAR) 25 MG tablet   atorvastatin (LIPITOR) 40 MG tablet   Other Relevant Orders   POCT glycosylated hemoglobin (Hb A1C) (Completed)   POCT UA - Microalbumin (Completed)   Lipid Panel w/reflex Direct LDL   TSH   COMPLETE METABOLIC PANEL WITH GFR     Other   Pain in extremity    He has an appointment with the orthopedist tomorrow for the right-sided sciatica so hopefully will get a chance to get in with them and have that addressed tomorrow.      Hyperlipidemia   Relevant Medications   losartan (COZAAR) 25 MG tablet   atorvastatin (LIPITOR) 40 MG tablet   Other Visit Diagnoses  Urine test positive for microalbuminuria       Relevant Medications   losartan (COZAAR) 25 MG tablet       Return in about 6 months (around 07/28/2023) for Diabetes follow-up.    Nani Gasser, MD

## 2023-01-25 NOTE — Assessment & Plan Note (Signed)
Continue daily losartan.

## 2023-01-26 DIAGNOSIS — M4316 Spondylolisthesis, lumbar region: Secondary | ICD-10-CM | POA: Diagnosis not present

## 2023-01-26 DIAGNOSIS — M47816 Spondylosis without myelopathy or radiculopathy, lumbar region: Secondary | ICD-10-CM | POA: Diagnosis not present

## 2023-01-26 DIAGNOSIS — M5459 Other low back pain: Secondary | ICD-10-CM | POA: Diagnosis not present

## 2023-01-26 DIAGNOSIS — M5416 Radiculopathy, lumbar region: Secondary | ICD-10-CM | POA: Diagnosis not present

## 2023-01-26 NOTE — Addendum Note (Signed)
Addended by: Nani Gasser D on: 01/26/2023 12:57 PM   Modules accepted: Level of Service

## 2023-01-28 ENCOUNTER — Other Ambulatory Visit (HOSPITAL_COMMUNITY): Payer: Self-pay | Admitting: Psychiatry

## 2023-02-01 ENCOUNTER — Telehealth: Payer: Self-pay | Admitting: Family Medicine

## 2023-02-01 NOTE — Telephone Encounter (Signed)
Called patient to schedule Medicare Annual Wellness Visit (AWV). Left message for patient to call back and schedule Medicare Annual Wellness Visit (AWV).  Last date of AWV: 2019  Please schedule an appointment at any time with [facility].  If any questions, please contact me at [phone].  Thank you ,  Morgan Jessup Patient Access Advocate II Direct Dial: [phone]   

## 2023-02-14 DIAGNOSIS — M4726 Other spondylosis with radiculopathy, lumbar region: Secondary | ICD-10-CM | POA: Diagnosis not present

## 2023-02-14 DIAGNOSIS — M5137 Other intervertebral disc degeneration, lumbosacral region: Secondary | ICD-10-CM | POA: Diagnosis not present

## 2023-02-14 DIAGNOSIS — M5117 Intervertebral disc disorders with radiculopathy, lumbosacral region: Secondary | ICD-10-CM | POA: Diagnosis not present

## 2023-02-14 DIAGNOSIS — M4727 Other spondylosis with radiculopathy, lumbosacral region: Secondary | ICD-10-CM | POA: Diagnosis not present

## 2023-02-14 DIAGNOSIS — M5136 Other intervertebral disc degeneration, lumbar region: Secondary | ICD-10-CM | POA: Diagnosis not present

## 2023-02-14 DIAGNOSIS — M47816 Spondylosis without myelopathy or radiculopathy, lumbar region: Secondary | ICD-10-CM | POA: Diagnosis not present

## 2023-02-14 DIAGNOSIS — M47817 Spondylosis without myelopathy or radiculopathy, lumbosacral region: Secondary | ICD-10-CM | POA: Diagnosis not present

## 2023-02-14 DIAGNOSIS — M4316 Spondylolisthesis, lumbar region: Secondary | ICD-10-CM | POA: Diagnosis not present

## 2023-02-14 DIAGNOSIS — M4807 Spinal stenosis, lumbosacral region: Secondary | ICD-10-CM | POA: Diagnosis not present

## 2023-02-14 DIAGNOSIS — M48061 Spinal stenosis, lumbar region without neurogenic claudication: Secondary | ICD-10-CM | POA: Diagnosis not present

## 2023-02-23 DIAGNOSIS — M47816 Spondylosis without myelopathy or radiculopathy, lumbar region: Secondary | ICD-10-CM | POA: Diagnosis not present

## 2023-02-23 DIAGNOSIS — M5416 Radiculopathy, lumbar region: Secondary | ICD-10-CM | POA: Diagnosis not present

## 2023-02-28 DIAGNOSIS — M25561 Pain in right knee: Secondary | ICD-10-CM | POA: Diagnosis not present

## 2023-02-28 DIAGNOSIS — M1712 Unilateral primary osteoarthritis, left knee: Secondary | ICD-10-CM | POA: Diagnosis not present

## 2023-02-28 DIAGNOSIS — M25562 Pain in left knee: Secondary | ICD-10-CM | POA: Diagnosis not present

## 2023-03-16 DIAGNOSIS — M47816 Spondylosis without myelopathy or radiculopathy, lumbar region: Secondary | ICD-10-CM | POA: Diagnosis not present

## 2023-03-16 DIAGNOSIS — M5416 Radiculopathy, lumbar region: Secondary | ICD-10-CM | POA: Diagnosis not present

## 2023-04-04 DIAGNOSIS — M4316 Spondylolisthesis, lumbar region: Secondary | ICD-10-CM | POA: Diagnosis not present

## 2023-04-04 DIAGNOSIS — M5416 Radiculopathy, lumbar region: Secondary | ICD-10-CM | POA: Diagnosis not present

## 2023-04-04 DIAGNOSIS — M47816 Spondylosis without myelopathy or radiculopathy, lumbar region: Secondary | ICD-10-CM | POA: Diagnosis not present

## 2023-04-10 ENCOUNTER — Other Ambulatory Visit: Payer: Self-pay | Admitting: Sports Medicine

## 2023-04-22 ENCOUNTER — Other Ambulatory Visit (HOSPITAL_COMMUNITY): Payer: Self-pay | Admitting: Psychiatry

## 2023-05-07 ENCOUNTER — Other Ambulatory Visit (HOSPITAL_COMMUNITY): Payer: Self-pay | Admitting: Psychiatry

## 2023-05-16 DIAGNOSIS — M47816 Spondylosis without myelopathy or radiculopathy, lumbar region: Secondary | ICD-10-CM | POA: Diagnosis not present

## 2023-05-30 DIAGNOSIS — M1712 Unilateral primary osteoarthritis, left knee: Secondary | ICD-10-CM | POA: Diagnosis not present

## 2023-07-10 ENCOUNTER — Other Ambulatory Visit (HOSPITAL_COMMUNITY): Payer: Self-pay | Admitting: Psychiatry

## 2023-07-25 ENCOUNTER — Other Ambulatory Visit (HOSPITAL_COMMUNITY): Payer: Self-pay | Admitting: Psychiatry

## 2023-07-30 ENCOUNTER — Ambulatory Visit: Payer: Medicare Other | Admitting: Family Medicine

## 2023-07-30 NOTE — Progress Notes (Deleted)
   Established Patient Office Visit  Subjective   Patient ID: Nathan [name], male    DOB: 01-04-1951  Age: 72 y.o. MRN: [id number]  No chief complaint on file.   HPI  Diabetes - no hypoglycemic events. No wounds or sores that are not healing well. No increased thirst or urination. Checking glucose at home. Taking medications as prescribed without any side effects.   {History (Optional):23778}  ROS    Objective:     There were no vitals taken for this visit. {Vitals History (Optional):23777}  Physical Exam   No results found for any visits on 07/30/23.  {Labs (Optional):23779}  The 10-year ASCVD risk score (Arnett DK, et al., 2019) is: 34.6%    Assessment & Plan:   Problem List Items Addressed This Visit       Endocrine   Microalbuminuria due to type 2 diabetes mellitus (HCC)   Diabetes mellitus, type II (HCC) - Primary    No follow-ups on file.    Nani Gasser, MD

## 2023-08-06 ENCOUNTER — Telehealth (HOSPITAL_COMMUNITY): Payer: Self-pay | Admitting: Psychiatry

## 2023-08-06 ENCOUNTER — Telehealth (HOSPITAL_COMMUNITY): Payer: Self-pay | Admitting: *Deleted

## 2023-08-06 NOTE — Telephone Encounter (Signed)
This patient has not been seen since December, 2022.   Called LM that he would need to schedule appointment . That no refills will be given prior to appt

## 2023-08-06 NOTE — Telephone Encounter (Signed)
Patient returned call regarding medication refill and need for appointment prior to refill. Patient insists he has had an appointment with provider prior to 08/26/2021 reflected in record as last visit. Patient scheduled for in-office appointment for one hour 09/04/2023. Patient states he has only two pills remaining of sertraline (ZOLOFT) 100 MG tablet. Requests refill be sent to   [id number] - [place], [place] - [address] (Ph: [phone])   Last ordered: 04/23/2023 - 90 tablets.   Last visit: 08/26/2021  Next visit: 09/04/2023

## 2023-08-07 MED ORDER — SERTRALINE HCL 100 MG PO TABS: 100.0000 mg | ORAL_TABLET | Freq: Every day | ORAL | 0 refills | Status: DC

## 2023-08-07 MED ORDER — TRAZODONE HCL 50 MG PO TABS: 50.0000 mg | ORAL_TABLET | Freq: Every day | ORAL | 0 refills | Status: DC

## 2023-08-07 NOTE — Telephone Encounter (Signed)
Zoloft isn't showing in the med list  only the   sertraline (ZOLOFT) 100 MG tablet Take 1 tablet by mouth once daily Dispense: 90 tablet, Refills: 0 ordered

## 2023-08-07 NOTE — Telephone Encounter (Signed)
HERE'S HIS MED HISTORY --PROVIDER WILL NEED TO SEND REFILL OUTDATED Take 1 tablet (100 mg total) by mouth daily. Dispense: 30 tablet,  Refills: 1 ordered  05/18/2014

## 2023-08-07 NOTE — Addendum Note (Signed)
Addended by: Thresa Ross on: 08/07/2023 02:19 PM   Modules accepted: Orders

## 2023-08-09 ENCOUNTER — Ambulatory Visit (INDEPENDENT_AMBULATORY_CARE_PROVIDER_SITE_OTHER): Payer: Medicare Other | Admitting: Family Medicine

## 2023-08-09 ENCOUNTER — Encounter: Payer: Self-pay | Admitting: Family Medicine

## 2023-08-09 VITALS — BP 128/58 | HR 76 | Ht 66.0 in | Wt 176.0 lb

## 2023-08-09 DIAGNOSIS — E119 Type 2 diabetes mellitus without complications: Secondary | ICD-10-CM

## 2023-08-09 DIAGNOSIS — R809 Proteinuria, unspecified: Secondary | ICD-10-CM | POA: Diagnosis not present

## 2023-08-09 DIAGNOSIS — F5101 Primary insomnia: Secondary | ICD-10-CM | POA: Diagnosis not present

## 2023-08-09 DIAGNOSIS — E1129 Type 2 diabetes mellitus with other diabetic kidney complication: Secondary | ICD-10-CM | POA: Diagnosis not present

## 2023-08-09 DIAGNOSIS — F3341 Major depressive disorder, recurrent, in partial remission: Secondary | ICD-10-CM | POA: Diagnosis not present

## 2023-08-09 DIAGNOSIS — G4700 Insomnia, unspecified: Secondary | ICD-10-CM | POA: Insufficient documentation

## 2023-08-09 DIAGNOSIS — K760 Fatty (change of) liver, not elsewhere classified: Secondary | ICD-10-CM

## 2023-08-09 LAB — POCT GLYCOSYLATED HEMOGLOBIN (HGB A1C): Hemoglobin A1C: 6.9 % — AB (ref 4.0–5.6)

## 2023-08-09 MED ORDER — BUSPIRONE HCL 10 MG PO TABS: 10.0000 mg | ORAL_TABLET | Freq: Every day | ORAL | 0 refills | Status: DC

## 2023-08-09 NOTE — Patient Instructions (Signed)
Remember to get your shingles vaccine done at the local pharmacy.  It is free with Medicare as long as it is filed under your Medicare part D.

## 2023-08-09 NOTE — Assessment & Plan Note (Signed)
Currently follows with Dr. Gilmore Laroche.  Interestingly his morning headaches have completely gone away after he ran out of his Zoloft for a week.  He is can I discussed this with Dr. Gilmore Laroche when he sees him.  He does need a refill on the BuSpar between now and then.

## 2023-08-09 NOTE — Assessment & Plan Note (Signed)
Gust the importance of staying on the losartan 25 mg.

## 2023-08-09 NOTE — Assessment & Plan Note (Signed)
Treated with trazodone by Dr. Gilmore Laroche.

## 2023-08-09 NOTE — Assessment & Plan Note (Signed)
A1c looks great today at 6.4 though it is up slightly from prior at 6.2 just be mindful especially as we enter into the holiday season.  Make sure staying well active as he can that he is scheduled for knee replacement next month.

## 2023-08-09 NOTE — Assessment & Plan Note (Signed)
To recheck liver function.

## 2023-08-09 NOTE — Progress Notes (Signed)
Established Patient Office Visit  Subjective   Patient ID: Nathan [name], male    DOB: 1951-08-28  Age: 72 y.o. MRN: [id number]  Chief Complaint  Patient presents with   Diabetes    HPI  Diabetes - no hypoglycemic events. No wounds or sores that are not healing well. No increased thirst or urination. Checking glucose at home. Taking medications as prescribed without any side effects.  F/U MDD -he sees Dr. Gilmore Laroche for his mood and is currently on sertraline 100 mg and BuSpar.  He did want to let me know that he did run out of the sertraline he called and they said he needed an appointment and he has made wanted send about 3 weeks.  But he did run out for about a week and noticed that his morning headaches that he was experiencing completely went away off of the medication.  Right now he is just been taking the BuSpar as needed which she does feel is helpful and works well for him.  F/U insomnia -he currently uses trazodone for sleep with Dr. Gilmore Laroche.  Scheduled for knee replacement in December.     ROS    Objective:     BP (!) 128/58   Pulse 76   Ht 5\' 6"  (1.676 m)   Wt 176 lb (79.8 kg)   SpO2 98%   BMI 28.41 kg/m    Physical Exam Vitals and nursing note reviewed.  Constitutional:      Appearance: Normal appearance.  HENT:     Head: Normocephalic and atraumatic.  Eyes:     Conjunctiva/sclera: Conjunctivae normal.  Cardiovascular:     Rate and Rhythm: Normal rate and regular rhythm.  Pulmonary:     Effort: Pulmonary effort is normal.     Breath sounds: Normal breath sounds.  Skin:    General: Skin is warm and dry.  Neurological:     Mental Status: He is alert.  Psychiatric:        Mood and Affect: Mood normal.      Results for orders placed or performed in visit on 08/09/23  POCT HgB A1C  Result Value Ref Range   Hemoglobin A1C 6.9 (A) 4.0 - 5.6 %   HbA1c POC (<> result, manual entry)     HbA1c, POC (prediabetic range)     HbA1c, POC (controlled  diabetic range)        The 10-year ASCVD risk score (Arnett DK, et al., 2019) is: 41.7%    Assessment & Plan:   Problem List Items Addressed This Visit       Digestive   Fatty liver    To recheck liver function.        Endocrine   Microalbuminuria due to type 2 diabetes mellitus (HCC)    Gust the importance of staying on the losartan 25 mg.      Relevant Orders   CMP14+EGFR   TSH   Lipid Panel With LDL/HDL Ratio   Diabetes mellitus, type II (HCC) - Primary    A1c looks great today at 6.4 though it is up slightly from prior at 6.2 just be mindful especially as we enter into the holiday season.  Make sure staying well active as he can that he is scheduled for knee replacement next month.      Relevant Orders   POCT HgB A1C (Completed)   CMP14+EGFR   TSH   Lipid Panel With LDL/HDL Ratio     Other   Major depressive disorder, recurrent (  HCC)    Currently follows with Dr. Gilmore Laroche.  Interestingly his morning headaches have completely gone away after he ran out of his Zoloft for a week.  He is can I discussed this with Dr. Gilmore Laroche when he sees him.  He does need a refill on the BuSpar between now and then.      Relevant Medications   busPIRone (BUSPAR) 10 MG tablet   Insomnia    Treated with trazodone by Dr. Gilmore Laroche.       Return in about 4 months (around 12/07/2023) for Diabetes follow-up.    Nani Gasser, MD

## 2023-08-10 LAB — LIPID PANEL WITH LDL/HDL RATIO
Cholesterol, Total: 156 mg/dL (ref 100–199)
HDL: 45 mg/dL (ref >39)
LDL Chol Calc (NIH): 69 mg/dL (ref 0–99)
LDL/HDL Ratio: 1.5 ratio (ref 0.0–3.6)
Triglycerides: 260 mg/dL — ABNORMAL HIGH (ref 0–149)
VLDL Cholesterol Cal: 42 mg/dL — ABNORMAL HIGH (ref 5–40)

## 2023-08-10 LAB — CMP14+EGFR
ALT: 22 [IU]/L (ref 0–44)
AST: 25 [IU]/L (ref 0–40)
Albumin: 4.7 g/dL (ref 3.8–4.8)
Alkaline Phosphatase: 154 [IU]/L — ABNORMAL HIGH (ref 44–121)
BUN/Creatinine Ratio: 23 (ref 10–24)
BUN: 21 mg/dL (ref 8–27)
Bilirubin Total: 1.1 mg/dL (ref 0.0–1.2)
CO2: 23 mmol/L (ref 20–29)
Calcium: 10.1 mg/dL (ref 8.6–10.2)
Chloride: 101 mmol/L (ref 96–106)
Creatinine, Ser: 0.93 mg/dL (ref 0.76–1.27)
Globulin, Total: 2.8 g/dL (ref 1.5–4.5)
Glucose: 142 mg/dL — ABNORMAL HIGH (ref 70–99)
Potassium: 4.9 mmol/L (ref 3.5–5.2)
Sodium: 141 mmol/L (ref 134–144)
Total Protein: 7.5 g/dL (ref 6.0–8.5)
eGFR: 87 mL/min/{1.73_m2} (ref >59)

## 2023-08-10 LAB — TSH: TSH: 1.81 u[IU]/mL (ref 0.450–4.500)

## 2023-08-10 NOTE — Progress Notes (Signed)
Nathan [name], your metabolic panel overall looks good except your alkaline phosphatase, which is a liver enzyme, is slightly elevated.  The additional liver enzymes are normal so that is reassuring but I do want a keep an eye on it and plan to recheck again in 6 months.  Total cholesterol and LDL look great.  Your cholesterol look much better compared to a year ago so great work!  Thyroid level looks great.

## 2023-08-29 DIAGNOSIS — I10 Essential (primary) hypertension: Secondary | ICD-10-CM | POA: Diagnosis not present

## 2023-08-29 DIAGNOSIS — Z01818 Encounter for other preprocedural examination: Secondary | ICD-10-CM | POA: Diagnosis not present

## 2023-08-29 DIAGNOSIS — F5101 Primary insomnia: Secondary | ICD-10-CM | POA: Diagnosis not present

## 2023-08-29 DIAGNOSIS — E119 Type 2 diabetes mellitus without complications: Secondary | ICD-10-CM | POA: Diagnosis not present

## 2023-08-29 DIAGNOSIS — M1712 Unilateral primary osteoarthritis, left knee: Secondary | ICD-10-CM | POA: Diagnosis not present

## 2023-08-29 DIAGNOSIS — E785 Hyperlipidemia, unspecified: Secondary | ICD-10-CM | POA: Diagnosis not present

## 2023-09-01 ENCOUNTER — Other Ambulatory Visit (HOSPITAL_COMMUNITY): Payer: Self-pay | Admitting: Psychiatry

## 2023-09-03 DIAGNOSIS — M1712 Unilateral primary osteoarthritis, left knee: Secondary | ICD-10-CM | POA: Diagnosis not present

## 2023-09-04 ENCOUNTER — Encounter (HOSPITAL_COMMUNITY): Payer: Self-pay | Admitting: Psychiatry

## 2023-09-04 ENCOUNTER — Ambulatory Visit (HOSPITAL_COMMUNITY): Payer: Medicare Other | Admitting: Psychiatry

## 2023-09-04 ENCOUNTER — Other Ambulatory Visit (HOSPITAL_COMMUNITY): Payer: Self-pay | Admitting: Psychiatry

## 2023-09-04 VITALS — BP 137/87 | HR 82 | Ht 66.0 in | Wt 173.0 lb

## 2023-09-04 DIAGNOSIS — F411 Generalized anxiety disorder: Secondary | ICD-10-CM | POA: Diagnosis not present

## 2023-09-04 DIAGNOSIS — F5104 Psychophysiologic insomnia: Secondary | ICD-10-CM | POA: Diagnosis not present

## 2023-09-04 DIAGNOSIS — F320 Major depressive disorder, single episode, mild: Secondary | ICD-10-CM | POA: Diagnosis not present

## 2023-09-04 NOTE — Progress Notes (Signed)
Psychiatric Initial Adult Assessment   Patient Identification: Nathan [name] MRN:  [id number] Date of Evaluation:  09/04/2023 Referral Source: primary care and himself Chief Complaint:   Chief Complaint  Patient presents with   New Patient (Initial Visit)   Visit Diagnosis:    ICD-10-CM   1. Current mild episode of major depressive disorder, unspecified whether recurrent (HCC)  F32.0     2. GAD (generalized anxiety disorder)  F41.1     3. 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  Last visit was 2 years ago he has episodes of depression in the past but as of now has been doing reasonable denies depression sad days on a day-to-day basis does not endorse any significant symptoms of despair sadness or hopelessness  He was getting refills and ran out of his medication nearly more than a month ago and has not been on sertraline since then has noticed that he is not having headaches prior to that he was having headaches on a regular basis he has not reported these headaches 2 years ago or prior to that when he was on sertraline.  He feels that not having a headache he does not want to get back on sertraline and denies episodes of depression as of now or feeling of despair or sadness on a day-to-day basis  He does have worries get agitated somewhat at times and feels that at times his worries are excessive he is taking BuSpar at 1-2 times a day but in general he is only takes it once a day if need or on a  regular basis if he is having anxiety symptoms he does worry about regular worries and also has pain in his right knee getting a surgery done some worries related to finances and relationship with his wife as he tries to keep things in order and he is organized and if things are not in that way it upsets him  There is no associated psychotic symptoms currently or in the past there is no associated manic symptoms currently or in the past  He does suffer from difficulty sleeping or maintaining sleep and trazodone has been helping he is back on trazodone at does help sometimes does not take it or half a tablet if need to   Aggravating factors; if he sees a collector, finances  Modifying factors; he watches television, he is retired    Duration adult life Severity depression is manageable anxiety varies  Denies suicide attempt Associated Signs/Symptoms: Depression Symptoms:  insomnia, fatigue, difficulty concentrating, anxiety, (Hypo) Manic Symptoms:  Distractibility, Anxiety Symptoms:  Excessive Worry, Psychotic Symptoms:   denies   Past Psychiatric History: depression, anxiety   Previous Psychotropic Medications: Yes   Substance Abuse History in the last 12 months:  Yes.    Consequences of Substance Abuse: Few beers a week. Discussed effect to mood and depression  Past Medical History:  Past Medical History:  Diagnosis Date   Adenomatous colon polyp    Allergy  Anxiety    CRPS (complex regional pain syndrome), upper limb    Depression    Diabetes mellitus without complication (HCC)    Diverticulitis 2013   Diverticulosis    Diverticulosis    GERD (gastroesophageal reflux disease)    Glaucoma    History of Helicobacter pylori infection    Hyperlipidemia    Hypertension    no per pt   Intestinal metaplasia of gastric mucosa    Ligament tear of upper extremity April 2008   Left elbow   Nephrolithiasis    Renal calculi    Renal disorder     Past Surgical  History:  Procedure Laterality Date   LITHOTRIPSY  2004   kidney stones   REPLACEMENT TOTAL KNEE Right 2014   ROTATOR CUFF REPAIR Left 2011   ULNAR NERVE REPAIR Left 2009, 2010,   3 surgeries    Family Psychiatric History: denies  Family History:  Family History  Problem Relation Age of Onset   Kidney disease Mother    Lung cancer Father 2   Colon cancer Neg Hx    Esophageal cancer Neg Hx    Rectal cancer Neg Hx    Stomach cancer Neg Hx    Colon polyps Neg Hx     Social History:   Social History   Socioeconomic History   Marital status: Married    Spouse name: Not on file   Number of children: 1   Years of education: 16   Highest education level: Bachelor's degree (e.g., BA, AB, BS)  Occupational History   Occupation: AIRCRAFT INT [employer]    Employer: [employer]    Comment: Retired.   Tobacco Use   Smoking status: Former    Current packs/day: 0.00    Average packs/day: 1 pack/day for 10.0 years (10.0 ttl pk-yrs)    Types: Cigarettes    Start date: 09/26/1971    Quit date: 09/25/1981    Years since quitting: 41.9   Smokeless tobacco: Never  Vaping Use   Vaping status: Never Used  Substance and Sexual Activity   Alcohol use: Not Currently    Alcohol/week: 0.0 - 2.0 standard drinks of alcohol   Drug use: No   Sexual activity: Yes    Partners: Female  Other Topics Concern   Not on file  Social History Narrative   Some exercise. Caffeine daily. Plays chess to keep brain stimulated   Social Determinants of Health   Financial Resource Strain: Low Risk  (01/23/2023)   Received from [facility], [facility]   Overall Financial Resource Strain (CARDIA)    Difficulty of Paying Living Expenses: Not very hard  Food Insecurity: No Food Insecurity (01/23/2023)   Received from [facility], [facility]   Hunger Vital Sign    Worried About Running Out of Food in the Last Year: Never true    Ran Out of Food in the Last Year: Never true  Transportation Needs: No  Transportation Needs (01/23/2023)   Received from [facility], [facility]   PRAPARE - Transportation    Lack of Transportation (Medical): No    Lack of Transportation (Non-Medical): No  Physical Activity: Sufficiently Active (01/23/2023)   [facility]   Exercise Vital Sign    Days of Exercise per Week: 3 days    Minutes of Exercise per Session: 60 min  Stress: No Stress Concern Present (01/23/2023)   Received from [facility], [facility]ational  Stress Questionnaire    Feeling of Stress : Only a little  Social Connections: Moderately Integrated (01/23/2023)   Received from [facility], [facility]   Social Network    How would you rate your social network (family, work, friends)?: Adequate participation with social networks    Additional Social History: married and retired, has one son  Allergies:   Allergies  Allergen Reactions   Adhesive [Tape]     Hives on area wear tape applied   Hydrocodone-Acetaminophen Itching    Metabolic Disorder Labs: Lab Results  Component Value Date   HGBA1C 6.9 (A) 08/09/2023   MPG 134 11/22/2021   MPG 120 08/28/2019   No results found for: "PROLACTIN" Lab Results  Component Value Date   CHOL 156 08/09/2023   TRIG 260 (H) 08/09/2023   HDL 45 08/09/2023   CHOLHDL 4.4 02/22/2022   VLDL 45 (H) 06/14/2016   LDLCALC 69 08/09/2023   LDLCALC  02/22/2022     Comment:     . LDL cholesterol not calculated. Triglyceride levels greater than 400 mg/dL invalidate calculated LDL results. . Reference range: <100 . Desirable range <100 mg/dL for primary prevention;   <70 mg/dL for patients with CHD or diabetic patients  with > or = 2 CHD risk factors. [name] LDL-C is now calculated using the Martin-Hopkins  calculation, which is a validated novel method providing  better accuracy than the Friedewald equation in the  estimation of LDL-C.  Horald Pollen et al.  Lenox Ahr. 8841;660(63): 2061-2068  (http://education.[facility].com/faq/FAQ164)    Lab Results  Component Value Date   TSH 1.810 08/09/2023    Therapeutic Level Labs: No results found for: "LITHIUM" No results found for: "CBMZ" No results found for: "VALPROATE"  Current Medications: Current Outpatient Medications  Medication Sig Dispense Refill   atorvastatin (LIPITOR) 40 MG tablet Take 1 tablet (40 mg total) by mouth daily. 90 tablet 3   Blood Glucose Monitoring Suppl (ONE TOUCH ULTRA SYSTEM KIT) w/Device KIT For testing blood sugars daily Dx: E11.9 1 each 0   busPIRone (BUSPAR) 10 MG tablet Take 1 tablet (10 mg total) by mouth daily. 30 tablet 0   glucose blood (ONE TOUCH ULTRA TEST) test strip USE 1 STRIP TO CHECK GLUCOSE ONCE DAILY 100 each 0   losartan (COZAAR) 25 MG tablet TAKE 1 TABLET BY MOUTH ONCE DAILY FOR  MICROALBUMINURIA 90 tablet 3   meloxicam (MOBIC) 15 MG tablet TAKE 1 TABLET BY MOUTH IN THE MORNING WITH MEALS FOR 2 WEEKS THEN ONCE DAILY AS NEEDED 30 tablet 0   Multiple Vitamins-Minerals (CENTRUM ADULTS PO) Take 1 tablet by mouth daily.     OneTouch Delica Lancets 30G MISC 1 strip by Other route in the morning. 100 each PRN   sertraline (ZOLOFT) 100 MG tablet Take 1 tablet (100 mg total) by mouth daily. 30 tablet 0   traZODone (DESYREL) 50 MG tablet TAKE 1 TABLET BY MOUTH AT BEDTIME 30 tablet 0   No current facility-administered medications for this visit.     Psychiatric Specialty Exam: Review of Systems  Cardiovascular:  Negative for chest pain.  Musculoskeletal:  Positive for arthralgias.  Neurological:  Negative for tremors.  Psychiatric/Behavioral:  Positive for sleep disturbance.     Blood pressure 137/87, pulse 82, height 5\' 6"  (1.676 m), weight 173 lb (78.5 kg).Body mass index is 27.92 kg/m.  General Appearance: Casual  Eye Contact:  Fair  Speech:  Normal Rate  Volume:  Decreased  Mood:  Euthymic  Affect:  Congruent  Thought Process:  Goal  Directed  Orientation:  Full (Time, Place, and Person)  Thought Content:  Rumination  Suicidal Thoughts:  No  Homicidal Thoughts:  No  Memory:  Immediate;   Fair  Judgement:  Fair  Insight:  Fair  Psychomotor Activity:  Normal  Concentration:  Concentration: Fair  Recall:  Fair  Fund of Knowledge:Good  Language: Good  Akathisia:  No  Handed:    AIMS (if indicated):  not done  Assets:  Desire for Improvement Financial Resources/Insurance  ADL's:  Intact  Cognition: WNL  Sleep:  variable   Screenings: GAD-7    Flowsheet Row Office Visit from 09/04/2023 in [facility] at [facility] Office Visit from 09/05/2019 in [facility] at [facility] Office Visit from 03/18/2019 in [facility] Care & Sports Medicine at [facility]  Total GAD-7 Score 6 5 0      PHQ2-9    Flowsheet Row Office Visit from 09/04/2023 in [facility] at [facility] Office Visit from 01/17/2023 in [facility] Medicine at [name] Office Visit from 07/10/2022 in [facility] Primary Care & Sports Medicine at [facility] Office Visit from 06/26/2022 in [facility] Primary Care & Sports Medicine at [facility] Office Visit from 11/22/2021 in [facility] & [facility] at [facility]  PHQ-2 Total Score 1 0 0 1 0  PHQ-9 Total Score 2 -- -- 1 --      Flowsheet Row Office Visit from 09/04/2023 in [facility] at [facility] ED from 10/29/2021 in [facility] at [facility] Video Visit from 08/26/2021 in [facility] at [facility]  C-SSRS RISK CATEGORY No Risk No Risk No Risk       Assessment and Plan: as follows  Major depressive disorder recurrent as of now in remission or mild  Patient has been doing  reasonable for the last 1 month without sertraline discussed risk and benefits that without medication there is chance of recurrence of depression.  He will notify us in case he needs to get back since he was having headaches before and not having headaches now he is holding off on medication call back earlier in case there is concern  Generalized anxiety disorder continue BuSpar 1-2 a day he does have a refill now discussed risk and side effects  Insomnia discussed and reviewed sleep hygiene continue trazodone at night  Patient planning to have a knee surgery he does have a supportive wife at times relationship can be challenging but in general voices supportive and has taken a week or 2 of to help him recover from the surgery  Questions addressed medication reviewed hold off from sertraline follow-up in 3 to 4 months or earlier if needed patient to call back earlier in case he wants to get started back on sertraline may be a smaller dose or in case there is any symptoms worsening Direct care time 60 min including chart review, documentation and face to face Collaboration of Care: Primary Care Provider AEB notes and chart reviewed  Patient/Guardian was advised Release of Information must be obtained prior to any record release in order to collaborate their care with an outside provider. Patient/Guardian was advised if they have not already done so to contact the registration department to sign all necessary forms in order for [place] to release information regarding their care.  Consent: Patient/Guardian gives verbal consent for treatment and assignment of benefits for services provided during this visit. Patient/Guardian expressed understanding and agreed to proceed.   Thresa Ross, MD 12/10/202411:27 AM

## 2023-09-07 ENCOUNTER — Encounter: Payer: Self-pay | Admitting: Family Medicine

## 2023-09-07 DIAGNOSIS — M25662 Stiffness of left knee, not elsewhere classified: Secondary | ICD-10-CM | POA: Diagnosis not present

## 2023-09-07 DIAGNOSIS — Z471 Aftercare following joint replacement surgery: Secondary | ICD-10-CM | POA: Diagnosis not present

## 2023-09-07 DIAGNOSIS — M1712 Unilateral primary osteoarthritis, left knee: Secondary | ICD-10-CM | POA: Diagnosis not present

## 2023-09-07 DIAGNOSIS — E119 Type 2 diabetes mellitus without complications: Secondary | ICD-10-CM | POA: Diagnosis not present

## 2023-09-07 DIAGNOSIS — Z96651 Presence of right artificial knee joint: Secondary | ICD-10-CM | POA: Diagnosis not present

## 2023-09-07 DIAGNOSIS — G8918 Other acute postprocedural pain: Secondary | ICD-10-CM | POA: Diagnosis not present

## 2023-09-07 DIAGNOSIS — G90512 Complex regional pain syndrome I of left upper limb: Secondary | ICD-10-CM | POA: Diagnosis not present

## 2023-09-07 DIAGNOSIS — M25762 Osteophyte, left knee: Secondary | ICD-10-CM | POA: Diagnosis not present

## 2023-09-07 DIAGNOSIS — Z96652 Presence of left artificial knee joint: Secondary | ICD-10-CM | POA: Diagnosis not present

## 2023-09-07 DIAGNOSIS — Z87891 Personal history of nicotine dependence: Secondary | ICD-10-CM | POA: Diagnosis not present

## 2023-09-07 DIAGNOSIS — Z91048 Other nonmedicinal substance allergy status: Secondary | ICD-10-CM | POA: Diagnosis not present

## 2023-09-07 DIAGNOSIS — R2689 Other abnormalities of gait and mobility: Secondary | ICD-10-CM | POA: Diagnosis not present

## 2023-09-07 DIAGNOSIS — E785 Hyperlipidemia, unspecified: Secondary | ICD-10-CM | POA: Diagnosis not present

## 2023-09-07 DIAGNOSIS — Z91040 Latex allergy status: Secondary | ICD-10-CM | POA: Diagnosis not present

## 2023-09-07 DIAGNOSIS — E1140 Type 2 diabetes mellitus with diabetic neuropathy, unspecified: Secondary | ICD-10-CM | POA: Diagnosis not present

## 2023-09-07 DIAGNOSIS — Z79899 Other long term (current) drug therapy: Secondary | ICD-10-CM | POA: Diagnosis not present

## 2023-09-07 DIAGNOSIS — I10 Essential (primary) hypertension: Secondary | ICD-10-CM | POA: Diagnosis not present

## 2023-09-07 DIAGNOSIS — Z885 Allergy status to narcotic agent status: Secondary | ICD-10-CM | POA: Diagnosis not present

## 2023-09-07 HISTORY — PX: REPLACEMENT TOTAL KNEE: SUR1224

## 2023-09-08 DIAGNOSIS — M25662 Stiffness of left knee, not elsewhere classified: Secondary | ICD-10-CM | POA: Diagnosis not present

## 2023-09-08 DIAGNOSIS — Z885 Allergy status to narcotic agent status: Secondary | ICD-10-CM | POA: Diagnosis not present

## 2023-09-08 DIAGNOSIS — E119 Type 2 diabetes mellitus without complications: Secondary | ICD-10-CM | POA: Diagnosis not present

## 2023-09-08 DIAGNOSIS — Z91048 Other nonmedicinal substance allergy status: Secondary | ICD-10-CM | POA: Diagnosis not present

## 2023-09-08 DIAGNOSIS — E785 Hyperlipidemia, unspecified: Secondary | ICD-10-CM | POA: Diagnosis not present

## 2023-09-08 DIAGNOSIS — M25762 Osteophyte, left knee: Secondary | ICD-10-CM | POA: Diagnosis not present

## 2023-09-08 DIAGNOSIS — Z87891 Personal history of nicotine dependence: Secondary | ICD-10-CM | POA: Diagnosis not present

## 2023-09-08 DIAGNOSIS — Z91040 Latex allergy status: Secondary | ICD-10-CM | POA: Diagnosis not present

## 2023-09-08 DIAGNOSIS — Z79899 Other long term (current) drug therapy: Secondary | ICD-10-CM | POA: Diagnosis not present

## 2023-09-08 DIAGNOSIS — I10 Essential (primary) hypertension: Secondary | ICD-10-CM | POA: Diagnosis not present

## 2023-09-08 DIAGNOSIS — M1712 Unilateral primary osteoarthritis, left knee: Secondary | ICD-10-CM | POA: Diagnosis not present

## 2023-09-08 DIAGNOSIS — E1140 Type 2 diabetes mellitus with diabetic neuropathy, unspecified: Secondary | ICD-10-CM | POA: Diagnosis not present

## 2023-09-08 DIAGNOSIS — Z96651 Presence of right artificial knee joint: Secondary | ICD-10-CM | POA: Diagnosis not present

## 2023-09-08 DIAGNOSIS — G90512 Complex regional pain syndrome I of left upper limb: Secondary | ICD-10-CM | POA: Diagnosis not present

## 2023-09-08 DIAGNOSIS — R2689 Other abnormalities of gait and mobility: Secondary | ICD-10-CM | POA: Diagnosis not present

## 2023-09-09 DIAGNOSIS — E785 Hyperlipidemia, unspecified: Secondary | ICD-10-CM | POA: Diagnosis not present

## 2023-09-09 DIAGNOSIS — Z91040 Latex allergy status: Secondary | ICD-10-CM | POA: Diagnosis not present

## 2023-09-09 DIAGNOSIS — M25662 Stiffness of left knee, not elsewhere classified: Secondary | ICD-10-CM | POA: Diagnosis not present

## 2023-09-09 DIAGNOSIS — R2689 Other abnormalities of gait and mobility: Secondary | ICD-10-CM | POA: Diagnosis not present

## 2023-09-09 DIAGNOSIS — Z87891 Personal history of nicotine dependence: Secondary | ICD-10-CM | POA: Diagnosis not present

## 2023-09-09 DIAGNOSIS — Z96651 Presence of right artificial knee joint: Secondary | ICD-10-CM | POA: Diagnosis not present

## 2023-09-09 DIAGNOSIS — E1140 Type 2 diabetes mellitus with diabetic neuropathy, unspecified: Secondary | ICD-10-CM | POA: Diagnosis not present

## 2023-09-09 DIAGNOSIS — G90512 Complex regional pain syndrome I of left upper limb: Secondary | ICD-10-CM | POA: Diagnosis not present

## 2023-09-09 DIAGNOSIS — I10 Essential (primary) hypertension: Secondary | ICD-10-CM | POA: Diagnosis not present

## 2023-09-09 DIAGNOSIS — E119 Type 2 diabetes mellitus without complications: Secondary | ICD-10-CM | POA: Diagnosis not present

## 2023-09-09 DIAGNOSIS — Z91048 Other nonmedicinal substance allergy status: Secondary | ICD-10-CM | POA: Diagnosis not present

## 2023-09-09 DIAGNOSIS — Z885 Allergy status to narcotic agent status: Secondary | ICD-10-CM | POA: Diagnosis not present

## 2023-09-09 DIAGNOSIS — M1712 Unilateral primary osteoarthritis, left knee: Secondary | ICD-10-CM | POA: Diagnosis not present

## 2023-09-09 DIAGNOSIS — Z79899 Other long term (current) drug therapy: Secondary | ICD-10-CM | POA: Diagnosis not present

## 2023-09-09 DIAGNOSIS — M25762 Osteophyte, left knee: Secondary | ICD-10-CM | POA: Diagnosis not present

## 2023-09-10 DIAGNOSIS — I10 Essential (primary) hypertension: Secondary | ICD-10-CM | POA: Diagnosis not present

## 2023-09-10 DIAGNOSIS — N200 Calculus of kidney: Secondary | ICD-10-CM | POA: Diagnosis not present

## 2023-09-10 DIAGNOSIS — M25562 Pain in left knee: Secondary | ICD-10-CM | POA: Diagnosis not present

## 2023-09-10 DIAGNOSIS — E785 Hyperlipidemia, unspecified: Secondary | ICD-10-CM | POA: Diagnosis not present

## 2023-09-10 DIAGNOSIS — M545 Low back pain, unspecified: Secondary | ICD-10-CM | POA: Diagnosis not present

## 2023-09-10 DIAGNOSIS — Z96652 Presence of left artificial knee joint: Secondary | ICD-10-CM | POA: Diagnosis not present

## 2023-09-10 DIAGNOSIS — Z7982 Long term (current) use of aspirin: Secondary | ICD-10-CM | POA: Diagnosis not present

## 2023-09-10 DIAGNOSIS — E1142 Type 2 diabetes mellitus with diabetic polyneuropathy: Secondary | ICD-10-CM | POA: Diagnosis not present

## 2023-09-10 DIAGNOSIS — Z79891 Long term (current) use of opiate analgesic: Secondary | ICD-10-CM | POA: Diagnosis not present

## 2023-09-10 DIAGNOSIS — Z471 Aftercare following joint replacement surgery: Secondary | ICD-10-CM | POA: Diagnosis not present

## 2023-09-10 DIAGNOSIS — Z96651 Presence of right artificial knee joint: Secondary | ICD-10-CM | POA: Diagnosis not present

## 2023-09-10 DIAGNOSIS — Z87891 Personal history of nicotine dependence: Secondary | ICD-10-CM | POA: Diagnosis not present

## 2023-09-10 DIAGNOSIS — M5136 Other intervertebral disc degeneration, lumbar region with discogenic back pain only: Secondary | ICD-10-CM | POA: Diagnosis not present

## 2023-09-12 DIAGNOSIS — Z87891 Personal history of nicotine dependence: Secondary | ICD-10-CM | POA: Diagnosis not present

## 2023-09-12 DIAGNOSIS — Z471 Aftercare following joint replacement surgery: Secondary | ICD-10-CM | POA: Diagnosis not present

## 2023-09-12 DIAGNOSIS — M25562 Pain in left knee: Secondary | ICD-10-CM | POA: Diagnosis not present

## 2023-09-12 DIAGNOSIS — Z96652 Presence of left artificial knee joint: Secondary | ICD-10-CM | POA: Diagnosis not present

## 2023-09-12 DIAGNOSIS — Z96651 Presence of right artificial knee joint: Secondary | ICD-10-CM | POA: Diagnosis not present

## 2023-09-12 DIAGNOSIS — N200 Calculus of kidney: Secondary | ICD-10-CM | POA: Diagnosis not present

## 2023-09-12 DIAGNOSIS — M545 Low back pain, unspecified: Secondary | ICD-10-CM | POA: Diagnosis not present

## 2023-09-12 DIAGNOSIS — I10 Essential (primary) hypertension: Secondary | ICD-10-CM | POA: Diagnosis not present

## 2023-09-12 DIAGNOSIS — Z7982 Long term (current) use of aspirin: Secondary | ICD-10-CM | POA: Diagnosis not present

## 2023-09-12 DIAGNOSIS — Z79891 Long term (current) use of opiate analgesic: Secondary | ICD-10-CM | POA: Diagnosis not present

## 2023-09-12 DIAGNOSIS — E1142 Type 2 diabetes mellitus with diabetic polyneuropathy: Secondary | ICD-10-CM | POA: Diagnosis not present

## 2023-09-12 DIAGNOSIS — E785 Hyperlipidemia, unspecified: Secondary | ICD-10-CM | POA: Diagnosis not present

## 2023-09-12 DIAGNOSIS — M5136 Other intervertebral disc degeneration, lumbar region with discogenic back pain only: Secondary | ICD-10-CM | POA: Diagnosis not present

## 2023-09-17 DIAGNOSIS — Z7982 Long term (current) use of aspirin: Secondary | ICD-10-CM | POA: Diagnosis not present

## 2023-09-17 DIAGNOSIS — M25562 Pain in left knee: Secondary | ICD-10-CM | POA: Diagnosis not present

## 2023-09-17 DIAGNOSIS — Z471 Aftercare following joint replacement surgery: Secondary | ICD-10-CM | POA: Diagnosis not present

## 2023-09-17 DIAGNOSIS — M545 Low back pain, unspecified: Secondary | ICD-10-CM | POA: Diagnosis not present

## 2023-09-17 DIAGNOSIS — Z87891 Personal history of nicotine dependence: Secondary | ICD-10-CM | POA: Diagnosis not present

## 2023-09-17 DIAGNOSIS — E1142 Type 2 diabetes mellitus with diabetic polyneuropathy: Secondary | ICD-10-CM | POA: Diagnosis not present

## 2023-09-17 DIAGNOSIS — E785 Hyperlipidemia, unspecified: Secondary | ICD-10-CM | POA: Diagnosis not present

## 2023-09-17 DIAGNOSIS — M5136 Other intervertebral disc degeneration, lumbar region with discogenic back pain only: Secondary | ICD-10-CM | POA: Diagnosis not present

## 2023-09-17 DIAGNOSIS — Z96652 Presence of left artificial knee joint: Secondary | ICD-10-CM | POA: Diagnosis not present

## 2023-09-17 DIAGNOSIS — I10 Essential (primary) hypertension: Secondary | ICD-10-CM | POA: Diagnosis not present

## 2023-09-17 DIAGNOSIS — Z79891 Long term (current) use of opiate analgesic: Secondary | ICD-10-CM | POA: Diagnosis not present

## 2023-09-17 DIAGNOSIS — N200 Calculus of kidney: Secondary | ICD-10-CM | POA: Diagnosis not present

## 2023-09-17 DIAGNOSIS — Z96651 Presence of right artificial knee joint: Secondary | ICD-10-CM | POA: Diagnosis not present

## 2023-09-21 DIAGNOSIS — Z471 Aftercare following joint replacement surgery: Secondary | ICD-10-CM | POA: Diagnosis not present

## 2023-09-21 DIAGNOSIS — M5136 Other intervertebral disc degeneration, lumbar region with discogenic back pain only: Secondary | ICD-10-CM | POA: Diagnosis not present

## 2023-09-21 DIAGNOSIS — E1142 Type 2 diabetes mellitus with diabetic polyneuropathy: Secondary | ICD-10-CM | POA: Diagnosis not present

## 2023-09-21 DIAGNOSIS — M25562 Pain in left knee: Secondary | ICD-10-CM | POA: Diagnosis not present

## 2023-09-21 DIAGNOSIS — Z87891 Personal history of nicotine dependence: Secondary | ICD-10-CM | POA: Diagnosis not present

## 2023-09-21 DIAGNOSIS — Z96651 Presence of right artificial knee joint: Secondary | ICD-10-CM | POA: Diagnosis not present

## 2023-09-21 DIAGNOSIS — I10 Essential (primary) hypertension: Secondary | ICD-10-CM | POA: Diagnosis not present

## 2023-09-21 DIAGNOSIS — Z7982 Long term (current) use of aspirin: Secondary | ICD-10-CM | POA: Diagnosis not present

## 2023-09-21 DIAGNOSIS — E785 Hyperlipidemia, unspecified: Secondary | ICD-10-CM | POA: Diagnosis not present

## 2023-09-21 DIAGNOSIS — M545 Low back pain, unspecified: Secondary | ICD-10-CM | POA: Diagnosis not present

## 2023-09-21 DIAGNOSIS — Z96652 Presence of left artificial knee joint: Secondary | ICD-10-CM | POA: Diagnosis not present

## 2023-09-21 DIAGNOSIS — Z79891 Long term (current) use of opiate analgesic: Secondary | ICD-10-CM | POA: Diagnosis not present

## 2023-09-21 DIAGNOSIS — N200 Calculus of kidney: Secondary | ICD-10-CM | POA: Diagnosis not present

## 2023-09-24 DIAGNOSIS — Z87891 Personal history of nicotine dependence: Secondary | ICD-10-CM | POA: Diagnosis not present

## 2023-09-24 DIAGNOSIS — I10 Essential (primary) hypertension: Secondary | ICD-10-CM | POA: Diagnosis not present

## 2023-09-24 DIAGNOSIS — Z96652 Presence of left artificial knee joint: Secondary | ICD-10-CM | POA: Diagnosis not present

## 2023-09-24 DIAGNOSIS — E785 Hyperlipidemia, unspecified: Secondary | ICD-10-CM | POA: Diagnosis not present

## 2023-09-24 DIAGNOSIS — Z7982 Long term (current) use of aspirin: Secondary | ICD-10-CM | POA: Diagnosis not present

## 2023-09-24 DIAGNOSIS — M25562 Pain in left knee: Secondary | ICD-10-CM | POA: Diagnosis not present

## 2023-09-24 DIAGNOSIS — M5136 Other intervertebral disc degeneration, lumbar region with discogenic back pain only: Secondary | ICD-10-CM | POA: Diagnosis not present

## 2023-09-24 DIAGNOSIS — M545 Low back pain, unspecified: Secondary | ICD-10-CM | POA: Diagnosis not present

## 2023-09-24 DIAGNOSIS — E1142 Type 2 diabetes mellitus with diabetic polyneuropathy: Secondary | ICD-10-CM | POA: Diagnosis not present

## 2023-09-24 DIAGNOSIS — N200 Calculus of kidney: Secondary | ICD-10-CM | POA: Diagnosis not present

## 2023-09-24 DIAGNOSIS — Z471 Aftercare following joint replacement surgery: Secondary | ICD-10-CM | POA: Diagnosis not present

## 2023-09-24 DIAGNOSIS — Z96651 Presence of right artificial knee joint: Secondary | ICD-10-CM | POA: Diagnosis not present

## 2023-09-24 DIAGNOSIS — Z79891 Long term (current) use of opiate analgesic: Secondary | ICD-10-CM | POA: Diagnosis not present

## 2023-09-28 DIAGNOSIS — Z7982 Long term (current) use of aspirin: Secondary | ICD-10-CM | POA: Diagnosis not present

## 2023-09-28 DIAGNOSIS — M545 Low back pain, unspecified: Secondary | ICD-10-CM | POA: Diagnosis not present

## 2023-09-28 DIAGNOSIS — M25562 Pain in left knee: Secondary | ICD-10-CM | POA: Diagnosis not present

## 2023-09-28 DIAGNOSIS — Z96651 Presence of right artificial knee joint: Secondary | ICD-10-CM | POA: Diagnosis not present

## 2023-09-28 DIAGNOSIS — M5136 Other intervertebral disc degeneration, lumbar region with discogenic back pain only: Secondary | ICD-10-CM | POA: Diagnosis not present

## 2023-09-28 DIAGNOSIS — Z96652 Presence of left artificial knee joint: Secondary | ICD-10-CM | POA: Diagnosis not present

## 2023-09-28 DIAGNOSIS — E1142 Type 2 diabetes mellitus with diabetic polyneuropathy: Secondary | ICD-10-CM | POA: Diagnosis not present

## 2023-09-28 DIAGNOSIS — N200 Calculus of kidney: Secondary | ICD-10-CM | POA: Diagnosis not present

## 2023-09-28 DIAGNOSIS — E785 Hyperlipidemia, unspecified: Secondary | ICD-10-CM | POA: Diagnosis not present

## 2023-09-28 DIAGNOSIS — Z87891 Personal history of nicotine dependence: Secondary | ICD-10-CM | POA: Diagnosis not present

## 2023-09-28 DIAGNOSIS — Z79891 Long term (current) use of opiate analgesic: Secondary | ICD-10-CM | POA: Diagnosis not present

## 2023-09-28 DIAGNOSIS — Z471 Aftercare following joint replacement surgery: Secondary | ICD-10-CM | POA: Diagnosis not present

## 2023-09-28 DIAGNOSIS — I10 Essential (primary) hypertension: Secondary | ICD-10-CM | POA: Diagnosis not present

## 2023-10-01 ENCOUNTER — Other Ambulatory Visit (HOSPITAL_COMMUNITY): Payer: Self-pay | Admitting: Psychiatry

## 2023-10-01 DIAGNOSIS — E785 Hyperlipidemia, unspecified: Secondary | ICD-10-CM | POA: Diagnosis not present

## 2023-10-01 DIAGNOSIS — Z471 Aftercare following joint replacement surgery: Secondary | ICD-10-CM | POA: Diagnosis not present

## 2023-10-01 DIAGNOSIS — Z79891 Long term (current) use of opiate analgesic: Secondary | ICD-10-CM | POA: Diagnosis not present

## 2023-10-01 DIAGNOSIS — E1142 Type 2 diabetes mellitus with diabetic polyneuropathy: Secondary | ICD-10-CM | POA: Diagnosis not present

## 2023-10-01 DIAGNOSIS — Z96651 Presence of right artificial knee joint: Secondary | ICD-10-CM | POA: Diagnosis not present

## 2023-10-01 DIAGNOSIS — M25562 Pain in left knee: Secondary | ICD-10-CM | POA: Diagnosis not present

## 2023-10-01 DIAGNOSIS — Z96652 Presence of left artificial knee joint: Secondary | ICD-10-CM | POA: Diagnosis not present

## 2023-10-01 DIAGNOSIS — I10 Essential (primary) hypertension: Secondary | ICD-10-CM | POA: Diagnosis not present

## 2023-10-01 DIAGNOSIS — Z7982 Long term (current) use of aspirin: Secondary | ICD-10-CM | POA: Diagnosis not present

## 2023-10-01 DIAGNOSIS — M5136 Other intervertebral disc degeneration, lumbar region with discogenic back pain only: Secondary | ICD-10-CM | POA: Diagnosis not present

## 2023-10-01 DIAGNOSIS — M545 Low back pain, unspecified: Secondary | ICD-10-CM | POA: Diagnosis not present

## 2023-10-01 DIAGNOSIS — Z87891 Personal history of nicotine dependence: Secondary | ICD-10-CM | POA: Diagnosis not present

## 2023-10-01 DIAGNOSIS — N200 Calculus of kidney: Secondary | ICD-10-CM | POA: Diagnosis not present

## 2023-10-03 DIAGNOSIS — M5136 Other intervertebral disc degeneration, lumbar region with discogenic back pain only: Secondary | ICD-10-CM | POA: Diagnosis not present

## 2023-10-03 DIAGNOSIS — E1142 Type 2 diabetes mellitus with diabetic polyneuropathy: Secondary | ICD-10-CM | POA: Diagnosis not present

## 2023-10-03 DIAGNOSIS — Z79891 Long term (current) use of opiate analgesic: Secondary | ICD-10-CM | POA: Diagnosis not present

## 2023-10-03 DIAGNOSIS — N200 Calculus of kidney: Secondary | ICD-10-CM | POA: Diagnosis not present

## 2023-10-03 DIAGNOSIS — Z96651 Presence of right artificial knee joint: Secondary | ICD-10-CM | POA: Diagnosis not present

## 2023-10-03 DIAGNOSIS — M545 Low back pain, unspecified: Secondary | ICD-10-CM | POA: Diagnosis not present

## 2023-10-03 DIAGNOSIS — Z7982 Long term (current) use of aspirin: Secondary | ICD-10-CM | POA: Diagnosis not present

## 2023-10-03 DIAGNOSIS — Z471 Aftercare following joint replacement surgery: Secondary | ICD-10-CM | POA: Diagnosis not present

## 2023-10-03 DIAGNOSIS — I10 Essential (primary) hypertension: Secondary | ICD-10-CM | POA: Diagnosis not present

## 2023-10-03 DIAGNOSIS — Z96652 Presence of left artificial knee joint: Secondary | ICD-10-CM | POA: Diagnosis not present

## 2023-10-03 DIAGNOSIS — M25562 Pain in left knee: Secondary | ICD-10-CM | POA: Diagnosis not present

## 2023-10-03 DIAGNOSIS — Z87891 Personal history of nicotine dependence: Secondary | ICD-10-CM | POA: Diagnosis not present

## 2023-10-03 DIAGNOSIS — E785 Hyperlipidemia, unspecified: Secondary | ICD-10-CM | POA: Diagnosis not present

## 2023-10-05 DIAGNOSIS — M7989 Other specified soft tissue disorders: Secondary | ICD-10-CM | POA: Diagnosis not present

## 2023-10-05 DIAGNOSIS — M79605 Pain in left leg: Secondary | ICD-10-CM | POA: Diagnosis not present

## 2023-10-05 DIAGNOSIS — E785 Hyperlipidemia, unspecified: Secondary | ICD-10-CM | POA: Diagnosis not present

## 2023-10-05 DIAGNOSIS — M5432 Sciatica, left side: Secondary | ICD-10-CM | POA: Diagnosis not present

## 2023-10-05 DIAGNOSIS — I2510 Atherosclerotic heart disease of native coronary artery without angina pectoris: Secondary | ICD-10-CM | POA: Diagnosis not present

## 2023-10-05 DIAGNOSIS — G90512 Complex regional pain syndrome I of left upper limb: Secondary | ICD-10-CM | POA: Diagnosis not present

## 2023-10-05 DIAGNOSIS — E1140 Type 2 diabetes mellitus with diabetic neuropathy, unspecified: Secondary | ICD-10-CM | POA: Diagnosis not present

## 2023-10-05 DIAGNOSIS — Z7982 Long term (current) use of aspirin: Secondary | ICD-10-CM | POA: Diagnosis not present

## 2023-10-05 DIAGNOSIS — I10 Essential (primary) hypertension: Secondary | ICD-10-CM | POA: Diagnosis not present

## 2023-10-05 DIAGNOSIS — R112 Nausea with vomiting, unspecified: Secondary | ICD-10-CM | POA: Diagnosis not present

## 2023-10-05 DIAGNOSIS — Z87891 Personal history of nicotine dependence: Secondary | ICD-10-CM | POA: Diagnosis not present

## 2023-10-05 DIAGNOSIS — J9811 Atelectasis: Secondary | ICD-10-CM | POA: Diagnosis not present

## 2023-10-05 DIAGNOSIS — Z96652 Presence of left artificial knee joint: Secondary | ICD-10-CM | POA: Diagnosis not present

## 2023-10-05 DIAGNOSIS — R42 Dizziness and giddiness: Secondary | ICD-10-CM | POA: Diagnosis not present

## 2023-10-10 DIAGNOSIS — E785 Hyperlipidemia, unspecified: Secondary | ICD-10-CM | POA: Diagnosis not present

## 2023-10-10 DIAGNOSIS — E1142 Type 2 diabetes mellitus with diabetic polyneuropathy: Secondary | ICD-10-CM | POA: Diagnosis not present

## 2023-10-10 DIAGNOSIS — Z79891 Long term (current) use of opiate analgesic: Secondary | ICD-10-CM | POA: Diagnosis not present

## 2023-10-10 DIAGNOSIS — M25562 Pain in left knee: Secondary | ICD-10-CM | POA: Diagnosis not present

## 2023-10-10 DIAGNOSIS — Z96651 Presence of right artificial knee joint: Secondary | ICD-10-CM | POA: Diagnosis not present

## 2023-10-10 DIAGNOSIS — M5136 Other intervertebral disc degeneration, lumbar region with discogenic back pain only: Secondary | ICD-10-CM | POA: Diagnosis not present

## 2023-10-10 DIAGNOSIS — Z7982 Long term (current) use of aspirin: Secondary | ICD-10-CM | POA: Diagnosis not present

## 2023-10-10 DIAGNOSIS — Z87891 Personal history of nicotine dependence: Secondary | ICD-10-CM | POA: Diagnosis not present

## 2023-10-10 DIAGNOSIS — M545 Low back pain, unspecified: Secondary | ICD-10-CM | POA: Diagnosis not present

## 2023-10-10 DIAGNOSIS — N200 Calculus of kidney: Secondary | ICD-10-CM | POA: Diagnosis not present

## 2023-10-10 DIAGNOSIS — Z471 Aftercare following joint replacement surgery: Secondary | ICD-10-CM | POA: Diagnosis not present

## 2023-10-10 DIAGNOSIS — I10 Essential (primary) hypertension: Secondary | ICD-10-CM | POA: Diagnosis not present

## 2023-10-10 DIAGNOSIS — Z96652 Presence of left artificial knee joint: Secondary | ICD-10-CM | POA: Diagnosis not present

## 2023-10-16 DIAGNOSIS — E1142 Type 2 diabetes mellitus with diabetic polyneuropathy: Secondary | ICD-10-CM | POA: Diagnosis not present

## 2023-10-16 DIAGNOSIS — R6889 Other general symptoms and signs: Secondary | ICD-10-CM | POA: Diagnosis not present

## 2023-10-16 DIAGNOSIS — M545 Low back pain, unspecified: Secondary | ICD-10-CM | POA: Diagnosis not present

## 2023-10-16 DIAGNOSIS — Z87891 Personal history of nicotine dependence: Secondary | ICD-10-CM | POA: Diagnosis not present

## 2023-10-16 DIAGNOSIS — E785 Hyperlipidemia, unspecified: Secondary | ICD-10-CM | POA: Diagnosis not present

## 2023-10-16 DIAGNOSIS — Z96651 Presence of right artificial knee joint: Secondary | ICD-10-CM | POA: Diagnosis not present

## 2023-10-16 DIAGNOSIS — Z79891 Long term (current) use of opiate analgesic: Secondary | ICD-10-CM | POA: Diagnosis not present

## 2023-10-16 DIAGNOSIS — M5136 Other intervertebral disc degeneration, lumbar region with discogenic back pain only: Secondary | ICD-10-CM | POA: Diagnosis not present

## 2023-10-16 DIAGNOSIS — I10 Essential (primary) hypertension: Secondary | ICD-10-CM | POA: Diagnosis not present

## 2023-10-16 DIAGNOSIS — Z7982 Long term (current) use of aspirin: Secondary | ICD-10-CM | POA: Diagnosis not present

## 2023-10-16 DIAGNOSIS — N200 Calculus of kidney: Secondary | ICD-10-CM | POA: Diagnosis not present

## 2023-10-16 DIAGNOSIS — M25562 Pain in left knee: Secondary | ICD-10-CM | POA: Diagnosis not present

## 2023-10-16 DIAGNOSIS — Z471 Aftercare following joint replacement surgery: Secondary | ICD-10-CM | POA: Diagnosis not present

## 2023-10-16 DIAGNOSIS — Z96652 Presence of left artificial knee joint: Secondary | ICD-10-CM | POA: Diagnosis not present

## 2023-10-25 DIAGNOSIS — M4316 Spondylolisthesis, lumbar region: Secondary | ICD-10-CM | POA: Diagnosis not present

## 2023-10-25 DIAGNOSIS — M47816 Spondylosis without myelopathy or radiculopathy, lumbar region: Secondary | ICD-10-CM | POA: Diagnosis not present

## 2023-10-25 DIAGNOSIS — M5416 Radiculopathy, lumbar region: Secondary | ICD-10-CM | POA: Diagnosis not present

## 2023-10-26 ENCOUNTER — Other Ambulatory Visit (HOSPITAL_COMMUNITY): Payer: Self-pay | Admitting: Psychiatry

## 2023-11-07 DIAGNOSIS — M5416 Radiculopathy, lumbar region: Secondary | ICD-10-CM | POA: Diagnosis not present

## 2023-11-21 LAB — HM DIABETES EYE EXAM

## 2023-12-03 ENCOUNTER — Encounter (HOSPITAL_COMMUNITY): Payer: Self-pay

## 2023-12-06 DIAGNOSIS — M5416 Radiculopathy, lumbar region: Secondary | ICD-10-CM | POA: Diagnosis not present

## 2023-12-06 DIAGNOSIS — M47816 Spondylosis without myelopathy or radiculopathy, lumbar region: Secondary | ICD-10-CM | POA: Diagnosis not present

## 2023-12-07 ENCOUNTER — Ambulatory Visit: Payer: Medicare Other | Admitting: Family Medicine

## 2023-12-26 ENCOUNTER — Ambulatory Visit (INDEPENDENT_AMBULATORY_CARE_PROVIDER_SITE_OTHER): Admitting: Family Medicine

## 2023-12-26 ENCOUNTER — Encounter: Payer: Self-pay | Admitting: Family Medicine

## 2023-12-26 VITALS — BP 150/79 | HR 70 | Ht 66.0 in | Wt 177.8 lb

## 2023-12-26 DIAGNOSIS — R030 Elevated blood-pressure reading, without diagnosis of hypertension: Secondary | ICD-10-CM | POA: Diagnosis not present

## 2023-12-26 DIAGNOSIS — E1129 Type 2 diabetes mellitus with other diabetic kidney complication: Secondary | ICD-10-CM

## 2023-12-26 DIAGNOSIS — R809 Proteinuria, unspecified: Secondary | ICD-10-CM

## 2023-12-26 DIAGNOSIS — H5581 Saccadic eye movements: Secondary | ICD-10-CM | POA: Diagnosis not present

## 2023-12-26 DIAGNOSIS — M25562 Pain in left knee: Secondary | ICD-10-CM | POA: Diagnosis not present

## 2023-12-26 DIAGNOSIS — F3341 Major depressive disorder, recurrent, in partial remission: Secondary | ICD-10-CM | POA: Diagnosis not present

## 2023-12-26 DIAGNOSIS — E119 Type 2 diabetes mellitus without complications: Secondary | ICD-10-CM

## 2023-12-26 DIAGNOSIS — E785 Hyperlipidemia, unspecified: Secondary | ICD-10-CM | POA: Diagnosis not present

## 2023-12-26 LAB — POCT GLYCOSYLATED HEMOGLOBIN (HGB A1C): Hemoglobin A1C: 7 % — AB (ref 4.0–5.6)

## 2023-12-26 LAB — POCT UA - MICROALBUMIN
Albumin/Creatinine Ratio, Urine, POC: >300
Creatinine, POC: 100 mg/dL
Microalbumin Ur, POC: 150 mg/L

## 2023-12-26 MED ORDER — ATORVASTATIN CALCIUM 40 MG PO TABS: 40.0000 mg | ORAL_TABLET | Freq: Every day | ORAL | 3 refills | Status: AC

## 2023-12-26 MED ORDER — LOSARTAN POTASSIUM 25 MG PO TABS: ORAL_TABLET | ORAL | 3 refills | Status: DC

## 2023-12-26 NOTE — Assessment & Plan Note (Signed)
 Unclear etiology at this point.  It is not really noticeable to him just others who are looking at him.  We discussed referral to neurology for further workup.  If for some reason they are booked further out we can consider further workup with brain MRI with and without contrast he is unable to do MRI because of metal in his eye.  I do wonder make sure that his blood pressure comes back down.

## 2023-12-26 NOTE — Assessment & Plan Note (Signed)
 Pressure elevated today which is a little unusual him for him he is on losartan 25 for microalbuminuria not really for blood pressure.  Wife and come back in 2 weeks for nurse visit to recheck

## 2023-12-26 NOTE — Progress Notes (Signed)
 Hi Nathan [name], your urine sample showed large quantities of protein that are being spilled into your urine this is concerning that your kidneys are feeling stressed.  Your blood pressure was also high today.  I want to have you come back in about a week to do some repeat blood work and urine sample and also to have your blood pressure rechecked by one of our nurses.  Sometimes the protein level also goes up because her sugars are not as well-controlled so that could be contributing as well but I do want to recheck it.

## 2023-12-26 NOTE — Assessment & Plan Note (Signed)
 Microalbumin sample was greater than 300 today which is higher than his previous values though his A1c is also higher today.  Add like to have him return for a little bit more workup in about 1 to 2 weeks and for repeat urine microalbumin he is already started making some changes to his diet.

## 2023-12-26 NOTE — Assessment & Plan Note (Signed)
 A1c is up today at 7.0.  He says he is committed to making some changes and getting back on track with his diet and weight loss and exercise.  I will give him 90 days to make those improvements and get his A1c back down into the mid sixes but if he is unable to do so then we will need to start medication.  Does take atorvastatin and losartan.

## 2023-12-26 NOTE — Progress Notes (Signed)
 Established Patient Office Visit  Subjective  Patient ID: Nathan [name], male    DOB: 20-Nov-1950  Age: 73 y.o. MRN: [id number]  Chief Complaint  Patient presents with   Medical Management of Chronic Issues    T2DM last A1C 6.9    HPI  F/U DM  -he reports that he really has not been great with his diet for really the last 6 months he has also gained a little bit of weight that he is not happy with.  He says in fact he checked his blood sugar last week and was very unhappy with the number that he saw and started making some changes already to improve his diet.  He also says that about a month ago he was visiting his daughter in [place] and she noticed that just as he was talking he would very quickly roll his eyes back just for a quick second.  He was not aware that it was actually happening but she noticed it several times while he was talking and asked him why he was doing it.  Again he did not realize he was even doing it so she filmed him to show him.  And since then other people have noted it.  He said he called his eye doctor Dr. Zachery Conch when he got back in town and they did a full eye exam just to make sure that everything was okay he saw it happen while they were talking as well everything checked out in regards to his eyes and vision.  He says that he has been having a few more headaches than usual but no other weakness or facial symptoms signs or symptoms.  He says occasionally he will feel like his vision is blurry just for second and he will blink and it goes away or feels better.  Did have some labs done in January that I am able to see in care everywhere including a CBC and CMP.  Will get updated urine microalbumin today.    ROS    Objective:     BP (!) 150/79 (BP Location: Left Arm, Patient Position: Sitting, Cuff Size: Large)   Pulse 70   Ht 5\' 6"  (1.676 m)   Wt 177 lb 12 oz (80.6 kg)   SpO2 96%   BMI 28.69 kg/m    Physical Exam Vitals and nursing note  reviewed.  Constitutional:      Appearance: Normal appearance.  HENT:     Head: Normocephalic and atraumatic.  Eyes:     Conjunctiva/sclera: Conjunctivae normal.  Cardiovascular:     Rate and Rhythm: Normal rate and regular rhythm.  Pulmonary:     Effort: Pulmonary effort is normal.     Breath sounds: Normal breath sounds.  Skin:    General: Skin is warm and dry.  Neurological:     General: No focal deficit present.     Mental Status: He is alert and oriented to person, place, and time.     Cranial Nerves: No cranial nerve deficit.  Psychiatric:        Mood and Affect: Mood normal.      Results for orders placed or performed in visit on 12/26/23  POCT HgB A1C  Result Value Ref Range   Hemoglobin A1C 7.0 (A) 4.0 - 5.6 %   HbA1c POC (<> result, manual entry)     HbA1c, POC (prediabetic range)     HbA1c, POC (controlled diabetic range)    POCT UA - Microalbumin  Result Value  Ref Range   Microalbumin Ur, POC 150 mg/L   Creatinine, POC 100 mg/dL   Albumin/Creatinine Ratio, Urine, POC >300       The 10-year ASCVD risk score (Arnett DK, et al., 2019) is: 47.7%    Assessment & Plan:   Problem List Items Addressed This Visit       Endocrine   Microalbuminuria due to type 2 diabetes mellitus (HCC)   Microalbumin sample was greater than 300 today which is higher than his previous values though his A1c is also higher today.  Add like to have him return for a little bit more workup in about 1 to 2 weeks and for repeat urine microalbumin he is already started making some changes to his diet.      Relevant Medications   losartan (COZAAR) 25 MG tablet   atorvastatin (LIPITOR) 40 MG tablet   Other Relevant Orders   Hepatitis C antibody   HIV Antibody (routine testing w rflx)   Urine Microscopic   Microalbumin / creatinine urine ratio   Protein electrophoresis, serum   ANA   C-reactive protein   Sedimentation rate   CBC with Differential/Platelet   Protein  Electrophoresis, Urine Rflx.   Protein, Urine, 24 hour   Diabetes mellitus, type II (HCC) - Primary   A1c is up today at 7.0.  He says he is committed to making some changes and getting back on track with his diet and weight loss and exercise.  I will give him 90 days to make those improvements and get his A1c back down into the mid sixes but if he is unable to do so then we will need to start medication.  Does take atorvastatin and losartan.      Relevant Medications   losartan (COZAAR) 25 MG tablet   atorvastatin (LIPITOR) 40 MG tablet   Other Relevant Orders   POCT HgB A1C (Completed)   POCT UA - Microalbumin (Completed)   Hepatitis C antibody   HIV Antibody (routine testing w rflx)   Urine Microscopic   Microalbumin / creatinine urine ratio   Protein electrophoresis, serum   ANA   C-reactive protein   Sedimentation rate   CBC with Differential/Platelet   Protein Electrophoresis, Urine Rflx.   Protein, Urine, 24 hour     Nervous and Auditory   Abnormal saccadic eye movement   Unclear etiology at this point.  It is not really noticeable to him just others who are looking at him.  We discussed referral to neurology for further workup.  If for some reason they are booked further out we can consider further workup with brain MRI with and without contrast he is unable to do MRI because of metal in his eye.  I do wonder make sure that his blood pressure comes back down.      Relevant Orders   Ambulatory referral to Neurology     Other   Major depressive disorder, recurrent (HCC)   Hyperlipidemia   Relevant Medications   losartan (COZAAR) 25 MG tablet   atorvastatin (LIPITOR) 40 MG tablet   Elevated BP without diagnosis of hypertension   Pressure elevated today which is a little unusual him for him he is on losartan 25 for microalbuminuria not really for blood pressure.  Wife and come back in 2 weeks for nurse visit to recheck      Other Visit Diagnoses       Urine test  positive for microalbuminuria       Relevant  Medications   losartan (COZAAR) 25 MG tablet   Other Relevant Orders   Hepatitis C antibody   HIV Antibody (routine testing w rflx)   Urine Microscopic   Microalbumin / creatinine urine ratio   Protein electrophoresis, serum   ANA   C-reactive protein   Sedimentation rate   CBC with Differential/Platelet   Protein Electrophoresis, Urine Rflx.   Protein, Urine, 24 hour       Will call to get copy of recent eye exam from Dr. Zachery Conch.  Return in about 3 months (around 03/26/2024) for Diabetes follow-up.    Nani Gasser, MD

## 2023-12-27 ENCOUNTER — Telehealth: Payer: Self-pay | Admitting: Family Medicine

## 2023-12-27 NOTE — Telephone Encounter (Signed)
 It is really supposed to be a nurse visit for blood pressure check in 2 weeks.

## 2023-12-27 NOTE — Telephone Encounter (Signed)
 Copied from CRM [id number]. Topic: Appointments - Appointment Scheduling >> Dec 27, 2023 10:47 AM Hamdi H wrote: Dr. Linford Arnold asked to see patient back in 1 week, but provider doesn't have any available appt slots before April 30th. I scheduled the patient with someone else on the team. If provider would like to him instead of a care team member then I told the patient that someone will call him back to reschedule. Patient will walk in for lab work before that appointment.  Does this patient need to see the nurse in 2 weeks for a BP check?

## 2023-12-28 NOTE — Telephone Encounter (Signed)
 Thank you I will call the patient.

## 2024-01-01 ENCOUNTER — Ambulatory Visit (HOSPITAL_COMMUNITY): Payer: Medicare Other | Admitting: Psychiatry

## 2024-01-01 ENCOUNTER — Encounter (HOSPITAL_COMMUNITY): Payer: Self-pay | Admitting: Psychiatry

## 2024-01-01 VITALS — BP 140/74 | HR 64 | Ht 66.0 in | Wt 173.0 lb

## 2024-01-01 DIAGNOSIS — F411 Generalized anxiety disorder: Secondary | ICD-10-CM | POA: Diagnosis not present

## 2024-01-01 DIAGNOSIS — F320 Major depressive disorder, single episode, mild: Secondary | ICD-10-CM | POA: Diagnosis not present

## 2024-01-01 DIAGNOSIS — F5104 Psychophysiologic insomnia: Secondary | ICD-10-CM | POA: Diagnosis not present

## 2024-01-01 MED ORDER — SERTRALINE HCL 50 MG PO TABS
50.0000 mg | ORAL_TABLET | Freq: Every day | ORAL | 1 refills | Status: DC
Start: 2024-01-01 — End: 2024-03-29

## 2024-01-01 MED ORDER — BUSPIRONE HCL 10 MG PO TABS: 10.0000 mg | ORAL_TABLET | Freq: Every day | ORAL | 1 refills | Status: DC

## 2024-01-01 MED ORDER — TRAZODONE HCL 50 MG PO TABS: 50.0000 mg | ORAL_TABLET | Freq: Every day | ORAL | 1 refills | Status: DC

## 2024-01-01 NOTE — Progress Notes (Signed)
 [facility] Follow up visit  Patient Identification: Nathan [name] MRN:  [id number] Date of Evaluation:  01/01/2024 Referral Source: primary care and himself Chief Complaint:   Chief Complaint  Patient presents with   Follow-up   Visit Diagnosis:    ICD-10-CM   1. Current mild episode of major depressive disorder, unspecified whether recurrent (HCC)  F32.0     2. GAD (generalized anxiety disorder)  F41.1     3. Psychophysiological insomnia  F51.04       History of Present Illness: Patient is a 73 years old currently married male returns for follow up  Last visit he re established care for depression and anxiety wanted to be off zoloft but then called later to get back as was feeling down ,somewhat anxious Now on zoloft 50mg  and have tapered off buspar to prn  No significant worry but keeps up with medical appointments, trying to walk more and working on weight maintenance   Aggravating factors; if he sees a collector,finances  Modifying factors; he watches television,retired    Duration adult life Severity ; depression is better    Past Psychiatric History: depression, anxiety   Previous Psychotropic Medications: Yes   Substance Abuse History in the last 12 months:  Yes.    Consequences of Substance Abuse: Few beers a week. Discussed effect to mood and depression  Past Medical History:  Past Medical History:  Diagnosis Date   Adenomatous colon polyp    Allergy    Anxiety    CRPS (complex regional pain syndrome), upper limb    Depression    Diabetes mellitus without complication (HCC)    Diverticulitis 2013   Diverticulosis    Diverticulosis    GERD (gastroesophageal reflux disease)    Glaucoma    History of Helicobacter pylori infection    Hyperlipidemia    Hypertension    no per pt   Intestinal metaplasia of gastric mucosa    Ligament tear of upper extremity April 2008   Left elbow   Nephrolithiasis    Renal calculi    Renal disorder     Past  Surgical History:  Procedure Laterality Date   LITHOTRIPSY  09/25/2002   kidney stones   REPLACEMENT TOTAL KNEE Right 09/25/2012   REPLACEMENT TOTAL KNEE Left 09/07/2023   ROTATOR CUFF REPAIR Left 09/25/2009   ULNAR NERVE REPAIR Left 2009, 2010,   3 surgeries    Family Psychiatric History: denies  Family History:  Family History  Problem Relation Age of Onset   Kidney disease Mother    Lung cancer Father 14   Colon cancer Neg Hx    Esophageal cancer Neg Hx    Rectal cancer Neg Hx    Stomach cancer Neg Hx    Colon polyps Neg Hx     Social History:   Social History   Socioeconomic History   Marital status: Married    Spouse name: Not on file   Number of children: 1   Years of education: 16   Highest education level: Bachelor's degree (e.g., BA, AB, BS)  Occupational History   Occupation: AIRCRAFT INT [employer]    Employer: [employer]    Comment: Retired.   Tobacco Use   Smoking status: Former    Current packs/day: 0.00    Average packs/day: 1 pack/day for 10.0 years (10.0 ttl pk-yrs)    Types: Cigarettes    Start date: 09/26/1971    Quit date: 09/25/1981    Years since quitting: 42.2   Smokeless tobacco:  Never  Vaping Use   Vaping status: Never Used  Substance and Sexual Activity   Alcohol use: Not Currently    Alcohol/week: 0.0 - 2.0 standard drinks of alcohol   Drug use: No   Sexual activity: Yes    Partners: Female  Other Topics Concern   Not on file  Social History Narrative   Some exercise. Caffeine daily. Plays chess to keep brain stimulated   Social Drivers of Health   Financial Resource Strain: Low Risk  (01/23/2023)   [facility]   Overall Financial Resource Strain (CARDIA)    Difficulty of Paying Living Expenses: Not very hard  Food Insecurity: No Food Insecurity (09/07/2023)   [facility] from [facility]   Hunger Vital Sign    Worried About Running Out of Food in the Last Year: Never true    Ran Out of Food in the Last  Year: Never true  Transportation Needs: No Transportation Needs (09/07/2023)   Received from [facility] - Transportation    Lack of Transportation (Medical): No    Lack of Transportation (Non-Medical): No  Physical Activity: Sufficiently Active (01/23/2023)   Received from [facility]   Exercise Vital Sign    Days of Exercise per Week: 3 days    Minutes of Exercise per Session: 60 min  Stress: No Stress Concern Present (09/07/2023)   Received from [facility] - Occupational Stress Questionnaire    Feeling of Stress : Only a little  Social Connections: Moderately Integrated (01/23/2023)   Received from [facility]   Social Network    How would you rate your social network (family, work, friends)?: Adequate participation with social networks    Additional Social History: married and retired, has one son  Allergies:   Allergies  Allergen Reactions   Latex Rash   Adhesive [Tape]     Hives on area wear tape applied   Hydrocodone-Acetaminophen Itching    Metabolic Disorder Labs: Lab Results  Component Value Date   HGBA1C 7.0 (A) 12/26/2023   MPG 134 11/22/2021   MPG 120 08/28/2019   No results found for: "PROLACTIN" Lab Results  Component Value Date   CHOL 156 08/09/2023   TRIG 260 (H) 08/09/2023   HDL 45 08/09/2023   CHOLHDL 4.4 02/22/2022   VLDL 45 (H) 06/14/2016   LDLCALC 69 08/09/2023   LDLCALC  02/22/2022     Comment:     . LDL cholesterol not calculated. Triglyceride levels greater than 400 mg/dL invalidate calculated LDL results. . Reference range: <100 . Desirable range <100 mg/dL for primary prevention;   <70 mg/dL for patients with CHD or diabetic patients  with > or = 2 CHD risk factors. [name] LDL-C is now calculated using the Martin-Hopkins  calculation, which is a validated novel method providing  better accuracy than the Friedewald equation in the  estimation of  LDL-C.  Horald Pollen et al. Lenox Ahr. 4098;119(14): 2061-2068  (http://education.[facility].com/faq/FAQ164)    Lab Results  Component Value Date   TSH 1.810 08/09/2023    Therapeutic Level Labs: No results found for: "LITHIUM" No results found for: "CBMZ" No results found for: "VALPROATE"  Current Medications: Current Outpatient Medications  Medication Sig Dispense Refill   atorvastatin (LIPITOR) 40 MG tablet Take 1 tablet (40 mg total) by mouth daily. 90 tablet 3   Blood Glucose Monitoring Suppl (ONE TOUCH ULTRA SYSTEM KIT) w/Device KIT For testing  blood sugars daily Dx: E11.9 1 each 0   busPIRone (BUSPAR) 10 MG tablet Take 1 tablet (10 mg total) by mouth daily. 30 tablet 1   glucose blood (ONE TOUCH ULTRA TEST) test strip USE 1 STRIP TO CHECK GLUCOSE ONCE DAILY 100 each 0   losartan (COZAAR) 25 MG tablet TAKE 1 TABLET BY MOUTH ONCE DAILY FOR  MICROALBUMINURIA 90 tablet 3   meloxicam (MOBIC) 15 MG tablet TAKE 1 TABLET BY MOUTH IN THE MORNING WITH MEALS FOR 2 WEEKS THEN ONCE DAILY AS NEEDED 30 tablet 0   Multiple Vitamins-Minerals (CENTRUM ADULTS PO) Take 1 tablet by mouth daily.     OneTouch Delica Lancets 30G MISC 1 strip by Other route in the morning. 100 each PRN   sertraline (ZOLOFT) 50 MG tablet Take 1 tablet (50 mg total) by mouth daily. 30 tablet 1   traZODone (DESYREL) 50 MG tablet Take 1 tablet (50 mg total) by mouth at bedtime. 30 tablet 1   No current facility-administered medications for this visit.     Psychiatric Specialty Exam: Review of Systems  Cardiovascular:  Negative for chest pain.  Musculoskeletal:  Positive for arthralgias.  Neurological:  Negative for tremors.  Psychiatric/Behavioral:  Negative for dysphoric mood.     Blood pressure (!) 140/74, pulse 64, height 5\' 6"  (1.676 m), weight 173 lb (78.5 kg).Body mass index is 27.92 kg/m.  General Appearance: Casual  Eye Contact:  Fair  Speech:  Normal Rate  Volume:  Decreased  Mood:  Euthymic  Affect:   Congruent  Thought Process:  Goal Directed  Orientation:  Full (Time, Place, and Person)  Thought Content:  Rumination  Suicidal Thoughts:  No  Homicidal Thoughts:  No  Memory:  Immediate;   Fair  Judgement:  Fair  Insight:  Fair  Psychomotor Activity:  Normal  Concentration:  Concentration: Fair  Recall:  Fair  Fund of Knowledge:Good  Language: Good  Akathisia:  No  Handed:    AIMS (if indicated):  not done  Assets:  Desire for Improvement Financial Resources/Insurance  ADL's:  Intact  Cognition: WNL  Sleep:  variable   Screenings: GAD-7    Flowsheet Row Office Visit from 09/04/2023 in [facility] at [facility] Office Visit from 09/05/2019 in [facility] at [facility] Office Visit from 03/18/2019 in [facility] Primary Care & [facility] at [facility]  Total GAD-7 Score 6 5 0      PHQ2-9    Flowsheet Row Office Visit from 09/04/2023 in [facility] at [facility] Office Visit from 01/17/2023 in [facility] Primary Care & Sports Medicine at [facility] Office Visit from 07/10/2022 in [facility] & [facility] at [facility] Office Visit from 06/26/2022 in [facility] Primary Care & Sports Medicine at [facility] Office Visit from 11/22/2021 in [facility] Primary Care & Sports Medicine at [facility]  PHQ-2 Total Score 1 0 0 1 0  PHQ-9 Total Score 2 -- -- 1 --      Flowsheet Row Office Visit from 09/04/2023 in [facility] at [facility] ED from 10/29/2021 in [facility] at [facility] Video Visit from 08/26/2021 in [facility] at [facility]  C-SSRS RISK CATEGORY No Risk No Risk No Risk       Assessment and Plan: as follows  Prior documentation reviewed  Major depressive disorder  recurrent as  of now in remission or mild  Doing better on low dose zoloft, will continue  Knee surgery went well and doing better and mobile more  Generalized anxiety disorder:  manageable continue buspar, and zoloft  Insomnia; doing better on trazadone, continue sleep hygiene  FU 3 m. Or earlier if needed Direct care time 20 minutes including chart review, documentation and face to face Collaboration of Care: Primary Care Provider AEB notes and chart reviewed  Patient/Guardian was advised Release of Information must be obtained prior to any record release in order to collaborate their care with an outside provider. Patient/Guardian was advised if they have not already done so to contact the registration department to sign all necessary forms in order for [place] to release information regarding their care.   Consent: Patient/Guardian gives verbal consent for treatment and assignment of benefits for services provided during this visit. Patient/Guardian expressed understanding and agreed to proceed.   Thresa Ross, MD 4/8/202510:29 AM

## 2024-01-02 ENCOUNTER — Ambulatory Visit: Admitting: Physician Assistant

## 2024-01-09 ENCOUNTER — Ambulatory Visit (INDEPENDENT_AMBULATORY_CARE_PROVIDER_SITE_OTHER): Admitting: Family Medicine

## 2024-01-09 VITALS — BP 131/70 | HR 69 | Ht 66.0 in | Wt 177.8 lb

## 2024-01-09 DIAGNOSIS — E1129 Type 2 diabetes mellitus with other diabetic kidney complication: Secondary | ICD-10-CM

## 2024-01-09 DIAGNOSIS — E119 Type 2 diabetes mellitus without complications: Secondary | ICD-10-CM

## 2024-01-09 DIAGNOSIS — R809 Proteinuria, unspecified: Secondary | ICD-10-CM

## 2024-01-09 NOTE — Progress Notes (Signed)
 Pt here today to BP checked. Denies headache, CP, medication changes. Last OV 150/79.                    Pt first reading 145/71. Pt sat for 10 mins second reading 131/70. Pt also did blood work and was given a 24hr urine kit per Dr. Greer Leak. Pt is to keep regular scheduled appointment. Per Jade breeback, PA-C BP is okay.

## 2024-01-10 DIAGNOSIS — E119 Type 2 diabetes mellitus without complications: Secondary | ICD-10-CM | POA: Diagnosis not present

## 2024-01-10 DIAGNOSIS — E1129 Type 2 diabetes mellitus with other diabetic kidney complication: Secondary | ICD-10-CM | POA: Diagnosis not present

## 2024-01-10 DIAGNOSIS — R809 Proteinuria, unspecified: Secondary | ICD-10-CM | POA: Diagnosis not present

## 2024-01-11 LAB — CBC WITH DIFFERENTIAL/PLATELET
Basophils Absolute: 0 10*3/uL (ref 0.0–0.2)
Basos: 0 %
EOS (ABSOLUTE): 0.2 10*3/uL (ref 0.0–0.4)
Eos: 2 %
Hematocrit: 49.1 % (ref 37.5–51.0)
Hemoglobin: 16.2 g/dL (ref 13.0–17.7)
Immature Grans (Abs): 0 10*3/uL (ref 0.0–0.1)
Immature Granulocytes: 1 %
Lymphocytes Absolute: 1.8 10*3/uL (ref 0.7–3.1)
Lymphs: 21 %
MCH: 29.2 pg (ref 26.6–33.0)
MCHC: 33 g/dL (ref 31.5–35.7)
MCV: 89 fL (ref 79–97)
Monocytes Absolute: 0.4 10*3/uL (ref 0.1–0.9)
Monocytes: 5 %
Neutrophils Absolute: 6 10*3/uL (ref 1.4–7.0)
Neutrophils: 71 %
Platelets: 213 10*3/uL (ref 150–450)
RBC: 5.55 x10E6/uL (ref 4.14–5.80)
RDW: 13.3 % (ref 11.6–15.4)
WBC: 8.4 10*3/uL (ref 3.4–10.8)

## 2024-01-11 LAB — HEPATITIS C ANTIBODY: Hep C Virus Ab: NONREACTIVE

## 2024-01-11 LAB — ANA: Anti Nuclear Antibody (ANA): NEGATIVE

## 2024-01-11 LAB — PROTEIN ELECTROPHORESIS, SERUM
A/G Ratio: 1.4 (ref 0.7–1.7)
Albumin ELP: 4.4 g/dL (ref 2.9–4.4)
Alpha 1: 0.2 g/dL (ref 0.0–0.4)
Alpha 2: 0.7 g/dL (ref 0.4–1.0)
Beta: 1.1 g/dL (ref 0.7–1.3)
Gamma Globulin: 1.2 g/dL (ref 0.4–1.8)
Globulin, Total: 3.2 g/dL (ref 2.2–3.9)
Total Protein: 7.6 g/dL (ref 6.0–8.5)

## 2024-01-11 LAB — C-REACTIVE PROTEIN: CRP: 1 mg/L (ref 0–10)

## 2024-01-11 LAB — HIV ANTIBODY (ROUTINE TESTING W REFLEX): HIV Screen 4th Generation wRfx: NONREACTIVE

## 2024-01-11 LAB — SEDIMENTATION RATE: Sed Rate: 13 mm/h (ref 0–30)

## 2024-01-14 NOTE — Progress Notes (Signed)
 Nathan [name], I think this was just supposed to be a urine microalbumin not a 24-hour.

## 2024-01-18 NOTE — Progress Notes (Signed)
 [place], part your labs are in but not all of them.  You are negative for hepatitis C and HIV.  No sign of multiple myeloma.  Negative for lupus.  Inflammatory markers are negative and look good.  So less likely to be autoimmune.  Blood count looks great.

## 2024-01-18 NOTE — Progress Notes (Signed)
 Okay sounds good.  I do call the lab and see why they canceled it says the total volume was not recorded.  But that is not something we would do here the lab would have to actually record the total volume so that just seems strange.

## 2024-01-23 LAB — PROTEIN, URINE, 24 HOUR
Protein, 24H Urine: 354 mg/(24.h) — ABNORMAL HIGH (ref 30–150)
Protein, Ur: 23.6 mg/dL

## 2024-01-23 LAB — SPECIMEN STATUS REPORT

## 2024-01-24 ENCOUNTER — Encounter: Payer: Self-pay | Admitting: Family Medicine

## 2024-01-24 ENCOUNTER — Other Ambulatory Visit: Payer: Self-pay | Admitting: Family Medicine

## 2024-01-24 DIAGNOSIS — E1129 Type 2 diabetes mellitus with other diabetic kidney complication: Secondary | ICD-10-CM

## 2024-01-24 DIAGNOSIS — R809 Proteinuria, unspecified: Secondary | ICD-10-CM

## 2024-01-24 MED ORDER — LOSARTAN POTASSIUM 50 MG PO TABS: 50.0000 mg | ORAL_TABLET | Freq: Every day | ORAL | 3 refills | Status: AC

## 2024-01-24 NOTE — Progress Notes (Signed)
 Hi Nathan [name], we finally got the results back on the urine test.  It did confirm that you are spilling excess protein into the urine.  You have always had a little bit but now its shifted to greater than 300 so I would like to increase your losartan  to 50 mg that is medication that helps with proteinuria from diabetes which is what this is.  So I had like to bump your dose from 25 mg to 50 mg I think I had just refilled it for you.  You can take 2 of what you currently have but I am sending a new an updated prescription to your pharmacy as well.

## 2024-01-25 NOTE — Progress Notes (Signed)
 The good news is that your actual kidney function looks great!!!! So they are working well but spilling the protein.  One of the most important thing is to get your A1C back down. That is was is causig some of this protein loss. SO we need to get your A1C back down to at least 6.5 or better. Avoid Ibuprophen and Aleve  also avoids extra stress on the kidneys

## 2024-03-26 ENCOUNTER — Encounter: Payer: Self-pay | Admitting: Family Medicine

## 2024-03-26 ENCOUNTER — Ambulatory Visit: Admitting: Family Medicine

## 2024-03-26 VITALS — BP 118/58 | HR 77 | Ht 66.0 in | Wt 167.5 lb

## 2024-03-26 DIAGNOSIS — K760 Fatty (change of) liver, not elsewhere classified: Secondary | ICD-10-CM | POA: Diagnosis not present

## 2024-03-26 DIAGNOSIS — E119 Type 2 diabetes mellitus without complications: Secondary | ICD-10-CM | POA: Diagnosis not present

## 2024-03-26 DIAGNOSIS — F3341 Major depressive disorder, recurrent, in partial remission: Secondary | ICD-10-CM | POA: Diagnosis not present

## 2024-03-26 DIAGNOSIS — R809 Proteinuria, unspecified: Secondary | ICD-10-CM | POA: Diagnosis not present

## 2024-03-26 DIAGNOSIS — E1129 Type 2 diabetes mellitus with other diabetic kidney complication: Secondary | ICD-10-CM | POA: Diagnosis not present

## 2024-03-26 LAB — POCT GLYCOSYLATED HEMOGLOBIN (HGB A1C): Hemoglobin A1C: 5.9 % — AB (ref 4.0–5.6)

## 2024-03-26 MED ORDER — LANCETS MISC. MISC: 3 refills | Status: AC

## 2024-03-26 MED ORDER — BLOOD GLUCOSE TEST VI STRP: ORAL_STRIP | 3 refills | Status: AC

## 2024-03-26 MED ORDER — BLOOD GLUCOSE MONITORING SUPPL DEVI: 0 refills | Status: AC

## 2024-03-26 MED ORDER — LANCET DEVICE MISC
1.0000 | Freq: Every day | 1 refills | Status: AC
Start: 2024-03-26 — End: 2024-04-25

## 2024-03-26 NOTE — Patient Instructions (Signed)
 You are doing absolutely fantastic with your blood glucose levels.  Okay to decrease glucose testing to 2-3 times a week at this point.  If levels are increasing then you can start checking more frequently again and trying to get things back under good control.  Otherwise I will see you back in 4 months

## 2024-03-26 NOTE — Assessment & Plan Note (Signed)
 Is doing okay on the increased dose of losartan  and his A1c is down to 5.9's would like to recheck the urine microalbumin today.

## 2024-03-26 NOTE — Assessment & Plan Note (Signed)
 A1c looks fantastic and he is actually lost about 10 pounds since he was last here which should also help reduce steatosis in the liver.

## 2024-03-26 NOTE — Assessment & Plan Note (Signed)
 Mood is well-controlled.  PHQ-9 score of 1 today.  Currently on sertraline , as prescribed by Dr. Geralene and buspirone .

## 2024-03-26 NOTE — Progress Notes (Signed)
 Established Patient Office Visit  Subjective  Patient ID: Nathan [name], male    DOB: 1951-02-03  Age: 73 y.o. MRN: [id number]  Chief Complaint  Patient presents with   Diabetes    Pt would like to get a new glucose meter   Hypertension    HPI  Diabetes - no hypoglycemic events. No wounds or sores that are not healing well. No increased thirst or urination. Checking glucose at home. Taking medications as prescribed without any side effects.  Does need refills on the lancets and strips for his machine.  Hypertension- Pt denies chest pain, SOB, dizziness, or heart palpitations.  Taking meds as directed w/o problems.  Denies medication side effects.    Flowsheet Row Office Visit from 03/26/2024 in [facility] Primary Care & Sports Medicine at [facility]  PHQ-9 Total Score 1       ROS    Objective:     BP (!) 118/58 (BP Location: Left Arm, Cuff Size: Normal)   Pulse 77   Ht 5' 6 (1.676 m)   Wt 167 lb 8 oz (76 kg)   SpO2 95%   BMI 27.04 kg/m    Physical Exam Vitals and nursing note reviewed.  Constitutional:      Appearance: Normal appearance.  HENT:     Head: Normocephalic and atraumatic.  Eyes:     Conjunctiva/sclera: Conjunctivae normal.  Cardiovascular:     Rate and Rhythm: Normal rate and regular rhythm.  Pulmonary:     Effort: Pulmonary effort is normal.     Breath sounds: Normal breath sounds.  Skin:    General: Skin is warm and dry.  Neurological:     Mental Status: He is alert.  Psychiatric:        Mood and Affect: Mood normal.      Results for orders placed or performed in visit on 03/26/24  POCT HgB A1C  Result Value Ref Range   Hemoglobin A1C 5.9 (A) 4.0 - 5.6 %   HbA1c POC (<> result, manual entry)     HbA1c, POC (prediabetic range)     HbA1c, POC (controlled diabetic range)        The 10-year ASCVD risk score (Arnett DK, et al., 2019) is: 34.4%    Assessment & Plan:   Problem List Items Addressed This Visit        Digestive   Fatty liver   A1c looks fantastic and he is actually lost about 10 pounds since he was last here which should also help reduce steatosis in the liver.        Endocrine   Microalbuminuria due to type 2 diabetes mellitus (HCC)   Is doing okay on the increased dose of losartan  and his A1c is down to 5.9's would like to recheck the urine microalbumin today.      Relevant Orders   Urine Microalbumin w/creat. ratio   Diabetes mellitus, type II (HCC) - Primary   A1C look s great at 5.9.  Today.  He is checking his glucose almost every other day.  He has really done a phenomenal job.  Home blood glucose levels running between 109-120.  He also wanted to follow-up on the urine microalbuminuria.      Relevant Medications   Glucose Blood (BLOOD GLUCOSE TEST STRIPS) STRP   Lancet Device MISC   Lancets Misc. MISC   Blood Glucose Monitoring Suppl DEVI   Other Relevant Orders   POCT HgB A1C (Completed)   Urine Microalbumin w/creat.  ratio     Other   Major depressive disorder, recurrent (HCC)   Mood is well-controlled.  PHQ-9 score of 1 today.  Currently on sertraline , as prescribed by Dr. Geralene and buspirone .      We can go ahead and get him scheduled for his Medicare wellness with our Medicare wellness nurse.  Return in about 4 months (around 07/27/2024) for Diabetes follow-up.    Dorothyann Byars, MD

## 2024-03-26 NOTE — Assessment & Plan Note (Addendum)
 A1C look s great at 5.9.  Today.  He is checking his glucose almost every other day.  He has really done a phenomenal job.  Home blood glucose levels running between 109-120.  He also wanted to follow-up on the urine microalbuminuria.

## 2024-03-27 ENCOUNTER — Other Ambulatory Visit (HOSPITAL_COMMUNITY): Payer: Self-pay | Admitting: Psychiatry

## 2024-03-27 LAB — MICROALBUMIN / CREATININE URINE RATIO
Creatinine, Urine: 84.2 mg/dL
Microalb/Creat Ratio: 79 mg/g{creat} — ABNORMAL HIGH (ref 0–29)
Microalbumin, Urine: 66.3 ug/mL

## 2024-03-27 LAB — SPECIMEN STATUS REPORT

## 2024-03-30 ENCOUNTER — Ambulatory Visit: Payer: Self-pay | Admitting: Family Medicine

## 2024-03-30 NOTE — Progress Notes (Signed)
 Hi Couper, still spilling a little excess protein in the urine similar to it over the last couple years but it is pretty stable no large amount being spilled just a small amount.  Will continue to monitor.  Will continue the losartan  this helps protect the kidneys

## 2024-04-03 ENCOUNTER — Ambulatory Visit (HOSPITAL_COMMUNITY): Admitting: Psychiatry

## 2024-04-08 ENCOUNTER — Ambulatory Visit (HOSPITAL_COMMUNITY): Admitting: Psychiatry

## 2024-04-08 ENCOUNTER — Encounter (HOSPITAL_COMMUNITY): Payer: Self-pay | Admitting: Psychiatry

## 2024-04-08 VITALS — BP 133/74 | HR 71 | Ht 68.0 in | Wt 168.0 lb

## 2024-04-08 DIAGNOSIS — F5104 Psychophysiologic insomnia: Secondary | ICD-10-CM | POA: Diagnosis not present

## 2024-04-08 DIAGNOSIS — F320 Major depressive disorder, single episode, mild: Secondary | ICD-10-CM | POA: Diagnosis not present

## 2024-04-08 DIAGNOSIS — F411 Generalized anxiety disorder: Secondary | ICD-10-CM

## 2024-04-08 MED ORDER — TRAZODONE HCL 50 MG PO TABS: 50.0000 mg | ORAL_TABLET | Freq: Every day | ORAL | 1 refills | Status: DC

## 2024-04-08 MED ORDER — BUSPIRONE HCL 10 MG PO TABS: 10.0000 mg | ORAL_TABLET | Freq: Every day | ORAL | 1 refills | Status: AC

## 2024-04-08 NOTE — Progress Notes (Signed)
 [facility] Follow up visit  Patient Identification: Nathan [name] MRN:  [id number] Date of Evaluation:  04/08/2024 Referral Source: primary care and himself Chief Complaint:   Chief Complaint  Patient presents with   Follow-up   Visit Diagnosis:    ICD-10-CM   1. Current mild episode of major depressive disorder, unspecified whether recurrent (HCC)  F32.0     2. GAD (generalized anxiety disorder)  F41.1     3. 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    Consequences of Substance Abuse: Few beers a week. Discussed effect to mood and depression  Past Medical History:  Past Medical History:  Diagnosis Date   Adenomatous colon polyp    Allergy     Anxiety    CRPS (complex regional pain syndrome), upper limb    Depression    Diabetes mellitus without complication (HCC)    Diverticulitis 2013   Diverticulosis    Diverticulosis    GERD (gastroesophageal reflux disease)    Glaucoma    History of Helicobacter pylori infection    Hyperlipidemia    Hypertension    no per pt   Intestinal metaplasia of gastric mucosa    Ligament tear of upper extremity April 2008   Left elbow   Nephrolithiasis    Renal calculi    Renal disorder     Past Surgical History:  Procedure Laterality Date   LITHOTRIPSY  09/25/2002   kidney  stones   REPLACEMENT TOTAL KNEE Right 09/25/2012   REPLACEMENT TOTAL KNEE Left 09/07/2023   ROTATOR CUFF REPAIR Left 09/25/2009   ULNAR NERVE REPAIR Left 2009, 2010,   3 surgeries    Family Psychiatric History: denies  Family History:  Family History  Problem Relation Age of Onset   Kidney disease Mother    Lung cancer Father 59   Colon cancer Neg Hx    Esophageal cancer Neg Hx    Rectal cancer Neg Hx    Stomach cancer Neg Hx    Colon polyps Neg Hx     Social History:   Social History   Socioeconomic History   Marital status: Married    Spouse name: Not on file   Number of children: 1   Years of education: 16   Highest education level: Bachelor's degree (e.g., BA, AB, BS)  Occupational History   Occupation: AIRCRAFT INT [employer]    Employer: [employer]    Comment: Retired.   Tobacco Use   Smoking status: Former    Current packs/day: 0.00    Average packs/day: 1 pack/day for 10.0 years (10.0 ttl pk-yrs)    Types: Cigarettes    Start date: 09/26/1971    Quit date: 09/25/1981    Years since quitting: 42.5   Smokeless tobacco: Never  Vaping Use   Vaping status: Never Used  Substance and  Sexual Activity   Alcohol use: Not Currently    Alcohol/week: 0.0 - 2.0 standard drinks of alcohol   Drug use: No   Sexual activity: Yes    Partners: Female  Other Topics Concern   Not on file  Social History Narrative   Some exercise. Caffeine daily. Plays chess to keep brain stimulated   Social Drivers of Health   Financial Resource Strain: Low Risk  (01/23/2023)   Received from [facility]   Overall Financial Resource Strain (CARDIA)    Difficulty of Paying Living Expenses: Not very hard  Food Insecurity: No Food Insecurity (09/07/2023)   Received from [facility]   Hunger Vital Sign    Within the past 12 months, you worried that your food would run out before you got the money to buy more.: Never true    Within the past 12 months, the food you bought just didn't last and you  didn't have money to get more.: Never true  Transportation Needs: No Transportation Needs (09/07/2023)   Received from [facility] - Transportation    Lack of Transportation (Medical): No    Lack of Transportation (Non-Medical): No  Physical Activity: Sufficiently Active (01/23/2023)   Received from [facility]   Exercise Vital Sign    On average, how many days per week do you engage in moderate to strenuous exercise (like a brisk walk)?: 3 days    On average, how many minutes do you engage in exercise at this level?: 60 min  Stress: No Stress Concern Present (09/07/2023)   Received from [facility] - Occupational Stress Questionnaire    Feeling of Stress : Only a little  Social Connections: Moderately Integrated (01/23/2023)   Received from [facility]   Social Network    How would you rate your social network (family, work, friends)?: Adequate participation with social networks    Additional Social History: married and retired, has one son  Allergies:   Allergies  Allergen Reactions   Latex Rash   Adhesive [Tape]     Hives on area wear tape applied   Hydrocodone -Acetaminophen  Itching    Metabolic Disorder Labs: Lab Results  Component Value Date   HGBA1C 5.9 (A) 03/26/2024   MPG 134 11/22/2021   MPG 120 08/28/2019   No results found for: PROLACTIN Lab Results  Component Value Date   CHOL 156 08/09/2023   TRIG 260 (H) 08/09/2023   HDL 45 08/09/2023   CHOLHDL 4.4 02/22/2022   VLDL 45 (H) 06/14/2016   LDLCALC 69 08/09/2023   LDLCALC  02/22/2022     Comment:     . LDL cholesterol not calculated. Triglyceride levels greater than 400 mg/dL invalidate calculated LDL results. . Reference range: <100 . Desirable range <100 mg/dL for primary prevention;   <70 mg/dL for patients with CHD or diabetic patients  with > or = 2 CHD risk factors. SABRA LDL-C is now calculated using the Martin-Hopkins  calculation,  which is a validated novel method providing  better accuracy than the Friedewald equation in the  estimation of LDL-C.  Gladis APPLETHWAITE et al. SANDREA. 7986;689(80): 2061-2068  (http://education.[facility].com/faq/FAQ164)    Lab Results  Component Value Date   TSH 1.810 08/09/2023    Therapeutic Level Labs: No results found for: LITHIUM No results found for: CBMZ No results found for: VALPROATE  Current Medications: Current Outpatient Medications  Medication Sig Dispense Refill   atorvastatin  (LIPITOR) 40 MG tablet Take  1 tablet (40 mg total) by mouth daily. 90 tablet 3   Blood Glucose Monitoring Suppl DEVI May substitute to any manufacturer covered by patient's insurance.testing blood sugars daily. E11.9 1 each 0   busPIRone  (BUSPAR ) 10 MG tablet Take 1 tablet (10 mg total) by mouth daily. 30 tablet 1   Glucose Blood (BLOOD GLUCOSE TEST STRIPS) STRP May substitute to any manufacturer covered by patient's insurance.for testing blood sugars daily. E11.9 100 strip 3   Lancet Device MISC 1 each by Does not apply route daily. May substitute to any manufacturer covered by patient's insurance.testing blood sugars daily. E11.9 1 each 1   Lancets Misc. MISC May substitute to any manufacturer covered by patient's insurance.testing blood sugars daily. E11.9 100 each 3   losartan  (COZAAR ) 50 MG tablet Take 1 tablet (50 mg total) by mouth daily. TAKE 1 TABLET BY MOUTH ONCE DAILY FOR  MICROALBUMINURIA 90 tablet 3   meloxicam  (MOBIC ) 15 MG tablet TAKE 1 TABLET BY MOUTH IN THE MORNING WITH MEALS FOR 2 WEEKS THEN ONCE DAILY AS NEEDED 30 tablet 0   Multiple Vitamins-Minerals (CENTRUM ADULTS PO) Take 1 tablet by mouth daily.     sertraline  (ZOLOFT ) 50 MG tablet Take 1 tablet by mouth once daily 30 tablet 0   traZODone  (DESYREL ) 50 MG tablet Take 1 tablet (50 mg total) by mouth at bedtime. 30 tablet 1   No current facility-administered medications for this visit.     Psychiatric Specialty  Exam: Review of Systems  Cardiovascular:  Negative for chest pain.  Neurological:  Negative for tremors.  Psychiatric/Behavioral:  Negative for dysphoric mood.     Blood pressure 133/74, pulse 71, height 5' 8 (1.727 m), weight 168 lb (76.2 kg).Body mass index is 25.54 kg/m.  General Appearance: Casual  Eye Contact:  Fair  Speech:  Normal Rate  Volume:  Decreased  Mood:  Euthymic  Affect:  Congruent  Thought Process:  Goal Directed  Orientation:  Full (Time, Place, and Person)  Thought Content:  Rumination  Suicidal Thoughts:  No  Homicidal Thoughts:  No  Memory:  Immediate;   Fair  Judgement:  Fair  Insight:  Fair  Psychomotor Activity:  Normal  Concentration:  Concentration: Fair  Recall:  Fair  Fund of Knowledge:Good  Language: Good  Akathisia:  No  Handed:    AIMS (if indicated):  not done  Assets:  Desire for Improvement Financial Resources/Insurance  ADL's:  Intact  Cognition: WNL  Sleep:  variable   Screenings: GAD-7    Flowsheet Row Office Visit from 03/26/2024 in [facility] Primary Care & Sports Medicine at [facility] Office Visit from 09/04/2023 in [facility] Outpatient Behavioral [facility] at [facility] Office Visit from 09/05/2019 in [facility] Primary Care & Sports Medicine at [facility] Office Visit from 03/18/2019 in [facility] at [facility]  Total GAD-7 Score 5 6 5  0   PHQ2-9    Flowsheet Row Office Visit from 03/26/2024 in [facility] Sports Medicine at [facility] Office Visit from 09/04/2023 in [facility] at [facility] Office Visit from 01/17/2023 in [facility] Primary Care & Sports Medicine at [facility] Office Visit from 07/10/2022 in [facility] Primary Care & Sports Medicine at [facility] Office Visit from 06/26/2022 in [facility] Primary Care & Sports Medicine at [facility] Total Score 0 1 0 0 1  PHQ-9 Total Score 1 2 -- --  1   Flowsheet Row Office Visit from 09/04/2023 in [facility]al [facility] at [facility] UC from 10/29/2021 in [facility] Urgent Care at [facility] Video Visit from 08/26/2021 in [facility] Outpatient Behavioral Health at [facility]  C-SSRS RISK CATEGORY No Risk No Risk No Risk    Assessment and Plan: as follows  Prior documentation reviewed  Major depressive disorder recurrent stable and in remission    Generalized anxiety disorder: Manageable with BuSpar  and sertraline  refill sent continue Insomnia; doing better on trazadone, continue sleep hygiene  FU 6 m. Or earlier if needed Direct care time 15 minutes including chart review, documentation and face to face Collaboration of Care: Primary Care Provider AEB notes and chart reviewed  Patient/Guardian was advised Release of Information must be obtained prior to any record release in order to collaborate their care with an outside provider. Patient/Guardian was advised if they have not already done so to contact the registration department to sign all necessary forms in order for us  to release information regarding their care.   Consent: Patient/Guardian gives verbal consent for treatment and assignment of benefits for services provided during this visit. Patient/Guardian expressed understanding and agreed to proceed.   Jackey Flight, MD 7/15/20253:23 PM

## 2024-04-10 ENCOUNTER — Ambulatory Visit

## 2024-04-10 VITALS — Ht 66.0 in | Wt 169.0 lb

## 2024-04-10 DIAGNOSIS — Z0000 Encounter for general adult medical examination without abnormal findings: Secondary | ICD-10-CM

## 2024-04-10 NOTE — Patient Instructions (Signed)
  Nathan [name] , Thank you for taking time to come for your Medicare Wellness Visit. I appreciate your ongoing commitment to your health goals. Please review the following plan we discussed and let me know if I can assist you in the future.   These are the goals we discussed:  Goals      Exercise 150 min/wk Moderate Activity     Increase exercise as tolerated     Patient Stated     Patient states he would like to continue healthy lifestyle.         This is a list of the screening recommended for you and due dates:  Health Maintenance  Topic Date Due   Zoster (Shingles) Vaccine (1 of 2) Never done   Hepatitis B Vaccine (2 of 3 - 19+ 3-dose series) 09/27/2015   COVID-19 Vaccine (3 - Moderna risk series) 08/24/2024*   Flu Shot  04/25/2024   Yearly kidney function blood test for diabetes  08/08/2024   Complete foot exam   08/08/2024   Hemoglobin A1C  09/26/2024   Eye exam for diabetics  11/20/2024   Yearly kidney health urinalysis for diabetes  03/26/2025   Medicare Annual Wellness Visit  04/10/2025   Colon Cancer Screening  03/25/2027   DTaP/Tdap/Td vaccine (3 - Td or Tdap) 07/24/2028   Pneumococcal Vaccine for age over 41  Completed   Hepatitis C Screening  Completed   HPV Vaccine  Aged Out   Meningitis B Vaccine  Aged Out  *Topic was postponed. The date shown is not the original due date.

## 2024-04-10 NOTE — Progress Notes (Signed)
 Subjective:   Nathan [name] is a 73 y.o. male who presents for Medicare Annual/Subsequent preventive examination.  Visit Complete: Virtual I connected with  [facility] on 04/10/24 by a audio enabled telemedicine application and verified that I am speaking with the correct person using two identifiers.  Patient Location: Home  Provider Location: Office/Clinic  I discussed the limitations of evaluation and management by telemedicine. The patient expressed understanding and agreed to proceed.  Vital Signs: Because this visit was a virtual/telehealth visit, some criteria may be missing or patient reported. Any vitals not documented were not able to be obtained and vitals that have been documented are patient reported.  Patient Medicare AWV questionnaire was completed by the patient on n/a; I have confirmed that all information answered by patient is correct and no changes since this date.  Cardiac Risk Factors include: advanced age (>65men, >66 women);dyslipidemia;diabetes mellitus;hypertension;male gender     Objective:    Today's Vitals   04/10/24 1555  Weight: 169 lb (76.7 kg)  Height: 5' 6 (1.676 m)   Body mass index is 27.28 kg/m.     04/10/2024    4:07 PM 04/08/2024    3:16 PM 09/04/2023   10:51 AM 10/31/2022    8:02 AM 07/24/2018    9:10 AM 01/18/2015   10:17 AM 05/27/2014    9:21 AM  Advanced Directives  Does Patient Have a Medical Advance Directive? No   No No  No  No   Would patient like information on creating a medical advance directive? No - Patient declined   No - Patient declined No - Patient declined   No - patient declined information      Information is confidential and restricted. Go to Review Flowsheets to unlock data.   Data saved with a previous flowsheet row definition    Current Medications (verified) Outpatient Encounter Medications as of 04/10/2024  Medication Sig   atorvastatin  (LIPITOR) 40 MG tablet Take 1 tablet (40 mg total) by mouth  daily.   Blood Glucose Monitoring Suppl DEVI May substitute to any manufacturer covered by patient's insurance.testing blood sugars daily. E11.9   busPIRone  (BUSPAR ) 10 MG tablet Take 1 tablet (10 mg total) by mouth daily.   Glucose Blood (BLOOD GLUCOSE TEST STRIPS) STRP May substitute to any manufacturer covered by patient's insurance.for testing blood sugars daily. E11.9   Lancet Device MISC 1 each by Does not apply route daily. May substitute to any manufacturer covered by patient's insurance.testing blood sugars daily. E11.9   Lancets Misc. MISC May substitute to any manufacturer covered by patient's insurance.testing blood sugars daily. E11.9   losartan  (COZAAR ) 50 MG tablet Take 1 tablet (50 mg total) by mouth daily. TAKE 1 TABLET BY MOUTH ONCE DAILY FOR  MICROALBUMINURIA   meloxicam  (MOBIC ) 15 MG tablet TAKE 1 TABLET BY MOUTH IN THE MORNING WITH MEALS FOR 2 WEEKS THEN ONCE DAILY AS NEEDED   Multiple Vitamins-Minerals (CENTRUM ADULTS PO) Take 1 tablet by mouth daily.   sertraline  (ZOLOFT ) 50 MG tablet Take 1 tablet by mouth once daily   traZODone  (DESYREL ) 50 MG tablet Take 1 tablet (50 mg total) by mouth at bedtime.   No facility-administered encounter medications on file as of 04/10/2024.    Allergies (verified) Latex, Adhesive [tape], and Hydrocodone -acetaminophen    History: Past Medical History:  Diagnosis Date   Adenomatous colon polyp    Allergy     Anxiety    CRPS (complex regional pain syndrome), upper limb    Depression  Diabetes mellitus without complication (HCC)    Diverticulitis 2013   Diverticulosis    Diverticulosis    GERD (gastroesophageal reflux disease)    Glaucoma    History of Helicobacter pylori infection    Hyperlipidemia    Hypertension    no per pt   Intestinal metaplasia of gastric mucosa    Ligament tear of upper extremity April 2008   Left elbow   Nephrolithiasis    Renal calculi    Renal disorder    Past Surgical History:  Procedure  Laterality Date   LITHOTRIPSY  09/25/2002   kidney stones   REPLACEMENT TOTAL KNEE Right 09/25/2012   REPLACEMENT TOTAL KNEE Left 09/07/2023   ROTATOR CUFF REPAIR Left 09/25/2009   ULNAR NERVE REPAIR Left 2009, 2010,   3 surgeries   Family History  Problem Relation Age of Onset   Kidney disease Mother    Lung cancer Father 41   Colon cancer Neg Hx    Esophageal cancer Neg Hx    Rectal cancer Neg Hx    Stomach cancer Neg Hx    Colon polyps Neg Hx    Social History   Socioeconomic History   Marital status: Married    Spouse name: Not on file   Number of children: 1   Years of education: 16   Highest education level: Bachelor's degree (e.g., BA, AB, BS)  Occupational History   Occupation: AIRCRAFT INT [employer]    Employer: [employer]    Comment: Retired.   Tobacco Use   Smoking status: Former    Current packs/day: 0.00    Average packs/day: 1 pack/day for 10.0 years (10.0 ttl pk-yrs)    Types: Cigarettes    Start date: 09/26/1971    Quit date: 09/25/1981    Years since quitting: 42.5   Smokeless tobacco: Never  Vaping Use   Vaping status: Never Used  Substance and Sexual Activity   Alcohol use: Not Currently    Alcohol/week: 0.0 - 2.0 standard drinks of alcohol   Drug use: No   Sexual activity: Yes    Partners: Female  Other Topics Concern   Not on file  Social History Narrative   Some exercise. Caffeine daily. Plays chess to keep brain stimulated   Social Drivers of Health   Financial Resource Strain: Low Risk  (04/10/2024)   Overall Financial Resource Strain (CARDIA)    Difficulty of Paying Living Expenses: Not hard at all  Food Insecurity: No Food Insecurity (04/10/2024)   Hunger Vital Sign    Worried About Running Out of Food in the Last Year: Never true    Ran Out of Food in the Last Year: Never true  Transportation Needs: No Transportation Needs (04/10/2024)   PRAPARE - Administrator, Civil Service (Medical): No    Lack of Transportation  (Non-Medical): No  Physical Activity: Sufficiently Active (04/10/2024)   Exercise Vital Sign    Days of Exercise per Week: 6 days    Minutes of Exercise per Session: 60 min  Stress: No Stress Concern Present (04/10/2024)   [employer] of Occupational Health - Occupational Stress Questionnaire    Feeling of Stress: Only a little  Social Connections: Moderately Integrated (04/10/2024)   Social Connection and Isolation Panel    Frequency of Communication with Friends and Family: More than three times a week    Frequency of Social Gatherings with Friends and Family: Once a week    Attends Religious Services: More than 4 times per  year    Active Member of Clubs or Organizations: No    Attends Banker Meetings: Never    Marital Status: Married    Tobacco Counseling Counseling given: Not Answered   Clinical Intake:  Pre-visit preparation completed: Yes  Pain : No/denies pain     BMI - recorded: 27.28 Nutritional Status: BMI 25 -29 Overweight Nutritional Risks: None Diabetes: No  How often do you need to have someone help you when you read instructions, pamphlets, or other written materials from your doctor or pharmacy?: 1 - Never What is the last grade level you completed in school?: 16  Interpreter Needed?: No      Activities of Daily Living    04/10/2024    4:00 PM  In your present state of health, do you have any difficulty performing the following activities:  Hearing? 0  Vision? 0  Difficulty concentrating or making decisions? 0  Walking or climbing stairs? 0  Dressing or bathing? 0  Doing errands, shopping? 0  Preparing Food and eating ? N  Using the Toilet? N  In the past six months, have you accidently leaked urine? N  Do you have problems with loss of bowel control? N  Managing your Medications? N  Managing your Finances? N  Housekeeping or managing your Housekeeping? N    Patient Care Team: Alvan Dorothyann BIRCH, MD as PCP -  Diedre Oneita Tresea VEAR, OD as Referring Physician (Optometry) Geralene Kaiser, MD as Consulting Physician (Psychiatry)  Indicate any recent Medical Services you may have received from other than [facility] providers in the past year (date may be approximate).     Assessment:   This is a routine wellness examination for [place].  Hearing/Vision screen No results found.   Goals Addressed             This Visit's Progress    Patient Stated       Patient states he would like to continue healthy lifestyle.        Depression Screen    04/10/2024    4:06 PM 03/26/2024    9:32 AM 09/04/2023   10:57 AM 01/17/2023   11:56 AM 07/10/2022   11:31 AM 06/26/2022   11:21 AM 02/22/2022   11:12 AM  PHQ 2/9 Scores  PHQ - 2 Score 0 0  0 0 1   PHQ- 9 Score  1    1   Exception Documentation       Other- indicate reason in comment box  Not completed       pt follows with psychiatry     Information is confidential and restricted. Go to Review Flowsheets to unlock data.    Fall Risk    04/10/2024    4:08 PM 03/26/2024    9:32 AM 01/17/2023   11:56 AM 07/10/2022   11:32 AM 06/26/2022   11:21 AM  Fall Risk   Falls in the past year? 0 0 0 0 0  Number falls in past yr: 0 0 0 0 0  Injury with Fall? 0 0 0 0 0  Risk for fall due to : No Fall Risks No Fall Risks No Fall Risks  No Fall Risks  Follow up Falls evaluation completed Falls evaluation completed Falls evaluation completed  Falls evaluation completed      Data saved with a previous flowsheet row definition    MEDICARE RISK AT HOME: Medicare Risk at Home Any stairs in or around the home?: Yes If so, are  there any without handrails?: Yes Home free of loose throw rugs in walkways, pet beds, electrical cords, etc?: Yes Adequate lighting in your home to reduce risk of falls?: Yes Life alert?: No Use of a cane, walker or w/c?: No Grab bars in the bathroom?: No Shower chair or bench in shower?: No Elevated toilet seat or a handicapped  toilet?: No  TIMED UP AND GO:  Was the test performed?  No    Cognitive Function:        04/10/2024    4:09 PM 07/24/2018    9:15 AM  6CIT Screen  What Year? 0 points 0 points  What month? 0 points 0 points  What time? 0 points 0 points  Count back from 20 0 points 0 points  Months in reverse 0 points 0 points  Repeat phrase 0 points 0 points  Total Score 0 points 0 points    Immunizations Immunization History  Administered Date(s) Administered   Hep A / Hep B 07/30/2015, 08/09/2015, 08/30/2015   Influenza Whole 06/25/2009   Moderna Sars-Covid-2 Vaccination 01/26/2020, 02/23/2020   Pneumococcal Conjugate-13 12/07/2016   Pneumococcal Polysaccharide-23 09/05/2019   Tdap 06/17/2007, 07/24/2018    TDAP status: Up to date  Flu Vaccine status: Declined, Education has been provided regarding the importance of this vaccine but patient still declined. Advised may receive this vaccine at local pharmacy or Health Dept. Aware to provide a copy of the vaccination record if obtained from local pharmacy or Health Dept. Verbalized acceptance and understanding.  Pneumococcal vaccine status: Up to date  Covid-19 vaccine status: Completed vaccines  Qualifies for Shingles Vaccine? Yes   Zostavax completed No   Shingrix Completed?: No.    Education has been provided regarding the importance of this vaccine. Patient has been advised to call insurance company to determine out of pocket expense if they have not yet received this vaccine. Advised may also receive vaccine at local pharmacy or Health Dept. Verbalized acceptance and understanding.  Screening Tests Health Maintenance  Topic Date Due   Zoster Vaccines- Shingrix (1 of 2) Never done   Hepatitis B Vaccines (2 of 3 - 19+ 3-dose series) 09/27/2015   COVID-19 Vaccine (3 - Moderna risk series) 08/24/2024 (Originally 03/22/2020)   INFLUENZA VACCINE  04/25/2024   Diabetic kidney evaluation - eGFR measurement  08/08/2024   FOOT EXAM   08/08/2024   HEMOGLOBIN A1C  09/26/2024   OPHTHALMOLOGY EXAM  11/20/2024   Diabetic kidney evaluation - Urine ACR  03/26/2025   Medicare Annual Wellness (AWV)  04/10/2025   Colonoscopy  03/25/2027   DTaP/Tdap/Td (3 - Td or Tdap) 07/24/2028   Pneumococcal Vaccine: 50+ Years  Completed   Hepatitis C Screening  Completed   HPV VACCINES  Aged Out   Meningococcal B Vaccine  Aged Out    Health Maintenance  Health Maintenance Due  Topic Date Due   Zoster Vaccines- Shingrix (1 of 2) Never done   Hepatitis B Vaccines (2 of 3 - 19+ 3-dose series) 09/27/2015    Colorectal cancer screening: Type of screening: Colonoscopy. Completed 03/24/2022. Repeat every 5 years  Lung Cancer Screening: (Low Dose CT Chest recommended if Age 29-80 years, 20 pack-year currently smoking OR have quit w/in 15years.) does not qualify.   Lung Cancer Screening Referral: n/a  Additional Screening:  Hepatitis C Screening: does qualify; Completed 01/09/2024  Vision Screening: Recommended annual ophthalmology exams for early detection of glaucoma and other disorders of the eye. Is the patient up to date with  their annual eye exam?  Yes  Who is the provider or what is the name of the office in which the patient attends annual eye exams? Dr Oneita If pt is not established with a provider, would they like to be referred to a provider to establish care? N/a.   Dental Screening: Recommended annual dental exams for proper oral hygiene   Community Resource Referral / Chronic Care Management: CRR required this visit?  No   CCM required this visit?  No     Plan:     I have personally reviewed and noted the following in the patient's chart:   Medical and social history Use of alcohol, tobacco or illicit drugs  Current medications and supplements including opioid prescriptions. Patient is not currently taking opioid prescriptions. Functional ability and status Nutritional status Physical activity Advanced  directives List of other physicians Hospitalizations # 1, surgeries # 1, and ER # 2 visits in previous 12 months Vitals Screenings to include cognitive, depression, and falls Referrals and appointments  In addition, I have reviewed and discussed with patient certain preventive protocols, quality metrics, and best practice recommendations. A written personalized care plan for preventive services as well as general preventive health recommendations were provided to patient.     Bonny Jon Mayor, CMA   04/10/2024   After Visit Summary: (MyChart) Due to this being a telephonic visit, the after visit summary with patients personalized plan was offered to patient via MyChart   Nurse Notes:   Nathan [name] is a 73 y.o. male patient of Dorothyann Byars, MD who had a Medicare Annual Wellness Visit today via telephone. He reports that he is socially active and does interact with friends/family regularly. He is moderately physically active and he enjoys playing chess.

## 2024-04-29 ENCOUNTER — Other Ambulatory Visit (HOSPITAL_COMMUNITY): Payer: Self-pay | Admitting: Psychiatry

## 2024-05-19 DIAGNOSIS — R29818 Other symptoms and signs involving the nervous system: Secondary | ICD-10-CM | POA: Diagnosis not present

## 2024-05-27 ENCOUNTER — Encounter: Payer: Self-pay | Admitting: Sports Medicine

## 2024-05-28 DIAGNOSIS — R29818 Other symptoms and signs involving the nervous system: Secondary | ICD-10-CM | POA: Diagnosis not present

## 2024-06-11 DIAGNOSIS — R29818 Other symptoms and signs involving the nervous system: Secondary | ICD-10-CM | POA: Diagnosis not present

## 2024-06-19 DIAGNOSIS — H401134 Primary open-angle glaucoma, bilateral, indeterminate stage: Secondary | ICD-10-CM | POA: Diagnosis not present

## 2024-06-19 DIAGNOSIS — Z961 Presence of intraocular lens: Secondary | ICD-10-CM | POA: Diagnosis not present

## 2024-06-29 ENCOUNTER — Ambulatory Visit: Admission: EM | Admit: 2024-06-29 | Discharge: 2024-06-29 | Disposition: A | Discharge status: Home / Self Care

## 2024-06-30 ENCOUNTER — Telehealth: Admitting: Physician Assistant

## 2024-06-30 DIAGNOSIS — H109 Unspecified conjunctivitis: Secondary | ICD-10-CM | POA: Diagnosis not present

## 2024-06-30 MED ORDER — TOBRAMYCIN 0.3 % OP SOLN
2.0000 [drp] | Freq: Four times a day (QID) | OPHTHALMIC | 0 refills | Status: AC
Start: 2024-06-30 — End: 2024-07-05

## 2024-06-30 NOTE — Progress Notes (Signed)
 Virtual Visit Consent   [facility], you are scheduled for a virtual visit with a [place] provider today. Just as with appointments in the office, your consent must be obtained to participate. Your consent will be active for this visit and any virtual visit you may have with one of our providers in the next 365 days. If you have a MyChart account, a copy of this consent can be sent to you electronically.  As this is a virtual visit, video technology does not allow for your provider to perform a traditional examination. This may limit your provider's ability to fully assess your condition. If your provider identifies any concerns that need to be evaluated in person or the need to arrange testing (such as labs, EKG, etc.), we will make arrangements to do so. Although advances in technology are sophisticated, we cannot ensure that it will always work on either your end or our end. If the connection with a video visit is poor, the visit may have to be switched to a telephone visit. With either a video or telephone visit, we are not always able to ensure that we have a secure connection.  By engaging in this virtual visit, you consent to the provision of healthcare and authorize for your insurance to be billed (if applicable) for the services provided during this visit. Depending on your insurance coverage, you may receive a charge related to this service.  I need to obtain your verbal consent now. Are you willing to proceed with your visit today? Zaki Schack has provided verbal consent on 06/30/2024 for a virtual visit (video or telephone). Harlene PEDLAR Ward, PA-C  Date: 06/30/2024 7:32 PM   Virtual Visit via Video Note   I, Harlene PEDLAR Ward, connected with  Eric Margette  ([id number], [id number]) on 06/30/24 at  7:30 PM EDT by a video-enabled telemedicine application and verified that I am speaking with the correct person using two identifiers.  Location: Patient: Virtual Visit Location  Patient: Home Provider: Virtual Visit Location Provider: Home Office   I discussed the limitations of evaluation and management by telemedicine and the availability of in person appointments. The patient expressed understanding and agreed to proceed.    History of Present Illness: Nathan [name] is a 73 y.o. who identifies as a male who was assigned male at birth, and is being seen today for eye irritation, discharge, that started a few days ago.  Denies pain or visual disturbances.  Reports crusting to eyelashes upon waking. He has been using allergy  eye drops with no relief.   HPI: HPI  Problems:  Patient Active Problem List   Diagnosis Date Noted   Elevated BP without diagnosis of hypertension 12/26/2023   Abnormal saccadic eye movement 12/26/2023   Insomnia 08/09/2023   Left sided sciatica 10/26/2022   Sebaceous cyst 07/03/2022   Microalbuminuria due to type 2 diabetes mellitus (HCC) 11/25/2021   Dyspnea 09/01/2021   Primary open angle glaucoma (POAG) of right eye, mild stage 01/29/2020   Combined forms of age-related cataract of left eye 01/22/2020   Osteoarthritis of left knee 01/05/2020   Diabetes mellitus, type II (HCC) 12/08/2016   Pain syndrome, chronic 02/06/2014   Pain in extremity 02/06/2014   Bilateral rotator cuff dysfunction 02/06/2014   Major depressive disorder, recurrent 04/12/2012   Complete tear of left rotator cuff 08/02/2011   CAUSALGIA OF UPPER LIMB 08/02/2010   ANEMIA 05/11/2010   Mononeuritis 08/10/2009   NEPHROLITHIASIS 05/22/2009   GERD 06/23/2008   Allergic rhinitis  01/14/2008   Hyperlipidemia 06/28/2007   Fatty liver 06/28/2007   Congenital cystic kidney disease 06/28/2007    Allergies:  Allergies  Allergen Reactions   Latex Rash   Adhesive [Tape]     Hives on area wear tape applied   Hydrocodone -Acetaminophen  Itching   Medications:  Current Outpatient Medications:    tobramycin (TOBREX) 0.3 % ophthalmic solution, Place 2 drops into both  eyes every 6 (six) hours for 5 days., Disp: 5 mL, Rfl: 0   atorvastatin  (LIPITOR) 40 MG tablet, Take 1 tablet (40 mg total) by mouth daily., Disp: 90 tablet, Rfl: 3   Blood Glucose Monitoring Suppl DEVI, May substitute to any manufacturer covered by patient's insurance.testing blood sugars daily. E11.9, Disp: 1 each, Rfl: 0   busPIRone  (BUSPAR ) 10 MG tablet, Take 1 tablet (10 mg total) by mouth daily., Disp: 30 tablet, Rfl: 1   Glucose Blood (BLOOD GLUCOSE TEST STRIPS) STRP, May substitute to any manufacturer covered by patient's insurance.for testing blood sugars daily. E11.9, Disp: 100 strip, Rfl: 3   Lancets Misc. MISC, May substitute to any manufacturer covered by patient's insurance.testing blood sugars daily. E11.9, Disp: 100 each, Rfl: 3   losartan  (COZAAR ) 50 MG tablet, Take 1 tablet (50 mg total) by mouth daily. TAKE 1 TABLET BY MOUTH ONCE DAILY FOR  MICROALBUMINURIA, Disp: 90 tablet, Rfl: 3   meloxicam  (MOBIC ) 15 MG tablet, TAKE 1 TABLET BY MOUTH IN THE MORNING WITH MEALS FOR 2 WEEKS THEN ONCE DAILY AS NEEDED, Disp: 30 tablet, Rfl: 0   Multiple Vitamins-Minerals (CENTRUM ADULTS PO), Take 1 tablet by mouth daily., Disp: , Rfl:    sertraline  (ZOLOFT ) 50 MG tablet, Take 1 tablet by mouth once daily, Disp: 30 tablet, Rfl: 2   traZODone  (DESYREL ) 50 MG tablet, Take 1 tablet (50 mg total) by mouth at bedtime., Disp: 30 tablet, Rfl: 1  Observations/Objective: Patient is well-developed, well-nourished in no acute distress.  Resting comfortably at home.  Head is normocephalic, atraumatic.  No labored breathing.  Speech is clear and coherent with logical content.  Patient is alert and oriented at baseline.    Assessment and Plan: 1. Bacterial conjunctivitis (Primary)  Will prescribed antibiotic eye drops.   Follow Up Instructions: I discussed the assessment and treatment plan with the patient. The patient was provided an opportunity to ask questions and all were answered. The patient  agreed with the plan and demonstrated an understanding of the instructions.  A copy of instructions were sent to the patient via MyChart unless otherwise noted below.     The patient was advised to call back or seek an in-person evaluation if the symptoms worsen or if the condition fails to improve as anticipated.    Harlene PEDLAR Ward, PA-C

## 2024-06-30 NOTE — Patient Instructions (Signed)
 [facility], thank you for joining [employer], PA-C for today's virtual visit.  While this provider is not your primary care provider (PCP), if your PCP is located in our provider database this encounter information will be shared with them immediately following your visit.   A [facility] MyChart account gives you access to today's visit and all your visits, tests, and labs performed at [facility]  click here if you don't have a [facility] MyChart account or go to [email or web address]  Consent: (Patient) Nathan [name] provided verbal consent for this virtual visit at the beginning of the encounter.  Current Medications:  Current Outpatient Medications:    tobramycin (TOBREX) 0.3 % ophthalmic solution, Place 2 drops into both eyes every 6 (six) hours for 5 days., Disp: 5 mL, Rfl: 0   atorvastatin  (LIPITOR) 40 MG tablet, Take 1 tablet (40 mg total) by mouth daily., Disp: 90 tablet, Rfl: 3   Blood Glucose Monitoring Suppl DEVI, May substitute to any manufacturer covered by patient's insurance.testing blood sugars daily. E11.9, Disp: 1 each, Rfl: 0   busPIRone  (BUSPAR ) 10 MG tablet, Take 1 tablet (10 mg total) by mouth daily., Disp: 30 tablet, Rfl: 1   Glucose Blood (BLOOD GLUCOSE TEST STRIPS) STRP, May substitute to any manufacturer covered by patient's insurance.for testing blood sugars daily. E11.9, Disp: 100 strip, Rfl: 3   Lancets Misc. MISC, May substitute to any manufacturer covered by patient's insurance.testing blood sugars daily. E11.9, Disp: 100 each, Rfl: 3   losartan  (COZAAR ) 50 MG tablet, Take 1 tablet (50 mg total) by mouth daily. TAKE 1 TABLET BY MOUTH ONCE DAILY FOR  MICROALBUMINURIA, Disp: 90 tablet, Rfl: 3   meloxicam  (MOBIC ) 15 MG tablet, TAKE 1 TABLET BY MOUTH IN THE MORNING WITH MEALS FOR 2 WEEKS THEN ONCE DAILY AS NEEDED, Disp: 30 tablet, Rfl: 0   Multiple Vitamins-Minerals (CENTRUM ADULTS PO), Take 1 tablet by mouth daily., Disp: , Rfl:     sertraline  (ZOLOFT ) 50 MG tablet, Take 1 tablet by mouth once daily, Disp: 30 tablet, Rfl: 2   traZODone  (DESYREL ) 50 MG tablet, Take 1 tablet (50 mg total) by mouth at bedtime., Disp: 30 tablet, Rfl: 1   Medications ordered in this encounter:  Meds ordered this encounter  Medications   tobramycin (TOBREX) 0.3 % ophthalmic solution    Sig: Place 2 drops into both eyes every 6 (six) hours for 5 days.    Dispense:  5 mL    Refill:  0    Supervising Provider:   BLAISE ALEENE KIDD [[id number]]     *If you need refills on other medications prior to your next appointment, please contact your pharmacy*  Follow-Up: Call back or seek an in-person evaluation if the symptoms worsen or if the condition fails to improve as anticipated.  [facility] [phone]  Other Instructions Use eye drops as prescribed.  If no improvement follow up with ophthalmologist.    If you have been instructed to have an in-person evaluation today at a local Urgent Care facility, please use the link below. It will take you to a list of all of our available [facility], including address, phone number and hours of operation. Please do not delay care.  [facility]  If you or a family member do not have a primary care provider, use the link below to schedule a visit and establish care. When you choose a [facility] primary care physician or advanced practice  provider, you gain a long-term partner in health. Find a Primary Care Provider  Learn more about [facility] in-office and virtual care options: [facility] Get Care Now

## 2024-07-06 ENCOUNTER — Other Ambulatory Visit: Payer: Self-pay

## 2024-07-06 ENCOUNTER — Ambulatory Visit
Admission: EM | Admit: 2024-07-06 | Discharge: 2024-07-06 | Disposition: A | Discharge status: Home / Self Care | Attending: Family Medicine | Admitting: Family Medicine

## 2024-07-06 DIAGNOSIS — H5789 Other specified disorders of eye and adnexa: Secondary | ICD-10-CM

## 2024-07-06 DIAGNOSIS — H53143 Visual discomfort, bilateral: Secondary | ICD-10-CM

## 2024-07-06 MED ORDER — PREDNISONE 20 MG PO TABS: ORAL_TABLET | ORAL | 0 refills | Status: DC

## 2024-07-06 MED ORDER — ERYTHROMYCIN 5 MG/GM OP OINT: 1.0000 | TOPICAL_OINTMENT | Freq: Three times a day (TID) | OPHTHALMIC | 0 refills | Status: DC

## 2024-07-06 NOTE — ED Provider Notes (Signed)
 [facility]    CSN: [id number] Arrival date & time: 07/06/24  1301      History   Chief Complaint No chief complaint on file.   HPI Nathan [name] is a 73 y.o. male.   Patient states he is here for eye irritation.  They have been feeling sanding grainy for the last 2 weeks.  It burns when he tries to use his eyedrops.  The only thing that does not hurt he is refresh lubricant drops.  He has tried his usual allergy  eyedrops Opcon-A.  He has tried several days of tobramycin eyedrops prescribed by video medicine.  In spite of this his eyes continue to feel grainy and irritated, purulent discharge, severely photophobic.  Describes he has pain in his eyes when he tries to look out of a window when sitting indoors History of glaucoma and is on drops History of abnormal saccadic eye movement unclear etiology.  Normal neurologic workup    Past Medical History:  Diagnosis Date   Adenomatous colon polyp    Allergy     Anxiety    CRPS (complex regional pain syndrome), upper limb    Depression    Diabetes mellitus without complication (HCC)    Diverticulitis 2013   Diverticulosis    Diverticulosis    GERD (gastroesophageal reflux disease)    Glaucoma    History of Helicobacter pylori infection    Hyperlipidemia    Hypertension    no per pt   Intestinal metaplasia of gastric mucosa    Ligament tear of upper extremity April 2008   Left elbow   Nephrolithiasis    Renal calculi    Renal disorder     Patient Active Problem List   Diagnosis Date Noted   Elevated BP without diagnosis of hypertension 12/26/2023   Abnormal saccadic eye movement 12/26/2023   Insomnia 08/09/2023   Left sided sciatica 10/26/2022   Sebaceous cyst 07/03/2022   Microalbuminuria due to type 2 diabetes mellitus (HCC) 11/25/2021   Dyspnea 09/01/2021   Primary open angle glaucoma (POAG) of right eye, mild stage 01/29/2020   Combined forms of age-related cataract of left eye 01/22/2020    Osteoarthritis of left knee 01/05/2020   Diabetes mellitus, type II (HCC) 12/08/2016   Pain syndrome, chronic 02/06/2014   Pain in extremity 02/06/2014   Bilateral rotator cuff dysfunction 02/06/2014   Major depressive disorder, recurrent 04/12/2012   Complete tear of left rotator cuff 08/02/2011   CAUSALGIA OF UPPER LIMB 08/02/2010   ANEMIA 05/11/2010   Mononeuritis 08/10/2009   NEPHROLITHIASIS 05/22/2009   GERD 06/23/2008   Allergic rhinitis 01/14/2008   Hyperlipidemia 06/28/2007   Fatty liver 06/28/2007   Congenital cystic kidney disease 06/28/2007    Past Surgical History:  Procedure Laterality Date   LITHOTRIPSY  09/25/2002   kidney stones   REPLACEMENT TOTAL KNEE Right 09/25/2012   REPLACEMENT TOTAL KNEE Left 09/07/2023   ROTATOR CUFF REPAIR Left 09/25/2009   ULNAR NERVE REPAIR Left 2009, 2010,   3 surgeries       Home Medications    Prior to Admission medications   Medication Sig Start Date End Date Taking? Authorizing Provider  dorzolamide (TRUSOPT) 2 % ophthalmic solution 1 drop 3 (three) times daily. With timolol   Yes [provider]  dorzolamide-timolol (COSOPT) 2-0.5 % ophthalmic solution 1 drop 2 (two) times daily.   Yes [provider]  erythromycin ophthalmic ointment Place 1 Application into both eyes 3 (three) times daily. Place a 1/4 inch  ribbon of ointment into the lower eyelid. 07/06/24  Yes Maranda Jamee Jacob, MD  latanoprost (XALATAN) 0.005 % ophthalmic solution 1 drop at bedtime.   Yes [provider]  predniSONE  (DELTASONE ) 20 MG tablet Take 2 pills today, then 1 a day for 5 days 07/06/24  Yes Maranda Jamee Jacob, MD  atorvastatin  (LIPITOR) 40 MG tablet Take 1 tablet (40 mg total) by mouth daily. 12/26/23   Alvan Dorothyann BIRCH, MD  Blood Glucose Monitoring Suppl DEVI May substitute to any manufacturer covered by patient's insurance.testing blood sugars daily. E11.9 03/26/24   Alvan Dorothyann BIRCH, MD  busPIRone  (BUSPAR ) 10  MG tablet Take 1 tablet (10 mg total) by mouth daily. 04/08/24   Geralene Kaiser, MD  Glucose Blood (BLOOD GLUCOSE TEST STRIPS) STRP May substitute to any manufacturer covered by patient's insurance.for testing blood sugars daily. E11.9 03/26/24   Alvan Dorothyann BIRCH, MD  Lancets Misc. MISC May substitute to any manufacturer covered by patient's insurance.testing blood sugars daily. E11.9 03/26/24   Alvan Dorothyann BIRCH, MD  losartan  (COZAAR ) 50 MG tablet Take 1 tablet (50 mg total) by mouth daily. TAKE 1 TABLET BY MOUTH ONCE DAILY FOR  MICROALBUMINURIA 01/24/24   Alvan Dorothyann BIRCH, MD  meloxicam  (MOBIC ) 15 MG tablet TAKE 1 TABLET BY MOUTH IN THE MORNING WITH MEALS FOR 2 WEEKS THEN ONCE DAILY AS NEEDED 04/11/23   Curtis Debby PARAS, MD  Multiple Vitamins-Minerals (CENTRUM ADULTS PO) Take 1 tablet by mouth daily.    [provider]  sertraline  (ZOLOFT ) 50 MG tablet Take 1 tablet by mouth once daily 04/29/24   Geralene Kaiser, MD  traZODone  (DESYREL ) 50 MG tablet Take 1 tablet (50 mg total) by mouth at bedtime. 04/08/24   Geralene Kaiser, MD    Family History Family History  Problem Relation Age of Onset   Kidney disease Mother    Lung cancer Father 59   Colon cancer Neg Hx    Esophageal cancer Neg Hx    Rectal cancer Neg Hx    Stomach cancer Neg Hx    Colon polyps Neg Hx     Social History Social History   Tobacco Use   Smoking status: Former    Current packs/day: 0.00    Average packs/day: 1 pack/day for 10.0 years (10.0 ttl pk-yrs)    Types: Cigarettes    Start date: 09/26/1971    Quit date: 09/25/1981    Years since quitting: 42.8   Smokeless tobacco: Never  Vaping Use   Vaping status: Never Used  Substance Use Topics   Alcohol use: Not Currently    Alcohol/week: 0.0 - 2.0 standard drinks of alcohol   Drug use: No     Allergies   Latex, Adhesive [tape], and Hydrocodone -acetaminophen    Review of Systems Review of Systems  See HPI Physical Exam Triage Vital  Signs ED Triage Vitals  Encounter Vitals Group     BP 07/06/24 1310 (!) 156/75     Girls Systolic BP Percentile --      Girls Diastolic BP Percentile --      Boys Systolic BP Percentile --      Boys Diastolic BP Percentile --      Pulse Rate 07/06/24 1310 69     Resp 07/06/24 1310 16     Temp 07/06/24 1310 98 F (36.7 C)     Temp src --      SpO2 07/06/24 1310 96 %     Weight --  Height --      Head Circumference --      Peak Flow --      Pain Score 07/06/24 1315 1     Pain Loc --      Pain Education --      Exclude from Growth Chart --    No data found.  Updated Vital Signs BP (!) 156/75   Pulse 69   Temp 98 F (36.7 C)   Resp 16   SpO2 96%       Physical Exam Constitutional:      General: He is not in acute distress.    Appearance: He is well-developed.  HENT:     Head: Normocephalic and atraumatic.  Eyes:     Pupils: Pupils are equal, round, and reactive to light.     Comments: Very mild on the inside of the lower lid bilaterally.  Cobblestoning present.  No discharge is identified.  No conjunctival injection.  Photo phobia is noted  Cardiovascular:     Rate and Rhythm: Normal rate.  Pulmonary:     Effort: Pulmonary effort is normal. No respiratory distress.  Abdominal:     General: There is no distension.     Palpations: Abdomen is soft.  Musculoskeletal:        General: Normal range of motion.     Cervical back: Normal range of motion.  Skin:    General: Skin is warm and dry.  Neurological:     Mental Status: He is alert.      UC Treatments / Results  Labs (all labs ordered are listed, but only abnormal results are displayed) Labs Reviewed - No data to display  EKG   Radiology No results found.  Procedures Procedures (including critical care time)  Medications Ordered in UC Medications - No data to display  Initial Impression / Assessment and Plan / UC Course  I have reviewed the triage vital signs and the nursing  notes.  Pertinent labs & imaging results that were available during my care of the patient were reviewed by me and considered in my medical decision making (see chart for details).     Clinically the eyes look mostly like a blepharitis.  Do not know why it is not getting better on tobramycin.  Do not usually have such photophobia with blepharitis.  Have told patient he needs to follow-up with his eye doctor. Final Clinical Impressions(s) / UC Diagnoses   Final diagnoses:  Irritation of both eyes  Photophobia of both eyes     Discharge Instructions      Take prednisone  daily for 5 days.  Prednisone  is a potent anti-inflammatory to help with allergy  and inflammatory symptoms.  It will help reduce the light sensitivity in your eyes Use the erythromycin ointment 2-3 times a day for the next week See your eye doctor if the symptoms do not improve   ED Prescriptions     Medication Sig Dispense Auth. Provider   predniSONE  (DELTASONE ) 20 MG tablet Take 2 pills today, then 1 a day for 5 days 7 tablet Maranda Jamee Jacob, MD   erythromycin ophthalmic ointment Place 1 Application into both eyes 3 (three) times daily. Place a 1/4 inch ribbon of ointment into the lower eyelid. 1 g Maranda Jamee Jacob, MD      PDMP not reviewed this encounter.   Maranda Jamee Jacob, MD 07/06/24 1356

## 2024-07-06 NOTE — Discharge Instructions (Signed)
 Take prednisone  daily for 5 days.  Prednisone  is a potent anti-inflammatory to help with allergy  and inflammatory symptoms.  It will help reduce the light sensitivity in your eyes Use the erythromycin ointment 2-3 times a day for the next week See your eye doctor if the symptoms do not improve

## 2024-07-06 NOTE — ED Triage Notes (Signed)
 Bil eye pain and itching x 2 weeks. Has noticed drainage. Has been using Rephresh drops, tobramycin drops, Opcon A. Had teledoc appt last Tuesday and got tobramycin drops then.

## 2024-07-18 DIAGNOSIS — H401134 Primary open-angle glaucoma, bilateral, indeterminate stage: Secondary | ICD-10-CM | POA: Diagnosis not present

## 2024-07-22 ENCOUNTER — Other Ambulatory Visit (HOSPITAL_COMMUNITY): Payer: Self-pay | Admitting: Psychiatry

## 2024-07-23 ENCOUNTER — Other Ambulatory Visit: Payer: Self-pay | Admitting: Medical Genetics

## 2024-07-28 ENCOUNTER — Ambulatory Visit: Admitting: Family Medicine

## 2024-07-28 ENCOUNTER — Encounter: Payer: Self-pay | Admitting: Family Medicine

## 2024-07-28 VITALS — BP 126/64 | HR 71 | Ht 66.0 in | Wt 168.1 lb

## 2024-07-28 DIAGNOSIS — R030 Elevated blood-pressure reading, without diagnosis of hypertension: Secondary | ICD-10-CM | POA: Diagnosis not present

## 2024-07-28 DIAGNOSIS — K760 Fatty (change of) liver, not elsewhere classified: Secondary | ICD-10-CM | POA: Diagnosis not present

## 2024-07-28 DIAGNOSIS — E119 Type 2 diabetes mellitus without complications: Secondary | ICD-10-CM

## 2024-07-28 DIAGNOSIS — R809 Proteinuria, unspecified: Secondary | ICD-10-CM | POA: Diagnosis not present

## 2024-07-28 DIAGNOSIS — H01005 Unspecified blepharitis left lower eyelid: Secondary | ICD-10-CM

## 2024-07-28 DIAGNOSIS — E1129 Type 2 diabetes mellitus with other diabetic kidney complication: Secondary | ICD-10-CM | POA: Diagnosis not present

## 2024-07-28 DIAGNOSIS — E785 Hyperlipidemia, unspecified: Secondary | ICD-10-CM

## 2024-07-28 DIAGNOSIS — H01002 Unspecified blepharitis right lower eyelid: Secondary | ICD-10-CM

## 2024-07-28 LAB — POCT GLYCOSYLATED HEMOGLOBIN (HGB A1C): Hemoglobin A1C: 6.4 % — AB (ref 4.0–5.6)

## 2024-07-28 NOTE — Assessment & Plan Note (Signed)
 Taking statin without any problems.  Due for updated lipid panel.

## 2024-07-28 NOTE — Assessment & Plan Note (Signed)
 Recheck today

## 2024-07-28 NOTE — Progress Notes (Signed)
 Established Patient Office Visit  Patient ID: Nathan [name], male    DOB: 1951/01/14  Age: 72 y.o. MRN: [id number] PCP: Alvan Dorothyann BIRCH, MD  Chief Complaint  Patient presents with   Diabetes   Hypertension    Subjective:     HPI  Discussed the use of AI scribe software for clinical note transcription with the patient, who gave verbal consent to proceed.  History of Present Illness Nathan [name] is a 73 year old male with advanced glaucoma who presents with eye irritation and medication intolerance.  Ocular irritation and medication intolerance - Advanced glaucoma diagnosed recently after previously stable intraocular pressure post-surgical intervention and implants (4-5 years ago) - Prescribed two eye drops by specialist; developed significant burning and itching with one, resulting in an eye infection - Eye infection treated with amiodarone - Persistent symptoms include itching, burning, fluid sensation in the eyes, occasional blurriness, redness, and swelling of the lower eyelids - Currently using only latanoprost at night due to allergic reaction to the other prescribed drop - Systane lubricant drops provide partial relief  Glycemic control - A1c increased to 6.4 from previous values in the fives - No significant changes in diet or exercise routine - Continues losartan  50 mg and atorvastatin  - Mindful of carbohydrate intake, avoids sweets, and limits pasta and bread     ROS    Objective:     BP 126/64   Pulse 71   Ht 5' 6 (1.676 m)   Wt 168 lb 1.3 oz (76.2 kg)   SpO2 99%   BMI 27.13 kg/m    Physical Exam Vitals and nursing note reviewed.  Constitutional:      Appearance: Normal appearance.  HENT:     Head: Normocephalic and atraumatic.  Eyes:     Conjunctiva/sclera: Conjunctivae normal.  Cardiovascular:     Rate and Rhythm: Normal rate and regular rhythm.  Pulmonary:     Effort: Pulmonary effort is normal.     Breath sounds: Normal  breath sounds.  Skin:    General: Skin is warm and dry.  Neurological:     Mental Status: He is alert.  Psychiatric:        Mood and Affect: Mood normal.      Results for orders placed or performed in visit on 07/28/24  POCT HgB A1C  Result Value Ref Range   Hemoglobin A1C 6.4 (A) 4.0 - 5.6 %   HbA1c POC (<> result, manual entry)     HbA1c, POC (prediabetic range)     HbA1c, POC (controlled diabetic range)        The 10-year ASCVD risk score (Arnett DK, et al., 2019) is: 40.1%    Assessment & Plan:   Problem List Items Addressed This Visit       Digestive   Fatty liver   Continue to monitor liver function      Relevant Orders   CMP14+EGFR     Endocrine   Microalbuminuria due to type 2 diabetes mellitus (HCC) - Primary   Continue ARB for renal protection.  Will get microalbumin done today.  Type 2 diabetes with A1c increased to 6.4, likely due to steroid use. No significant diet or exercise changes. Aim to maintain A1c to avoid medication. - Continue losartan  50 mg and atorvastatin . - Encourage low carbohydrate diet, portion control, increased vegetables and lean protein. - Monitor A1c to keep below 6.5.      Relevant Orders   POCT HgB A1C (Completed)   Urine  Microalbumin w/creat. ratio   CMP14+EGFR   Lipid Panel With LDL/HDL Ratio     Other   Hyperlipidemia   Taking statin without any problems.  Due for updated lipid panel.      Relevant Orders   POCT HgB A1C (Completed)   Urine Microalbumin w/creat. ratio   CMP14+EGFR   Lipid Panel With LDL/HDL Ratio   Elevated BP without diagnosis of hypertension   Recheck today       Other Visit Diagnoses       Blepharitis of lower eyelids of both eyes, unspecified type           Assessment and Plan Assessment & Plan Advanced glaucoma Advanced glaucoma with previous surgery and stent, managed with eye drops. Recent intolerance to one drop caused burning, itching, and infection treated with amiodarone.  Symptoms include fluid sensation, blurry vision, and itching. Awaiting specialist follow-up in December. - Continue latanoprost eye drop at night. - Consider Pataday  in the morning for itching, pending safety confirmation. - Use Systane drops as needed for blurry vision and discomfort.  Dry eye syndrome with blepharitis Dry eye syndrome with blepharitis causing red, swollen eyelids and blurry vision, worsened by previous eye drops. - Use Systane drops multiple times daily. - Consider Pataday  if confirmed safe.      Return in about 4 months (around 11/25/2024) for Diabetes follow-up.    Dorothyann Byars, MD [facility] at [facility]

## 2024-07-28 NOTE — Assessment & Plan Note (Signed)
 Continue to monitor liver function

## 2024-07-28 NOTE — Patient Instructions (Addendum)
 Please try the Pataday  drop, 1 drop in each eye in the morning for 10-14 days and see if helps.

## 2024-07-28 NOTE — Assessment & Plan Note (Addendum)
 Continue ARB for renal protection.  Will get microalbumin done today.  Type 2 diabetes with A1c increased to 6.4, likely due to steroid use. No significant diet or exercise changes. Aim to maintain A1c to avoid medication. - Continue losartan  50 mg and atorvastatin . - Encourage low carbohydrate diet, portion control, increased vegetables and lean protein. - Monitor A1c to keep below 6.5.

## 2024-07-29 LAB — LIPID PANEL WITH LDL/HDL RATIO
Cholesterol, Total: 137 mg/dL (ref 100–199)
HDL: 46 mg/dL (ref >39)
LDL Chol Calc (NIH): 48 mg/dL (ref 0–99)
LDL/HDL Ratio: 1 ratio (ref 0.0–3.6)
Triglycerides: 276 mg/dL — ABNORMAL HIGH (ref 0–149)
VLDL Cholesterol Cal: 43 mg/dL — ABNORMAL HIGH (ref 5–40)

## 2024-07-29 LAB — CMP14+EGFR
ALT: 21 IU/L (ref 0–44)
AST: 23 IU/L (ref 0–40)
Albumin: 4.7 g/dL (ref 3.8–4.8)
Alkaline Phosphatase: 108 IU/L (ref 47–123)
BUN/Creatinine Ratio: 18 (ref 10–24)
BUN: 16 mg/dL (ref 8–27)
Bilirubin Total: 1.1 mg/dL (ref 0.0–1.2)
CO2: 23 mmol/L (ref 20–29)
Calcium: 9.8 mg/dL (ref 8.6–10.2)
Chloride: 100 mmol/L (ref 96–106)
Creatinine, Ser: 0.88 mg/dL (ref 0.76–1.27)
Globulin, Total: 2.9 g/dL (ref 1.5–4.5)
Glucose: 111 mg/dL — ABNORMAL HIGH (ref 70–99)
Potassium: 4 mmol/L (ref 3.5–5.2)
Sodium: 138 mmol/L (ref 134–144)
Total Protein: 7.6 g/dL (ref 6.0–8.5)
eGFR: 91 mL/min/1.73 (ref >59)

## 2024-07-29 LAB — MICROALBUMIN / CREATININE URINE RATIO
Creatinine, Urine: 74.3 mg/dL
Microalb/Creat Ratio: 110 mg/g{creat} — ABNORMAL HIGH (ref 0–29)
Microalbumin, Urine: 81.4 ug/mL

## 2024-07-30 ENCOUNTER — Ambulatory Visit: Payer: Self-pay | Admitting: Family Medicine

## 2024-07-30 NOTE — Progress Notes (Signed)
 HI [place],  You are still spilling some extra protein in the urine up a little bit from last time.  Kidney function is stable though and liver function looks great.  Please keep taking the losartan  that helps with the proteinuria.  Total triglycerides are still up.  LDL looks great though.

## 2024-08-22 ENCOUNTER — Other Ambulatory Visit (HOSPITAL_COMMUNITY): Payer: Self-pay | Admitting: Psychiatry

## 2024-09-19 ENCOUNTER — Other Ambulatory Visit (HOSPITAL_COMMUNITY): Payer: Self-pay | Admitting: Psychiatry

## 2024-10-07 ENCOUNTER — Ambulatory Visit (HOSPITAL_COMMUNITY): Admitting: Psychiatry

## 2024-10-18 ENCOUNTER — Other Ambulatory Visit (HOSPITAL_COMMUNITY): Payer: Self-pay | Admitting: Psychiatry

## 2024-11-11 ENCOUNTER — Ambulatory Visit (HOSPITAL_COMMUNITY): Admitting: Psychiatry

## 2024-11-25 ENCOUNTER — Ambulatory Visit: Admitting: Family Medicine

## 2025-04-16 ENCOUNTER — Ambulatory Visit
